# Patient Record
Sex: Male | Born: 1955 | Race: White | Hispanic: No | Marital: Single | Smoking: Current every day smoker
Health system: Southern US, Community
[De-identification: ages and names within clinical notes are randomized; demographics above are authoritative.]

## PROBLEM LIST (undated history)

## (undated) DIAGNOSIS — I714 Abdominal aortic aneurysm, without rupture, unspecified: Secondary | ICD-10-CM

## (undated) DIAGNOSIS — C649 Malignant neoplasm of unspecified kidney, except renal pelvis: Secondary | ICD-10-CM

## (undated) DIAGNOSIS — Z951 Presence of aortocoronary bypass graft: Secondary | ICD-10-CM

## (undated) DIAGNOSIS — F329 Major depressive disorder, single episode, unspecified: Secondary | ICD-10-CM

## (undated) DIAGNOSIS — I10 Essential (primary) hypertension: Secondary | ICD-10-CM

## (undated) DIAGNOSIS — Z95810 Presence of automatic (implantable) cardiac defibrillator: Secondary | ICD-10-CM

## (undated) DIAGNOSIS — N529 Male erectile dysfunction, unspecified: Secondary | ICD-10-CM

## (undated) DIAGNOSIS — J189 Pneumonia, unspecified organism: Secondary | ICD-10-CM

## (undated) DIAGNOSIS — F32A Depression, unspecified: Secondary | ICD-10-CM

## (undated) DIAGNOSIS — K219 Gastro-esophageal reflux disease without esophagitis: Secondary | ICD-10-CM

## (undated) DIAGNOSIS — I77810 Thoracic aortic ectasia: Secondary | ICD-10-CM

## (undated) DIAGNOSIS — I951 Orthostatic hypotension: Secondary | ICD-10-CM

## (undated) DIAGNOSIS — R0600 Dyspnea, unspecified: Secondary | ICD-10-CM

## (undated) DIAGNOSIS — I351 Nonrheumatic aortic (valve) insufficiency: Secondary | ICD-10-CM

## (undated) DIAGNOSIS — I255 Ischemic cardiomyopathy: Secondary | ICD-10-CM

## (undated) DIAGNOSIS — I472 Ventricular tachycardia, unspecified: Secondary | ICD-10-CM

## (undated) DIAGNOSIS — E785 Hyperlipidemia, unspecified: Secondary | ICD-10-CM

## (undated) DIAGNOSIS — M1990 Unspecified osteoarthritis, unspecified site: Secondary | ICD-10-CM

## (undated) DIAGNOSIS — M75100 Unspecified rotator cuff tear or rupture of unspecified shoulder, not specified as traumatic: Secondary | ICD-10-CM

## (undated) DIAGNOSIS — F419 Anxiety disorder, unspecified: Secondary | ICD-10-CM

## (undated) DIAGNOSIS — I5042 Chronic combined systolic (congestive) and diastolic (congestive) heart failure: Secondary | ICD-10-CM

## (undated) DIAGNOSIS — I2510 Atherosclerotic heart disease of native coronary artery without angina pectoris: Secondary | ICD-10-CM

## (undated) DIAGNOSIS — R519 Headache, unspecified: Secondary | ICD-10-CM

## (undated) DIAGNOSIS — R51 Headache: Secondary | ICD-10-CM

## (undated) DIAGNOSIS — G2581 Restless legs syndrome: Secondary | ICD-10-CM

## (undated) HISTORY — PX: COLONOSCOPY: SHX174

## (undated) HISTORY — DX: Thoracic aortic ectasia: I77.810

## (undated) HISTORY — DX: Unspecified rotator cuff tear or rupture of unspecified shoulder, not specified as traumatic: M75.100

## (undated) HISTORY — DX: Major depressive disorder, single episode, unspecified: F32.9

## (undated) HISTORY — DX: Malignant neoplasm of unspecified kidney, except renal pelvis: C64.9

## (undated) HISTORY — DX: Ischemic cardiomyopathy: I25.5

## (undated) HISTORY — PX: CARDIAC CATHETERIZATION: SHX172

## (undated) HISTORY — DX: Nonrheumatic aortic (valve) insufficiency: I35.1

## (undated) HISTORY — DX: Atherosclerotic heart disease of native coronary artery without angina pectoris: I25.10

## (undated) HISTORY — DX: Abdominal aortic aneurysm, without rupture, unspecified: I71.40

## (undated) HISTORY — PX: OTHER SURGICAL HISTORY: SHX169

## (undated) HISTORY — DX: Presence of aortocoronary bypass graft: Z95.1

## (undated) HISTORY — DX: Male erectile dysfunction, unspecified: N52.9

## (undated) HISTORY — DX: Ventricular tachycardia: I47.2

## (undated) HISTORY — DX: Depression, unspecified: F32.A

## (undated) HISTORY — PX: ROTATOR CUFF REPAIR: SHX139

## (undated) HISTORY — DX: Chronic combined systolic (congestive) and diastolic (congestive) heart failure: I50.42

## (undated) HISTORY — DX: Abdominal aortic aneurysm, without rupture: I71.4

## (undated) HISTORY — DX: Orthostatic hypotension: I95.1

## (undated) HISTORY — PX: CORONARY STENT PLACEMENT: SHX1402

## (undated) HISTORY — DX: Ventricular tachycardia, unspecified: I47.20

## (undated) HISTORY — DX: Hyperlipidemia, unspecified: E78.5

---

## 2003-01-15 HISTORY — PX: CORONARY ARTERY BYPASS GRAFT: SHX141

## 2010-10-19 ENCOUNTER — Emergency Department (HOSPITAL_COMMUNITY)
Admission: EM | Admit: 2010-10-19 | Discharge: 2010-10-20 | Disposition: A | Discharge status: Home / Self Care | Payer: Federal, State, Local not specified - PPO | Attending: Emergency Medicine | Admitting: Emergency Medicine

## 2010-10-19 DIAGNOSIS — Z951 Presence of aortocoronary bypass graft: Secondary | ICD-10-CM | POA: Insufficient documentation

## 2010-10-19 DIAGNOSIS — Z79899 Other long term (current) drug therapy: Secondary | ICD-10-CM | POA: Insufficient documentation

## 2010-10-19 DIAGNOSIS — Z760 Encounter for issue of repeat prescription: Secondary | ICD-10-CM | POA: Insufficient documentation

## 2010-10-19 DIAGNOSIS — F3289 Other specified depressive episodes: Secondary | ICD-10-CM | POA: Insufficient documentation

## 2010-10-19 DIAGNOSIS — F329 Major depressive disorder, single episode, unspecified: Secondary | ICD-10-CM | POA: Insufficient documentation

## 2010-10-20 LAB — POCT I-STAT, CHEM 8
BUN: 14 mg/dL (ref 6–23)
Chloride: 104 mEq/L (ref 96–112)
Creatinine, Ser: 1 mg/dL (ref 0.50–1.35)
Potassium: 4.1 mEq/L (ref 3.5–5.1)
Sodium: 140 mEq/L (ref 135–145)

## 2010-10-20 LAB — DIFFERENTIAL
Lymphocytes Relative: 22 % (ref 12–46)
Monocytes Absolute: 0.8 10*3/uL (ref 0.1–1.0)
Monocytes Relative: 8 % (ref 3–12)
Neutro Abs: 7.2 10*3/uL (ref 1.7–7.7)
Neutrophils Relative %: 68 % (ref 43–77)

## 2010-10-20 LAB — CBC
HCT: 49.9 % (ref 39.0–52.0)
Hemoglobin: 17.4 g/dL — ABNORMAL HIGH (ref 13.0–17.0)
MCH: 30.8 pg (ref 26.0–34.0)
MCHC: 34.9 g/dL (ref 30.0–36.0)
RBC: 5.65 MIL/uL (ref 4.22–5.81)

## 2012-09-22 ENCOUNTER — Telehealth: Payer: Self-pay | Admitting: Oncology

## 2012-09-22 NOTE — Telephone Encounter (Signed)
LVOM FOR PT TO RETURN CALL IN RE TO REFERRAL.  °

## 2012-10-05 ENCOUNTER — Encounter: Payer: Self-pay | Admitting: *Deleted

## 2012-10-05 ENCOUNTER — Encounter: Payer: Self-pay | Admitting: Cardiology

## 2012-10-05 ENCOUNTER — Ambulatory Visit: Payer: Federal, State, Local not specified - PPO | Admitting: Internal Medicine

## 2012-10-05 DIAGNOSIS — F329 Major depressive disorder, single episode, unspecified: Secondary | ICD-10-CM | POA: Insufficient documentation

## 2012-10-05 DIAGNOSIS — F32A Depression, unspecified: Secondary | ICD-10-CM | POA: Insufficient documentation

## 2012-10-05 DIAGNOSIS — N529 Male erectile dysfunction, unspecified: Secondary | ICD-10-CM | POA: Insufficient documentation

## 2012-10-05 DIAGNOSIS — C649 Malignant neoplasm of unspecified kidney, except renal pelvis: Secondary | ICD-10-CM | POA: Insufficient documentation

## 2012-10-05 DIAGNOSIS — I2510 Atherosclerotic heart disease of native coronary artery without angina pectoris: Secondary | ICD-10-CM | POA: Insufficient documentation

## 2012-10-05 DIAGNOSIS — M75100 Unspecified rotator cuff tear or rupture of unspecified shoulder, not specified as traumatic: Secondary | ICD-10-CM | POA: Insufficient documentation

## 2012-10-05 DIAGNOSIS — E785 Hyperlipidemia, unspecified: Secondary | ICD-10-CM | POA: Insufficient documentation

## 2012-10-12 ENCOUNTER — Other Ambulatory Visit (HOSPITAL_COMMUNITY): Payer: Self-pay | Admitting: Cardiology

## 2012-10-12 DIAGNOSIS — I259 Chronic ischemic heart disease, unspecified: Secondary | ICD-10-CM

## 2012-10-15 ENCOUNTER — Encounter: Payer: Self-pay | Admitting: Cardiology

## 2012-10-15 ENCOUNTER — Ambulatory Visit (INDEPENDENT_AMBULATORY_CARE_PROVIDER_SITE_OTHER): Payer: Federal, State, Local not specified - PPO | Admitting: Cardiology

## 2012-10-15 ENCOUNTER — Other Ambulatory Visit (HOSPITAL_COMMUNITY): Payer: Self-pay | Admitting: Cardiology

## 2012-10-15 ENCOUNTER — Ambulatory Visit (HOSPITAL_COMMUNITY): Payer: Federal, State, Local not specified - PPO | Attending: Cardiology | Admitting: Radiology

## 2012-10-15 VITALS — BP 140/90 | HR 72 | Ht 68.0 in | Wt 188.0 lb

## 2012-10-15 DIAGNOSIS — I259 Chronic ischemic heart disease, unspecified: Secondary | ICD-10-CM

## 2012-10-15 DIAGNOSIS — I2589 Other forms of chronic ischemic heart disease: Secondary | ICD-10-CM

## 2012-10-15 DIAGNOSIS — I25810 Atherosclerosis of coronary artery bypass graft(s) without angina pectoris: Secondary | ICD-10-CM

## 2012-10-15 DIAGNOSIS — I079 Rheumatic tricuspid valve disease, unspecified: Secondary | ICD-10-CM | POA: Insufficient documentation

## 2012-10-15 DIAGNOSIS — I080 Rheumatic disorders of both mitral and aortic valves: Secondary | ICD-10-CM | POA: Insufficient documentation

## 2012-10-15 DIAGNOSIS — I2510 Atherosclerotic heart disease of native coronary artery without angina pectoris: Secondary | ICD-10-CM

## 2012-10-15 DIAGNOSIS — E785 Hyperlipidemia, unspecified: Secondary | ICD-10-CM | POA: Insufficient documentation

## 2012-10-15 DIAGNOSIS — I255 Ischemic cardiomyopathy: Secondary | ICD-10-CM

## 2012-10-15 DIAGNOSIS — R000 Tachycardia, unspecified: Secondary | ICD-10-CM | POA: Insufficient documentation

## 2012-10-15 MED ORDER — LISINOPRIL 10 MG PO TABS: 10.0000 mg | ORAL_TABLET | Freq: Every day | ORAL | Status: DC

## 2012-10-15 NOTE — Progress Notes (Signed)
Echocardiogram performed.  

## 2012-10-15 NOTE — Patient Instructions (Signed)
Your physician recommends that you return for lab work in: one week for a basic metabolic on 10/22/12.  Your physician recommends that you schedule a follow-up appointment in: 3 weeks with Dr. Mayford Knife

## 2012-10-15 NOTE — Progress Notes (Signed)
[address] [place], [place]  [address] Phone: [phone] Fax:  [phone]  Date:  10/15/2012   ID:  Hector Byrd, DOB 04-28-1955, MRN [id number]  PCP:  Boneta Lucks, NP  Cardiologist:  Armanda Magic, MD   History of Present Illness: Hector Byrd is a 57 y.o. male  who  presents today for followup of arm tingling with exertion. 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 He had been having chest pain and left arm tingling about 1-2 times weekly but that has resolved since going on the Amlodipine . He SOB has also significantly improved as well.   He  underwent nuclear stress test which showed a moderate perfusion defect in the inferior, lateral and apical walls with no ischemia.  EF was 24%.  He does not recall whether his EF was down in the past and we do not have any records from his Cardiologist.    Wt Readings from Last 3 Encounters:  No data found for Wt     Past Medical History  Diagnosis Date  . Erectile dysfunction   . CAD (coronary artery disease)     s/p stent x2 and then CABG 1996  . Rotator cuff tear   . Depression   . Dyslipidemia   . Cancer of kidney     Current Outpatient Prescriptions  Medication Sig Dispense Refill  . aspirin 81 MG tablet Take 81 mg by mouth daily.      Marland Kitchen atorvastatin (LIPITOR) 80 MG tablet Take 1 tablet by mouth daily.      . clonazePAM (KLONOPIN) 1 MG tablet Take 1 tablet by mouth daily.      Marland Kitchen lamoTRIgine (LAMICTAL) 100 MG tablet Take 1 tablet by mouth 2 (two) times daily.       Marland Kitchen venlafaxine XR (EFFEXOR-XR) 150 MG 24 hr capsule Take 1 capsule by mouth daily.       No current facility-administered medications for this visit.    Allergies:   Allergies not on file  Social History:  The patient  reports that he has been smoking.  He does not have any smokeless tobacco history on file.   Family History:  The patient's family history includes Cancer in an other family  member; Diabetes in an other family member.   ROS:  Please see the history of present illness.      All other systems reviewed and negative.   PHYSICAL EXAM: VS:  There were no vitals taken for this visit. Well nourished, well developed, in no acute distress HEENT: normal Neck: no JVD Cardiac:  normal S1, S2; RRR; no murmur Lungs:  clear to auscultation bilaterally, no wheezing, rhonchi or rales Abd: soft, nontender, no hepatomegaly Ext: no edema Skin: warm and dry Neuro:  CNs 2-12 intact, no focal abnormalities noted       ASSESSMENT AND PLAN:  1. ASCAD s/p PCI and then CABG in 1996   - continue ASA/amlodipine.  We are trying to avoid beta blockers due to erectile dysfunction. 2. Angina - resolved after starting Amlodipine 3. Dyslipidemia 4. 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  If his EF is  about the   same then I will try medical therapy for 3 months and reassess LVF.  If his EF has declined then I will   proceed with cardiac cath.  - check BMET in 1 week 5.  dyslipidemia  - continue Atorvasatin ( just started a few weeks ago)  - check a lipid panel with ALT in 6 weeks  Followup in 3 weeks  Signed, Armanda Magic, MD 10/15/2012 3:30 PM

## 2012-10-16 ENCOUNTER — Telehealth: Payer: Self-pay | Admitting: Cardiology

## 2012-10-16 NOTE — Telephone Encounter (Signed)
Received records on 10.3.14  from [facility] [place] [place] Cath from 8.13.04 9.26.05  Echo from 8.12.04 11.14.08 Nuc from 8.12.04 11.14.08  Placed records on Dr. Norris Cross desk on 10.3.14/dc

## 2012-10-19 ENCOUNTER — Telehealth: Payer: Self-pay | Admitting: General Surgery

## 2012-10-19 NOTE — Telephone Encounter (Signed)
LVM for pt to return call for ECHO result

## 2012-10-19 NOTE — Telephone Encounter (Signed)
Message copied by Nita Sells on Mon Oct 19, 2012  9:35 AM ------      Message from: Armanda Magic R      Created: Fri Oct 16, 2012  1:21 PM       Please let patient know that LVF is moderately reduced with  EF 30% and mildly leaky MV/AV.  Will await records from [facility] to assess what EF was before to determine further treatment ------

## 2012-10-22 ENCOUNTER — Ambulatory Visit: Payer: Federal, State, Local not specified - PPO | Admitting: Cardiology

## 2012-10-22 ENCOUNTER — Other Ambulatory Visit: Payer: Federal, State, Local not specified - PPO

## 2012-10-28 ENCOUNTER — Telehealth: Payer: Self-pay | Admitting: General Surgery

## 2012-10-28 ENCOUNTER — Telehealth: Payer: Self-pay | Admitting: Cardiology

## 2012-10-28 DIAGNOSIS — I2589 Other forms of chronic ischemic heart disease: Secondary | ICD-10-CM

## 2012-10-28 NOTE — Telephone Encounter (Signed)
Dr. Mayford Knife what DX code would you like me to use for Cardiac MRI

## 2012-10-28 NOTE — Telephone Encounter (Signed)
Message copied by Nita Sells on Wed Oct 28, 2012  3:26 PM ------      Message from: Quintella Reichert      Created: Mon Oct 19, 2012 10:11 AM       I have reviewed the medical records from [place].  He had an echo test in 2004 and 2005 showing an EF 35-40%, a nuclear stress test 2008 with EF 33%, nuclear stress test 2004 EF 29%, cath 2005 EF 40%.  His most recent echo showed an EF from 25-35% and nuclear stress test showed EF 29%.  Due to discrepancy in EF calculation please order a Cardiac MRI to determine an accurate EF to determined whether he needs a prophylactic AICD placed. ------

## 2012-10-28 NOTE — Telephone Encounter (Signed)
Message copied by Nita Sells on Wed Oct 28, 2012  3:06 PM ------      Message from: Quintella Reichert      Created: Mon Oct 19, 2012 10:11 AM       I have reviewed the medical records from [place].  He had an echo test in 2004 and 2005 showing an EF 35-40%, a nuclear stress test 2008 with EF 33%, nuclear stress test 2004 EF 29%, cath 2005 EF 40%.  His most recent echo showed an EF from 25-35% and nuclear stress test showed EF 29%.  Due to discrepancy in EF calculation please order a Cardiac MRI to determine an accurate EF to determined whether he needs a prophylactic AICD placed. ------

## 2012-10-28 NOTE — Telephone Encounter (Signed)
Follow Up  ° °Pt returned the call  °

## 2012-10-28 NOTE — Telephone Encounter (Signed)
Spoke with wife and she is aware

## 2012-10-28 NOTE — Progress Notes (Signed)
LVM for pt to return call

## 2012-10-28 NOTE — Telephone Encounter (Signed)
LVm for pt to call back

## 2012-10-28 NOTE — Telephone Encounter (Signed)
Ischemic dilated CM 414.8

## 2012-10-30 ENCOUNTER — Encounter: Payer: Self-pay | Admitting: Cardiology

## 2012-10-30 NOTE — Telephone Encounter (Signed)
Spoke to pt and explained the procedure and that it could be a possibility for pt to need a defibrillator.

## 2012-10-30 NOTE — Telephone Encounter (Signed)
Follow up    Pt needs a call back, he needs some more information about his test and device please.

## 2012-11-05 ENCOUNTER — Ambulatory Visit (INDEPENDENT_AMBULATORY_CARE_PROVIDER_SITE_OTHER): Payer: Federal, State, Local not specified - PPO | Admitting: Cardiology

## 2012-11-05 ENCOUNTER — Encounter: Payer: Self-pay | Admitting: Cardiology

## 2012-11-05 ENCOUNTER — Encounter: Payer: Self-pay | Admitting: General Surgery

## 2012-11-05 VITALS — BP 139/80 | HR 99 | Ht 68.0 in | Wt 193.0 lb

## 2012-11-05 DIAGNOSIS — E785 Hyperlipidemia, unspecified: Secondary | ICD-10-CM

## 2012-11-05 DIAGNOSIS — I2510 Atherosclerotic heart disease of native coronary artery without angina pectoris: Secondary | ICD-10-CM

## 2012-11-05 DIAGNOSIS — I2589 Other forms of chronic ischemic heart disease: Secondary | ICD-10-CM

## 2012-11-05 DIAGNOSIS — I255 Ischemic cardiomyopathy: Secondary | ICD-10-CM

## 2012-11-05 LAB — BASIC METABOLIC PANEL
CO2: 30 mEq/L (ref 19–32)
Calcium: 9.5 mg/dL (ref 8.4–10.5)
Chloride: 101 mEq/L (ref 96–112)
Creatinine, Ser: 1 mg/dL (ref 0.4–1.5)
Glucose, Bld: 89 mg/dL (ref 70–99)
Sodium: 139 mEq/L (ref 135–145)

## 2012-11-05 MED ORDER — CARVEDILOL 3.125 MG PO TABS: 3.1250 mg | ORAL_TABLET | Freq: Two times a day (BID) | ORAL | Status: DC

## 2012-11-05 NOTE — Patient Instructions (Addendum)
Start Coreg 3.125 BID We sent a rx in for you.  Please go to the lab today for your BMET panel  Your physician recommends that you schedule a follow-up appointment in: 3 months with Dr.Turner  We are going to Cancel your Cardiac MRI due to how long it would take to fit pt in.   Your physician has requested that you have a MUGA: A multigated acquisition (MUGA) scan is a test that looks at the chambers and blood vessels of the heart. Please see the CareNotes handout/brochure given to you today for further information. This will be done at [facility]. Please schedule today before you leave.

## 2012-11-05 NOTE — Progress Notes (Signed)
[address] [place], [place]  [address] Phone: [phone] Fax:  [phone]  Date:  11/05/2012   ID:  Hector Byrd, DOB [birth year], MRN [id number]  PCP:  Boneta Lucks, NP  Cardiologist:  Armanda Magic, MD     History of Present Illness: Hector Byrd is a 57 y.o. male who presents today for followup of arm tingling with exertion. 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 He had been having chest pain and left arm tingling about 1-2 times weekly but that had resolved since going on the Amlodipine . His SOB had also significantly improved as well. He underwent nuclear stress test which showed a moderate perfusion defect in the inferior, lateral and apical walls with no ischemia. EF was 24%. I obtained the old records from his prior Cardiologist He had an echo test in 2004 and 2005 showing an EF 35-40%, a nuclear stress test 2008 with EF 33%, nuclear stress test 2004 EF 29%, cath 2005 EF 40%. His most recent echo showed an EF from 25-35% and nuclear stress test showed EF 29%. Due to discrepancy in EF calculation a Cardiac MRI was ordered which he has not had done yet to assess an accurate EF to determine whether he needs a prophylactic AICD placed.   He is doing well.  He tolerates the lisinopril.  He denies any chest pain, SOB, DOE, LE edema, dizziness or syncope. ------        Wt Readings from Last 3 Encounters:  10/15/12 188 lb (85.276 kg)     Past Medical History  Diagnosis Date  . Erectile dysfunction   . CAD (coronary artery disease)     s/p stent x2 and then CABG 1996  . Rotator cuff tear   . Depression   . Dyslipidemia   . Cancer of kidney     Current Outpatient Prescriptions  Medication Sig Dispense Refill  . amLODipine (NORVASC) 2.5 MG tablet       . aspirin 81 MG tablet Take 81 mg by mouth daily.      Marland Kitchen atorvastatin (LIPITOR) 80 MG tablet Take 1 tablet by mouth daily.      . buprenorphine-naloxone  (SUBOXONE) 8-2 MG SUBL SL tablet Place under the tongue 3 (three) times daily.      . clonazePAM (KLONOPIN) 1 MG tablet Take 1 tablet by mouth daily.      Marland Kitchen lamoTRIgine (LAMICTAL) 100 MG tablet Take 1 tablet by mouth 2 (two) times daily.       Marland Kitchen lisinopril (PRINIVIL,ZESTRIL) 10 MG tablet Take 1 tablet (10 mg total) by mouth daily.  90 tablet  3  . NITROSTAT 0.4 MG SL tablet       . testosterone cypionate (DEPOTESTOTERONE CYPIONATE) 200 MG/ML injection Inject into the muscle every 14 (fourteen) days.      Marland Kitchen venlafaxine XR (EFFEXOR-XR) 150 MG 24 hr capsule Take 1 capsule by mouth 2 (two) times daily.        No current facility-administered medications for this visit.    Allergies:   Not on File  Social History:  The patient  reports that he has been smoking.  He does not have any smokeless tobacco history on file.   Family History:  The patient's family history includes Cancer in an other family member; Diabetes in an other family member.   ROS:  Please see the history of present illness.  All other systems reviewed and negative.   PHYSICAL EXAM: VS:  There were no vitals taken for this visit. Well nourished, well developed, in no acute distress HEENT: normal Neck: no JVD Cardiac:  normal S1, S2; RRR; no murmur Lungs:  clear to auscultation bilaterally, no wheezing, rhonchi or rales Abd: soft, nontender, no hepatomegaly Ext: no edema Skin: warm and dry Neuro:  CNs 2-12 intact, no focal abnormalities noted       ASSESSMENT AND PLAN:  1. Ischemic dilated CM - Euvolemic on exam  - continue ACE I  - He was going to have a cardiac MRI to assess EF but the system is down so I will get a MUGA  - start Coreg 3.125mg  BID 2.   CAD s/p stent x 2 then CABG - angina resolved after starting Amlodipine  - continue ASA/amlodipine 3.   Dyslipidemia  - continue Atorvastatin  Followup with me in 3 months  Signed, Armanda Magic, MD 11/05/2012 10:01 AM

## 2012-11-09 ENCOUNTER — Ambulatory Visit (HOSPITAL_COMMUNITY): Payer: Federal, State, Local not specified - PPO | Attending: Cardiovascular Disease | Admitting: Radiology

## 2012-11-09 DIAGNOSIS — I2510 Atherosclerotic heart disease of native coronary artery without angina pectoris: Secondary | ICD-10-CM

## 2012-11-09 DIAGNOSIS — I2589 Other forms of chronic ischemic heart disease: Secondary | ICD-10-CM

## 2012-11-09 DIAGNOSIS — I43 Cardiomyopathy in diseases classified elsewhere: Secondary | ICD-10-CM

## 2012-11-09 MED ORDER — TECHNETIUM TC 99M-LABELED RED BLOOD CELLS IV KIT
33.0000 | PACK | Freq: Once | INTRAVENOUS | Status: AC | PRN
  Administered 2012-11-09: 33 via INTRAVENOUS

## 2012-11-09 NOTE — Progress Notes (Signed)
Muga Study  Referring Provider:  Quintella Reichert, MD  Date of Procedure: 11/09/2012  Indication: Assess LVF for possible Defibrillator. IV started via Right Hand with # 22 g angio x 1 by C. Hasspacher,RN.Patient tolerated well. Muga Information:  The patient's red blood cells were labeled using the Ultra Tag method with 33.0 mci of Technetium 90m Pertechnetate.  The images were reconstructed in the Anterior, Lateral and Left Anterior oblique views.  Impression: Severe hypokinesis of the mid to distal inferior wall and apex.  Calculated Left ventricular ejection fraction is 30%.    Corky Crafts., MD, [facility]

## 2012-11-10 NOTE — Progress Notes (Signed)
Pt is aware of the results

## 2012-11-12 ENCOUNTER — Telehealth: Payer: Self-pay | Admitting: Cardiology

## 2012-11-12 NOTE — Telephone Encounter (Signed)
New Message  Patient would like results of test & blood work that was done.Please call patient

## 2012-11-12 NOTE — Telephone Encounter (Signed)
Called pt and made aware of lab work Hector Byrd is not been read yet.

## 2012-11-13 NOTE — Telephone Encounter (Signed)
Pt started have abdominal pain 2 days ago. He spoke to PCP Dr. Christell Constant. They stated it could be related but she wanted [place] to check in with [place]. He stopped taking all 4 of the new medications (amlodipine 2.5 MG, lisinopril 10 mg, carvedilol 3.125 BID, and( Atorvastatin 80 MG Dr. Manson Passey)) that Dr Mayford Knife put him on last night. It starts in his stomach area and radiates up into his chest. Pt states he has also been constipated badly on and off for the past 5-6 months. Pt did have a bowel yesterday but it did not give any relief. He feels the pain is a stabbing pain and it gets worse with deep breathes, sneezing, or coughing. Pain scale a 4-5. Pt wants to stay off those 4 for 24 hours to see if there is any relief. He verbalized understanding that he needs to go back on those pills once the 24 hours is up if he does not feel any relief of symptoms. If he feels relief he is going to stay off all 4 until he hears back from [place] on Monday. I advised pt that if the pain worsened to go to the ER. He agreed. To Dr. Mayford Knife to advise.

## 2012-11-13 NOTE — Telephone Encounter (Signed)
Unlikely to be due to the medications.  Please call first thing on Monday to find out how patient is doing and then we will decide on what meds to use

## 2012-11-13 NOTE — Telephone Encounter (Signed)
Follow Up:  Pt states he spoke w/ Danielle earlier.. Pt states he is trying to eliminate symptoms.. Pt states he is having abdominal pains and would like to know if it is relating to his heart. Please advise

## 2012-11-16 NOTE — Telephone Encounter (Signed)
LVM for pt to return call to let us know how he is feeling.

## 2012-11-16 NOTE — Telephone Encounter (Signed)
Pt stated he's feeling alittle better worse when eating and when he belches. Pt is still not on any of the 4 meds below.

## 2012-11-16 NOTE — Addendum Note (Signed)
Addended by: Domenic Polite on: 11/16/2012 09:42 AM   Modules accepted: Orders

## 2012-11-17 NOTE — Telephone Encounter (Signed)
Pt notified. Pt will try and get into to see his PCP asap.

## 2012-11-17 NOTE — Telephone Encounter (Signed)
Belching and stomach symptoms most likely not related to the cardiac meds and I would like him to go back on his heart meds.  Please find out if he still has a gallbladder

## 2012-11-17 NOTE — Telephone Encounter (Signed)
Pt will get back on all heart medications as well.

## 2012-11-17 NOTE — Telephone Encounter (Signed)
Hector Byrd, pt is requesting results of his MUGA. Dr. Eldridge Dace read study. Is the study available?

## 2012-11-17 NOTE — Telephone Encounter (Signed)
lmom with info and for pt to call back asap

## 2012-11-17 NOTE — Telephone Encounter (Signed)
Pt wants to know the results of his MUGA test he had.

## 2012-11-17 NOTE — Telephone Encounter (Signed)
Dr. Eldridge Dace says that he read it but report not in system.  Please find out from [place] where report is

## 2012-11-17 NOTE — Telephone Encounter (Signed)
Please have him set up an appt with his PCP today

## 2012-11-19 ENCOUNTER — Telehealth: Payer: Self-pay | Admitting: General Surgery

## 2012-11-19 ENCOUNTER — Other Ambulatory Visit: Payer: Self-pay

## 2012-11-19 NOTE — Progress Notes (Signed)
Scan completed

## 2012-11-19 NOTE — Telephone Encounter (Signed)
Message copied by Nita Sells on Thu Nov 19, 2012  5:27 PM ------      Message from: Armanda Magic R      Created: Thu Nov 19, 2012  4:44 PM       Please let patient know that patient's MUGA scan showed an EF of 30% - please refer to EP for evaluation for AICD for primary prevention of SCD ------

## 2012-11-19 NOTE — Addendum Note (Signed)
Addended by: Milinda Antis on: 11/19/2012 02:33 PM   Modules accepted: Orders

## 2012-11-22 ENCOUNTER — Encounter (HOSPITAL_COMMUNITY): Payer: Self-pay | Admitting: Emergency Medicine

## 2012-11-22 ENCOUNTER — Emergency Department (HOSPITAL_COMMUNITY): Payer: Federal, State, Local not specified - PPO

## 2012-11-22 ENCOUNTER — Emergency Department (HOSPITAL_COMMUNITY)
Admission: EM | Admit: 2012-11-22 | Discharge: 2012-11-22 | Disposition: A | Discharge status: Home / Self Care | Payer: Federal, State, Local not specified - PPO | Attending: Emergency Medicine | Admitting: Emergency Medicine

## 2012-11-22 DIAGNOSIS — F3289 Other specified depressive episodes: Secondary | ICD-10-CM | POA: Insufficient documentation

## 2012-11-22 DIAGNOSIS — R1013 Epigastric pain: Secondary | ICD-10-CM | POA: Insufficient documentation

## 2012-11-22 DIAGNOSIS — Z85528 Personal history of other malignant neoplasm of kidney: Secondary | ICD-10-CM | POA: Insufficient documentation

## 2012-11-22 DIAGNOSIS — E785 Hyperlipidemia, unspecified: Secondary | ICD-10-CM | POA: Insufficient documentation

## 2012-11-22 DIAGNOSIS — R109 Unspecified abdominal pain: Secondary | ICD-10-CM

## 2012-11-22 DIAGNOSIS — Z79899 Other long term (current) drug therapy: Secondary | ICD-10-CM | POA: Insufficient documentation

## 2012-11-22 DIAGNOSIS — Z87828 Personal history of other (healed) physical injury and trauma: Secondary | ICD-10-CM | POA: Insufficient documentation

## 2012-11-22 DIAGNOSIS — Z9861 Coronary angioplasty status: Secondary | ICD-10-CM | POA: Insufficient documentation

## 2012-11-22 DIAGNOSIS — Z7982 Long term (current) use of aspirin: Secondary | ICD-10-CM | POA: Insufficient documentation

## 2012-11-22 DIAGNOSIS — Z951 Presence of aortocoronary bypass graft: Secondary | ICD-10-CM | POA: Insufficient documentation

## 2012-11-22 DIAGNOSIS — F17200 Nicotine dependence, unspecified, uncomplicated: Secondary | ICD-10-CM | POA: Insufficient documentation

## 2012-11-22 DIAGNOSIS — I2510 Atherosclerotic heart disease of native coronary artery without angina pectoris: Secondary | ICD-10-CM | POA: Insufficient documentation

## 2012-11-22 DIAGNOSIS — Z9889 Other specified postprocedural states: Secondary | ICD-10-CM | POA: Insufficient documentation

## 2012-11-22 DIAGNOSIS — Z87448 Personal history of other diseases of urinary system: Secondary | ICD-10-CM | POA: Insufficient documentation

## 2012-11-22 DIAGNOSIS — F329 Major depressive disorder, single episode, unspecified: Secondary | ICD-10-CM | POA: Insufficient documentation

## 2012-11-22 LAB — COMPREHENSIVE METABOLIC PANEL
Albumin: 3.9 g/dL (ref 3.5–5.2)
Alkaline Phosphatase: 178 U/L — ABNORMAL HIGH (ref 39–117)
BUN: 9 mg/dL (ref 6–23)
Calcium: 9.5 mg/dL (ref 8.4–10.5)
Creatinine, Ser: 0.98 mg/dL (ref 0.50–1.35)
GFR calc Af Amer: >90 mL/min (ref >90)
Glucose, Bld: 87 mg/dL (ref 70–99)
Potassium: 3.7 mEq/L (ref 3.5–5.1)
Total Protein: 7.5 g/dL (ref 6.0–8.3)

## 2012-11-22 LAB — CBC WITH DIFFERENTIAL/PLATELET
Basophils Relative: 0 % (ref 0–1)
Eosinophils Absolute: 0.5 10*3/uL (ref 0.0–0.7)
Eosinophils Relative: 5 % (ref 0–5)
Hemoglobin: 17.3 g/dL — ABNORMAL HIGH (ref 13.0–17.0)
Lymphs Abs: 2.1 10*3/uL (ref 0.7–4.0)
MCH: 31.2 pg (ref 26.0–34.0)
MCHC: 35.5 g/dL (ref 30.0–36.0)
MCV: 87.9 fL (ref 78.0–100.0)
Monocytes Relative: 9 % (ref 3–12)
Neutrophils Relative %: 63 % (ref 43–77)
RBC: 5.55 MIL/uL (ref 4.22–5.81)

## 2012-11-22 LAB — URINALYSIS, ROUTINE W REFLEX MICROSCOPIC
Bilirubin Urine: NEGATIVE
Glucose, UA: NEGATIVE mg/dL
Hgb urine dipstick: NEGATIVE
Ketones, ur: NEGATIVE mg/dL
Protein, ur: NEGATIVE mg/dL
Urobilinogen, UA: 1 mg/dL (ref 0.0–1.0)

## 2012-11-22 LAB — POCT I-STAT TROPONIN I

## 2012-11-22 LAB — URINE MICROSCOPIC-ADD ON

## 2012-11-22 MED ORDER — IOHEXOL 300 MG/ML  SOLN
100.0000 mL | Freq: Once | INTRAMUSCULAR | Status: AC | PRN
  Administered 2012-11-22: 100 mL via INTRAVENOUS

## 2012-11-22 MED ORDER — ONDANSETRON HCL 4 MG PO TABS: 4.0000 mg | ORAL_TABLET | Freq: Four times a day (QID) | ORAL | Status: DC

## 2012-11-22 MED ORDER — IOHEXOL 300 MG/ML  SOLN
25.0000 mL | Freq: Once | INTRAMUSCULAR | Status: AC | PRN
  Administered 2012-11-22: 25 mL via ORAL

## 2012-11-22 MED ORDER — GI COCKTAIL ~~LOC~~: 30.0000 mL | Freq: Once | ORAL | Status: DC

## 2012-11-22 NOTE — ED Notes (Signed)
Transported to XR  

## 2012-11-22 NOTE — ED Notes (Signed)
Pt c/o upper abd pain onset 2 weeks ago. Reports the pain has been getting worse since onset and he has had some nausea. Pt reports last bowel movement was approx 1 week ago. States is currently being followed by cardiology for possible AICD placement and they instructed pt to come to ed for evaluation of the upper abd pain

## 2012-11-22 NOTE — ED Provider Notes (Signed)
CSN: [id number]     Arrival date & time 11/22/12  1746 History   First MD Initiated Contact with Patient 11/22/12 1825     Chief Complaint  Patient presents with  . Abdominal Pain   (Consider location/radiation/quality/duration/timing/severity/associated sxs/prior Treatment) HPI Onset was gradual about two weeks ago, constant.  The pain is mild, currently 2/10, located to epigastric area, described as tightness. Modifying factors: worse with movement.  Associated symptoms: no chest pain, no SOB, no emesis, pt with constipation.  Recent medical care: recently seen in cardiology office for possible ICD placement.   Past Medical History  Diagnosis Date  . Erectile dysfunction   . CAD (coronary artery disease)     s/p stent x2 and then CABG 1996  . Rotator cuff tear   . Depression   . Dyslipidemia   . Cancer of kidney   . History of open heart surgery    Past Surgical History  Procedure Laterality Date  . Rotator cuff repair    . Coronary stent placement     Family History  Problem Relation Age of Onset  . Diabetes    . Cancer     History  Substance Use Topics  . Smoking status: Current Every Day Smoker    Types: Cigarettes  . Smokeless tobacco: Not on file  . Alcohol Use: No    Review of Systems Constitutional: Negative for fever.  Eyes: Negative for vision loss.  ENT: Negative for difficulty swallowing.  Cardiovascular: Negative for chest pain. Respiratory: Negative for respiratory distress.  Gastrointestinal:  Negative for vomiting.  Genitourinary: Negative for inability to void.  Musculoskeletal: Negative for gait problem.  Integumentary: Negative for rash.  Neurological: Negative for new focal weakness.     Allergies  Review of patient's allergies indicates no known allergies.  Home Medications   Current Outpatient Rx  Name  Route  Sig  Dispense  Refill  . amLODipine (NORVASC) 2.5 MG tablet   Oral   Take 2.5 mg by mouth daily at 6 (six) AM. bedtime         . aspirin EC 81 MG tablet   Oral   Take 81 mg by mouth daily at 12 noon.         Marland Kitchen atorvastatin (LIPITOR) 80 MG tablet   Oral   Take 80 mg by mouth daily at 12 noon.          . buprenorphine-naloxone (SUBOXONE) 8-2 MG SUBL SL tablet   Sublingual   Place 0.5-1 tablets under the tongue 4 (four) times daily. 1 tablet at noon, 1/2 tablet at 3pm and 7pm, 1 tablet at 11pm         . carvedilol (COREG) 3.125 MG tablet   Oral   Take 1 tablet (3.125 mg total) by mouth 2 (two) times daily.   60 tablet   11   . clonazePAM (KLONOPIN) 1 MG tablet   Oral   Take 0.5-1 mg by mouth 2 (two) times daily. 1 tablet at 6am (bedtime) and 1/2 tablet at noon         . lamoTRIgine (LAMICTAL) 100 MG tablet   Oral   Take 100 mg by mouth 2 (two) times daily. 6am and noon         . lisinopril (PRINIVIL,ZESTRIL) 10 MG tablet   Oral   Take 10 mg by mouth daily at 12 noon.         . naphazoline-pheniramine (NAPHCON-A) 0.025-0.3 % ophthalmic solution   Both Eyes  Place 1 drop into both eyes 2 (two) times daily.         . nitroGLYCERIN (NITROSTAT) 0.4 MG SL tablet   Sublingual   Place 0.4 mg under the tongue every 5 (five) minutes as needed for chest pain.         Marland Kitchen omeprazole (PRILOSEC) 20 MG capsule   Oral   Take 20 mg by mouth daily at 12 noon.         Marland Kitchen testosterone cypionate (DEPOTESTOTERONE CYPIONATE) 200 MG/ML injection   Intramuscular   Inject 200 mg into the muscle every 14 (fourteen) days.          Marland Kitchen venlafaxine XR (EFFEXOR-XR) 150 MG 24 hr capsule   Oral   Take 150 mg by mouth 2 (two) times daily. 6am and noon         . ondansetron (ZOFRAN) 4 MG tablet   Oral   Take 1 tablet (4 mg total) by mouth every 6 (six) hours.   12 tablet   0    BP 121/85  Pulse 71  Temp(Src) 98.6 F (37 C) (Oral)  Resp 19  Ht 5\' 8"  (1.727 m)  Wt 185 lb (83.915 kg)  BMI 28.14 kg/m2  SpO2 91% Physical Exam Nursing note and vitals reviewed.  Constitutional: Pt is alert and  appears stated age. Eyes: No injection, no scleral icterus. HENT: Atraumatic, airway open without erythema or exudate.  Respiratory: No respiratory distress. Equal breathing bilaterally. Cardiovascular: Normal rate. Extremities warm and well perfused.  Abdomen: Soft, non-distended, mild upper abdominal pain, no rebound or guarding. MSK: Extremities are atraumatic without deformity. Skin: No rash, no wounds.   Neuro: No motor nor sensory deficit.     ED Course  Procedures (including critical care time) Labs Review Labs Reviewed  CBC WITH DIFFERENTIAL - Abnormal; Notable for the following:    Hemoglobin 17.3 (*)    All other components within normal limits  COMPREHENSIVE METABOLIC PANEL - Abnormal; Notable for the following:    Alkaline Phosphatase 178 (*)    GFR calc non Af Amer 88 (*)    All other components within normal limits  URINALYSIS, ROUTINE W REFLEX MICROSCOPIC - Abnormal; Notable for the following:    Leukocytes, UA SMALL (*)    All other components within normal limits  LIPASE, BLOOD  URINE MICROSCOPIC-ADD ON  POCT I-STAT TROPONIN I   Imaging Review Dg Chest 2 View  11/22/2012   CLINICAL DATA:  Epigastric pain.  EXAM: CHEST  2 VIEW  COMPARISON:  None.  FINDINGS: The cardiac silhouette, mediastinal and hilar contours are within normal limits. Stable surgical changes related to bypass surgery. No acute pulmonary findings. No pleural effusion. The bony thorax is intact.  IMPRESSION: No acute cardiopulmonary findings.   Electronically Signed   By: Loralie Champagne M.D.   On: 11/22/2012 19:34   Ct Abdomen Pelvis W Contrast  11/22/2012   CLINICAL DATA:  Upper abdominal pain and nausea.  EXAM: CT ABDOMEN AND PELVIS WITH CONTRAST  TECHNIQUE: Multidetector CT imaging of the abdomen and pelvis was performed using the standard protocol following bolus administration of intravenous contrast.  CONTRAST:  OMNIPAQUE IOHEXOL 300 MG/ML  SOLN  COMPARISON:  None.  FINDINGS: Minimal  left basilar atelectasis is noted. The patient is status post median sternotomy. Diffuse coronary calcifications are seen.  The liver is diffusely heterogeneous; this may reflect diffuse fatty infiltration, with peripheral sparing. A nonspecific 2.3 cm hypodensity within the spleen may reflect a  cyst but is not well characterized. The gallbladder is within normal limits. The pancreas and adrenal glands are unremarkable.  Scarring is noted at the interpole region of the right kidney. Nonspecific perinephric stranding is noted bilaterally. The kidneys are otherwise unremarkable in appearance. There is no evidence of hydronephrosis. No renal or ureteral stones are seen.  No free fluid is identified. The small bowel is unremarkable in appearance. The stomach is within normal limits. No acute vascular abnormalities are seen. There is minimal ectasia of the distal abdominal aorta, without evidence of aneurysmal dilatation. Mild scattered calcification is noted along the abdominal aorta and its branches. Incidental note is made of a circumaortic left renal vein.  The appendix is normal in caliber, without evidence for appendicitis. The colon is unremarkable in appearance.  The bladder is mildly distended; diffuse bladder wall thickening may reflect chronic inflammation and relative decompression. The prostate is mildly enlarged, measuring 5.3 cm in transverse dimension. No inguinal lymphadenopathy is seen.  No acute osseous abnormalities are identified. Mild degenerative change is noted along the lumbar spine.  IMPRESSION: 1. No acute abnormality seen to explain the patient's symptoms. 2. Nonspecific 2.3 cm hypodensity within the spleen may reflect a cyst but is not well characterized. Depending on the degree of clinical concern, further evaluation could be considered on an elective non-emergent basis, as deemed clinically appropriate. 3. Diffuse heterogeneity of the liver may reflect diffuse fatty infiltration, with  peripheral sparing. 4. Diffuse coronary calcifications seen. 5. Scarring at the interpole region of the right kidney. 6. Mild scattered calcification along the abdominal aorta and its branches. 7. Mildly enlarged prostate. 8. Diffuse bladder wall thickening may reflect chronic inflammation. 9. Incidental note made of a circumaortic left renal vein.   Electronically Signed   By: Roanna Raider M.D.   On: 11/22/2012 21:21    EKG Interpretation   None       MDM   1. Abdominal pain    57 y.o. male w/ PMHx of significant cardiac disease presents w/ upper abdominal pain for two weeks. Pt looks well. Normal vitals, soft abdominal exam. Not c/w cardiac disease. Unremarkable EKG, neg troponin. Lipase without pancreatitis. CMP without renal or liver failure. CBC without leukocytosis or anemia. UA without obvious signs of infection. Pt offered treatment for discomfort but declined. Given likely upcoming procedure CT abd/pelvis ordered and did not have acute findings. Several incidentals found. Pt informed of results, doing well on re-eval. Appropriate for d/c home, close f/u. Counseling provided regarding diagnosis, treatment plan, follow up recommendations, and return precautions. Questions answered.       I independently viewed, interpreted, and used in my medical decision making all ordered lab and imaging tests. Medical Decision Making discussed with ED attending Dr. Redgie Grayer.      Charm Barges, MD 11/23/12 [phone]

## 2012-11-22 NOTE — ED Notes (Signed)
Pt has returned from CT at this time

## 2012-11-22 NOTE — ED Notes (Signed)
Pt currently in X-ray at this time

## 2012-11-23 NOTE — ED Provider Notes (Signed)
I saw and evaluated the patient, reviewed the resident's note and I agree with the findings and plan.  Lengthy ER stay and thorough ER w/u.  VSS. Benign exam.  Negative evaluation.  F/u as outpt.  Doubt emergent etiology.  Pt agrees with plan.     Darlys Gales, MD 11/23/12 [phone]

## 2012-11-23 NOTE — Telephone Encounter (Signed)
Spoke with Pts significant other and explained Test results and they are aware.

## 2012-11-23 NOTE — Telephone Encounter (Signed)
Follow Up   Pt returning call for MUGA test. Would also like to discuss the out of the hospital visit from 11/10.// Please call to assist.

## 2012-11-24 ENCOUNTER — Telehealth: Payer: Self-pay | Admitting: *Deleted

## 2012-11-24 NOTE — Telephone Encounter (Signed)
Left message on voicemail for patient to confirm appt with Dr. Ladona Ridgel 11/26/12 @ 2:45.

## 2012-11-25 NOTE — Telephone Encounter (Signed)
Patient confirmed appointment.

## 2012-11-25 NOTE — Telephone Encounter (Signed)
LVM for pt to verify appt. Told him to call back if needed to r/s

## 2012-11-26 ENCOUNTER — Ambulatory Visit (INDEPENDENT_AMBULATORY_CARE_PROVIDER_SITE_OTHER): Payer: Federal, State, Local not specified - PPO | Admitting: Internal Medicine

## 2012-11-26 ENCOUNTER — Encounter: Payer: Self-pay | Admitting: Internal Medicine

## 2012-11-26 ENCOUNTER — Encounter: Payer: Self-pay | Admitting: *Deleted

## 2012-11-26 VITALS — BP 112/81 | HR 97 | Ht 68.0 in | Wt 189.0 lb

## 2012-11-26 DIAGNOSIS — I255 Ischemic cardiomyopathy: Secondary | ICD-10-CM

## 2012-11-26 DIAGNOSIS — I2589 Other forms of chronic ischemic heart disease: Secondary | ICD-10-CM

## 2012-11-26 NOTE — Assessment & Plan Note (Signed)
The patient has class II congestive heart failure symptoms and an ejection fraction of 30%. He is on maximal medical therapy. He has had long-standing severe left ventricular dysfunction. I discussed treatment options with the patient. The risk, goals, benefits, and expectations of ICD implantation have been discussed and he wishes to proceed. This will be scheduled next several weeks.

## 2012-11-26 NOTE — Progress Notes (Signed)
HPI Hector Byrd is referred for consideration of a prophylactic ICD. He is a very pleasant 57 year old man with a history of coronary artery disease status post myocardial infarction, status post bypass surgery in 1996. In the interim, he has had multiple percutaneous interventions. The patient has never had syncope. He is recently moved from [place] to [place] to be closer to family. He has class II heart failure symptoms. He is able walk at a modest most days but has trouble going up hills or inclines quickly. The patient has been on maximal medical therapy. He has had long-standing left ventricular dysfunction with an ejection fraction of 35% or less for the last 10 years. The patient had been on beta blockers in the past that developed erectile dysfunction and these were discontinued. He was recently started on low-dose carvedilol his primary cardiologist. He remains on lisinopril. He does have volume overload, and has not required daily Lasix. He denies frank syncope. He has had dizzy spells in the past which lasted seconds. No Known Allergies   Current Outpatient Prescriptions  Medication Sig Dispense Refill  . amLODipine (NORVASC) 2.5 MG tablet Take 2.5 mg by mouth daily at 6 (six) AM. bedtime      . aspirin EC 81 MG tablet Take 81 mg by mouth daily at 12 noon.      Marland Kitchen atorvastatin (LIPITOR) 80 MG tablet Take 80 mg by mouth daily at 12 noon.       . buprenorphine-naloxone (SUBOXONE) 8-2 MG SUBL SL tablet Place 0.5-1 tablets under the tongue 4 (four) times daily. 1 tablet at noon, 1/2 tablet at 3pm and 7pm, 1 tablet at 11pm      . carvedilol (COREG) 3.125 MG tablet Take 1 tablet (3.125 mg total) by mouth 2 (two) times daily.  60 tablet  11  . clonazePAM (KLONOPIN) 1 MG tablet Take 0.5-1 mg by mouth 2 (two) times daily. 1 tablet at 6am (bedtime) and 1/2 tablet at noon      . lamoTRIgine (LAMICTAL) 100 MG tablet Take 100 mg by mouth 2 (two) times daily. 6am and noon      . lisinopril  (PRINIVIL,ZESTRIL) 10 MG tablet Take 10 mg by mouth daily at 12 noon.      . naphazoline-pheniramine (NAPHCON-A) 0.025-0.3 % ophthalmic solution Place 1 drop into both eyes 2 (two) times daily.      . nitroGLYCERIN (NITROSTAT) 0.4 MG SL tablet Place 0.4 mg under the tongue every 5 (five) minutes as needed for chest pain.      Marland Kitchen omeprazole (PRILOSEC) 20 MG capsule Take 20 mg by mouth daily at 12 noon.      . ondansetron (ZOFRAN) 4 MG tablet Take 1 tablet (4 mg total) by mouth every 6 (six) hours.  12 tablet  0  . testosterone cypionate (DEPOTESTOTERONE CYPIONATE) 200 MG/ML injection Inject 200 mg into the muscle every 14 (fourteen) days.       Marland Kitchen venlafaxine XR (EFFEXOR-XR) 150 MG 24 hr capsule Take 150 mg by mouth 2 (two) times daily. 6am and noon       No current facility-administered medications for this visit.     Past Medical History  Diagnosis Date  . Erectile dysfunction   . CAD (coronary artery disease)     s/p stent x2 and then CABG 1996  . Rotator cuff tear   . Depression   . Dyslipidemia   . Cancer of kidney   . History of open heart  surgery     ROS:   All systems reviewed and negative except as noted in the HPI.   Past Surgical History  Procedure Laterality Date  . Rotator cuff repair    . Coronary stent placement       Family History  Problem Relation Age of Onset  . Diabetes    . Cancer       History   Social History  . Marital Status: Single    Spouse Name: N/A    Number of Children: N/A  . Years of Education: N/A   Occupational History  . Not on file.   Social History Main Topics  . Smoking status: Current Every Day Smoker    Types: Cigarettes  . Smokeless tobacco: Not on file  . Alcohol Use: No  . Drug Use: No  . Sexual Activity: Not on file   Other Topics Concern  . Not on file   Social History Narrative  . No narrative on file     BP 112/81  Pulse 97  Ht 5\' 8"  (1.727 m)  Wt 189 lb (85.73 kg)  BMI 28.74 kg/m2  Physical  Exam:  Well appearing 57 year old man, NAD HEENT: Unremarkable Neck:  No JVD, no thyromegally Back:  No CVA tenderness Lungs:  Clear with no wheezes, rales, or rhonchi. HEART:  Regular rate rhythm, no murmurs, no rubs, no clicks Abd:  soft, positive bowel sounds, no organomegally, no rebound, no guarding Ext:  2 plus pulses, no edema, no cyanosis, no clubbing Skin:  No rashes no nodules Neuro:  CN II through XII intact, motor grossly intact  EKG - normal sinus rhythm with a narrow QRS.  DEVICE  Normal device function.  See PaceArt for details.   Assess/Plan:

## 2012-11-26 NOTE — Patient Instructions (Signed)
Your physician has recommended that you have a defibrillator inserted. An implantable cardioverter defibrillator (ICD) is a small device that is placed in your chest or, in rare cases, your abdomen. This device uses electrical pulses or shocks to help control life-threatening, irregular heartbeats that could lead the heart to suddenly stop beating (sudden cardiac arrest). Leads are attached to the ICD that goes into your heart. This is done in the hospital and usually requires an overnight stay. Please see the instruction sheet given to you today for more information.  See instruction sheet 

## 2012-11-27 ENCOUNTER — Encounter (HOSPITAL_COMMUNITY): Payer: Self-pay | Admitting: Pharmacy Technician

## 2012-11-27 LAB — BASIC METABOLIC PANEL
CO2: 26 mEq/L (ref 19–32)
Chloride: 105 mEq/L (ref 96–112)
GFR: 79.23 mL/min (ref >60.00)
Potassium: 4.6 mEq/L (ref 3.5–5.1)
Sodium: 136 mEq/L (ref 135–145)

## 2012-11-27 LAB — CBC WITH DIFFERENTIAL/PLATELET
Basophils Relative: 0.4 % (ref 0.0–3.0)
Eosinophils Absolute: 0.5 10*3/uL (ref 0.0–0.7)
Eosinophils Relative: 4.9 % (ref 0.0–5.0)
HCT: 48.4 % (ref 39.0–52.0)
Hemoglobin: 16.6 g/dL (ref 13.0–17.0)
MCHC: 34.3 g/dL (ref 30.0–36.0)
MCV: 89 fl (ref 78.0–100.0)
Monocytes Absolute: 0.6 10*3/uL (ref 0.1–1.0)
Neutro Abs: 6.7 10*3/uL (ref 1.4–7.7)
RBC: 5.44 Mil/uL (ref 4.22–5.81)
WBC: 10.3 10*3/uL (ref 4.5–10.5)

## 2012-12-06 MED ORDER — CEFAZOLIN SODIUM-DEXTROSE 2-3 GM-% IV SOLR: 2.0000 g | INTRAVENOUS | Status: DC

## 2012-12-06 MED ORDER — GENTAMICIN SULFATE 40 MG/ML IJ SOLN
80.0000 mg | INTRAMUSCULAR | Status: DC
  Filled 2012-12-06: qty 2

## 2012-12-07 ENCOUNTER — Ambulatory Visit (HOSPITAL_COMMUNITY)
Admission: RE | Admit: 2012-12-07 | Discharge: 2012-12-08 | Disposition: A | Discharge status: Home / Self Care | Payer: Federal, State, Local not specified - PPO | Source: Ambulatory Visit | Attending: Internal Medicine | Admitting: Internal Medicine

## 2012-12-07 ENCOUNTER — Encounter (HOSPITAL_COMMUNITY)
Admission: RE | Disposition: A | Discharge status: Home / Self Care | Payer: Self-pay | Source: Ambulatory Visit | Attending: Internal Medicine

## 2012-12-07 ENCOUNTER — Encounter (HOSPITAL_COMMUNITY): Payer: Self-pay | Admitting: General Practice

## 2012-12-07 DIAGNOSIS — I252 Old myocardial infarction: Secondary | ICD-10-CM | POA: Insufficient documentation

## 2012-12-07 DIAGNOSIS — Z85528 Personal history of other malignant neoplasm of kidney: Secondary | ICD-10-CM | POA: Insufficient documentation

## 2012-12-07 DIAGNOSIS — Z951 Presence of aortocoronary bypass graft: Secondary | ICD-10-CM | POA: Insufficient documentation

## 2012-12-07 DIAGNOSIS — Z006 Encounter for examination for normal comparison and control in clinical research program: Secondary | ICD-10-CM | POA: Insufficient documentation

## 2012-12-07 DIAGNOSIS — E785 Hyperlipidemia, unspecified: Secondary | ICD-10-CM | POA: Insufficient documentation

## 2012-12-07 DIAGNOSIS — I2510 Atherosclerotic heart disease of native coronary artery without angina pectoris: Secondary | ICD-10-CM | POA: Insufficient documentation

## 2012-12-07 DIAGNOSIS — I255 Ischemic cardiomyopathy: Secondary | ICD-10-CM

## 2012-12-07 DIAGNOSIS — E8779 Other fluid overload: Secondary | ICD-10-CM | POA: Insufficient documentation

## 2012-12-07 DIAGNOSIS — I2589 Other forms of chronic ischemic heart disease: Secondary | ICD-10-CM

## 2012-12-07 DIAGNOSIS — F3289 Other specified depressive episodes: Secondary | ICD-10-CM | POA: Insufficient documentation

## 2012-12-07 DIAGNOSIS — F17200 Nicotine dependence, unspecified, uncomplicated: Secondary | ICD-10-CM | POA: Insufficient documentation

## 2012-12-07 DIAGNOSIS — F329 Major depressive disorder, single episode, unspecified: Secondary | ICD-10-CM | POA: Insufficient documentation

## 2012-12-07 DIAGNOSIS — I509 Heart failure, unspecified: Secondary | ICD-10-CM | POA: Insufficient documentation

## 2012-12-07 HISTORY — PX: IMPLANTABLE CARDIOVERTER DEFIBRILLATOR IMPLANT: SHX5860

## 2012-12-07 HISTORY — PX: IMPLANTABLE CARDIOVERTER DEFIBRILLATOR IMPLANT: SHX5473

## 2012-12-07 HISTORY — DX: Essential (primary) hypertension: I10

## 2012-12-07 HISTORY — DX: Gastro-esophageal reflux disease without esophagitis: K21.9

## 2012-12-07 LAB — SURGICAL PCR SCREEN: MRSA, PCR: NEGATIVE

## 2012-12-07 SURGERY — IMPLANTABLE CARDIOVERTER DEFIBRILLATOR IMPLANT
Anesthesia: LOCAL

## 2012-12-07 MED ORDER — LIDOCAINE HCL (PF) 1 % IJ SOLN
INTRAMUSCULAR | Status: AC
  Filled 2012-12-07: qty 30

## 2012-12-07 MED ORDER — TESTOSTERONE CYPIONATE 200 MG/ML IM SOLN
200.0000 mg | INTRAMUSCULAR | Status: DC
  Administered 2012-12-07: 200 mg via INTRAMUSCULAR
  Filled 2012-12-07: qty 1

## 2012-12-07 MED ORDER — CHLORHEXIDINE GLUCONATE 4 % EX LIQD
60.0000 mL | Freq: Once | CUTANEOUS | Status: DC
  Filled 2012-12-07: qty 60

## 2012-12-07 MED ORDER — MIDAZOLAM HCL 5 MG/5ML IJ SOLN
INTRAMUSCULAR | Status: AC
  Filled 2012-12-07: qty 5

## 2012-12-07 MED ORDER — CLONAZEPAM 0.5 MG PO TABS
0.5000 mg | ORAL_TABLET | Freq: Two times a day (BID) | ORAL | Status: DC
  Administered 2012-12-07 (×2): 0.5 mg via ORAL
  Filled 2012-12-07 (×2): qty 1

## 2012-12-07 MED ORDER — ACETAMINOPHEN 325 MG PO TABS
325.0000 mg | ORAL_TABLET | ORAL | Status: DC | PRN
  Administered 2012-12-07: 650 mg via ORAL
  Filled 2012-12-07: qty 2

## 2012-12-07 MED ORDER — ONDANSETRON HCL 4 MG/2ML IJ SOLN: 4.0000 mg | Freq: Four times a day (QID) | INTRAMUSCULAR | Status: DC | PRN

## 2012-12-07 MED ORDER — CARVEDILOL 3.125 MG PO TABS
3.1250 mg | ORAL_TABLET | Freq: Two times a day (BID) | ORAL | Status: DC
  Administered 2012-12-07: 3.125 mg via ORAL
  Filled 2012-12-07 (×4): qty 1

## 2012-12-07 MED ORDER — FENTANYL CITRATE 0.05 MG/ML IJ SOLN
INTRAMUSCULAR | Status: AC
  Filled 2012-12-07: qty 2

## 2012-12-07 MED ORDER — LAMOTRIGINE 100 MG PO TABS
100.0000 mg | ORAL_TABLET | Freq: Two times a day (BID) | ORAL | Status: DC
  Administered 2012-12-07: 100 mg via ORAL
  Filled 2012-12-07 (×4): qty 1

## 2012-12-07 MED ORDER — BUPRENORPHINE HCL 2 MG SL SUBL: 8.0000 mg | SUBLINGUAL_TABLET | SUBLINGUAL | Status: DC

## 2012-12-07 MED ORDER — AMLODIPINE BESYLATE 2.5 MG PO TABS
2.5000 mg | ORAL_TABLET | Freq: Every day | ORAL | Status: DC
  Administered 2012-12-08: 2.5 mg via ORAL
  Filled 2012-12-07 (×3): qty 1

## 2012-12-07 MED ORDER — ASPIRIN EC 81 MG PO TBEC
81.0000 mg | DELAYED_RELEASE_TABLET | Freq: Every day | ORAL | Status: DC
  Filled 2012-12-07 (×2): qty 1

## 2012-12-07 MED ORDER — BUPRENORPHINE HCL-NALOXONE HCL 8-2 MG SL SUBL: 0.5000 | SUBLINGUAL_TABLET | Freq: Four times a day (QID) | SUBLINGUAL | Status: DC

## 2012-12-07 MED ORDER — BUPRENORPHINE HCL 2 MG SL SUBL
4.0000 mg | SUBLINGUAL_TABLET | SUBLINGUAL | Status: DC
  Administered 2012-12-07: 4 mg via SUBLINGUAL
  Filled 2012-12-07: qty 2

## 2012-12-07 MED ORDER — PNEUMOCOCCAL VAC POLYVALENT 25 MCG/0.5ML IJ INJ
0.5000 mL | INJECTION | INTRAMUSCULAR | Status: DC
  Filled 2012-12-07: qty 0.5

## 2012-12-07 MED ORDER — CEFAZOLIN SODIUM-DEXTROSE 2-3 GM-% IV SOLR
INTRAVENOUS | Status: AC
  Filled 2012-12-07: qty 50

## 2012-12-07 MED ORDER — VENLAFAXINE HCL ER 150 MG PO CP24
150.0000 mg | ORAL_CAPSULE | Freq: Two times a day (BID) | ORAL | Status: DC
  Administered 2012-12-07: 150 mg via ORAL
  Filled 2012-12-07 (×4): qty 1

## 2012-12-07 MED ORDER — HEPARIN (PORCINE) IN NACL 2-0.9 UNIT/ML-% IJ SOLN
INTRAMUSCULAR | Status: AC
  Filled 2012-12-07: qty 500

## 2012-12-07 MED ORDER — MUPIROCIN 2 % EX OINT
TOPICAL_OINTMENT | Freq: Two times a day (BID) | CUTANEOUS | Status: DC
  Administered 2012-12-07: 08:00:00 via NASAL
  Filled 2012-12-07: qty 22

## 2012-12-07 MED ORDER — LISINOPRIL 10 MG PO TABS
10.0000 mg | ORAL_TABLET | Freq: Every day | ORAL | Status: DC
  Filled 2012-12-07 (×2): qty 1

## 2012-12-07 MED ORDER — MUPIROCIN 2 % EX OINT
TOPICAL_OINTMENT | CUTANEOUS | Status: AC
  Filled 2012-12-07: qty 22

## 2012-12-07 MED ORDER — NITROGLYCERIN 0.4 MG SL SUBL: 0.4000 mg | SUBLINGUAL_TABLET | SUBLINGUAL | Status: DC | PRN

## 2012-12-07 MED ORDER — SODIUM CHLORIDE 0.9 % IV SOLN
INTRAVENOUS | Status: DC
  Administered 2012-12-07: 08:00:00 via INTRAVENOUS

## 2012-12-07 MED ORDER — ATORVASTATIN CALCIUM 80 MG PO TABS
80.0000 mg | ORAL_TABLET | Freq: Every day | ORAL | Status: DC
Start: 2012-12-07 — End: 2012-12-08
  Filled 2012-12-07 (×2): qty 1

## 2012-12-07 MED ORDER — ONDANSETRON HCL 4 MG PO TABS
4.0000 mg | ORAL_TABLET | Freq: Four times a day (QID) | ORAL | Status: DC
  Filled 2012-12-07 (×5): qty 1

## 2012-12-07 MED ORDER — NAPHAZOLINE-PHENIRAMINE 0.025-0.3 % OP SOLN
1.0000 [drp] | Freq: Two times a day (BID) | OPHTHALMIC | Status: DC
  Administered 2012-12-07: 1 [drp] via OPHTHALMIC
  Filled 2012-12-07: qty 15

## 2012-12-07 MED ORDER — CEFAZOLIN SODIUM 1-5 GM-% IV SOLN
1.0000 g | Freq: Four times a day (QID) | INTRAVENOUS | Status: AC
  Administered 2012-12-07 – 2012-12-08 (×3): 1 g via INTRAVENOUS
  Filled 2012-12-07 (×3): qty 50

## 2012-12-07 NOTE — Op Note (Signed)
NAMESYLVANUS, Hector Byrd                 ACCOUNT NO.:  [id number]  MEDICAL RECORD NO.:  [id number]  LOCATION:  [facility]                         FACILITY:  [facility]  PHYSICIAN:  Doylene Canning. Ladona Ridgel, MD    DATE OF BIRTH:  Nov 18, 1955  DATE OF PROCEDURE:  12/07/2012 DATE OF DISCHARGE:                              OPERATIVE REPORT   PROCEDURE PERFORMED:  Insertion of a single chamber dual sensing ICD.  INDICATION:  Ischemic cardiomyopathy with ejection fraction of 30%, status post myocardial infarction, status post remote bypass surgery despite maximal medical therapy with class 2 heart failure.  INTRODUCTION:  The patient is a very pleasant 57 year old male with a history of chronic systolic heart failure, status post myocardial infarction.  He has an ischemic cardiomyopathy, with ejection fraction of 30%, and his heart failure symptoms are class 2.  The patient is now referred for ICD implantation for primary prevention of malignant ventricular arrhythmias.  DESCRIPTION OF PROCEDURE:  After informed consent was obtained, the patient was taken to the diagnostic EP lab in a fasting state.  After usual preparation and draping, intravenous fentanyl and midazolam was given for sedation.  A 30 mL of lidocaine was infiltrated into the left infraclavicular region and a 7-cm incision was carried out over this region and electrocautery was utilized to dissect down to the fascial plane.  The left subclavian vein was then punctured and the Biotronik Linox smart S DX 65 cm single coil active fixation dual sensing defibrillation lead was advanced into the right ventricle.  The serial number was [id number].  With the lead in satisfactory position, the mapping was then carried out in the final site on the RV apical septum, the R-waves measured 15 mV and the pacing impedance was 800 ohms with the lead actively fixed.  The threshold was initially 1.3 V at 0.4 milliseconds and the P-waves to the sensing portion of the  atrial lead of the atrial sensor rather were 3-4 mV.  The lead was actively fixed and the pacing and sensing parameters remained stable.  At this point, the lead was secured to the subpectoral fascia with a figure-of-eight silk suture and the sewing sleeve was secured with silk suture. Electrocautery was then utilized to make a subcutaneous pocket. Antibiotic irrigation was utilized to irrigate the pocket and electrocautery was utilized to assure hemostasis.  Biotronik single coil single chamber dual sensing ICD serial [id number] was connected to the defibrillation lead and placed back in the subcutaneous pocket.  Pacing and sensing through the device were satisfactory.  With all the above, the pocket was irrigated with antibiotic irrigation.  The incision was closed with 2-0 and 3-0 Vicryl.  At this point, I scrubbed out of the case to supervise defibrillation threshold testing.  After the patient more deeply sedated under my direct supervision with additional intravenous fentanyl and Versed, ventricular fibrillation was induced with a T-wave shock.  A 20-joule shock was then delivered through the device, restoring sinus rhythm.  No additional defibrillation threshold testing was carried out, and benzoin and Steri- Strips were painted on the skin.  A pressure dressing was applied and the patient was returned to his room in satisfactory condition.  COMPLICATIONS:  There were no immediate procedure complications.  RESULTS:  Demonstrate successful insertion of a Biotronik single-chamber defibrillator in a patient with an ischemic cardiomyopathy, chronic systolic heart failure, ejection fraction of 30%, status post remote myocardial infarction.     Doylene Canning. Ladona Ridgel, MD     GWT/MEDQ  D:  12/07/2012  T:  12/07/2012  Job:  [id number]

## 2012-12-07 NOTE — H&P (View-Only) (Signed)
    HPI Hector Byrd is referred for consideration of a prophylactic ICD. He is a very pleasant 57-year-old man with a history of coronary artery disease status post myocardial infarction, status post bypass surgery in 1996. In the interim, he has had multiple percutaneous interventions. The patient has never had syncope. He is recently moved from [place] to [place] to be closer to family. He has class II heart failure symptoms. He is able walk at a modest most days but has trouble going up hills or inclines quickly. The patient has been on maximal medical therapy. He has had long-standing left ventricular dysfunction with an ejection fraction of 35% or less for the last 10 years. The patient had been on beta blockers in the past that developed erectile dysfunction and these were discontinued. He was recently started on low-dose carvedilol his primary cardiologist. He remains on lisinopril. He does have volume overload, and has not required daily Lasix. He denies frank syncope. He has had dizzy spells in the past which lasted seconds. No Known Allergies   Current Outpatient Prescriptions  Medication Sig Dispense Refill  . amLODipine (NORVASC) 2.5 MG tablet Take 2.5 mg by mouth daily at 6 (six) AM. bedtime      . aspirin EC 81 MG tablet Take 81 mg by mouth daily at 12 noon.      . atorvastatin (LIPITOR) 80 MG tablet Take 80 mg by mouth daily at 12 noon.       . buprenorphine-naloxone (SUBOXONE) 8-2 MG SUBL SL tablet Place 0.5-1 tablets under the tongue 4 (four) times daily. 1 tablet at noon, 1/2 tablet at 3pm and 7pm, 1 tablet at 11pm      . carvedilol (COREG) 3.125 MG tablet Take 1 tablet (3.125 mg total) by mouth 2 (two) times daily.  60 tablet  11  . clonazePAM (KLONOPIN) 1 MG tablet Take 0.5-1 mg by mouth 2 (two) times daily. 1 tablet at 6am (bedtime) and 1/2 tablet at noon      . lamoTRIgine (LAMICTAL) 100 MG tablet Take 100 mg by mouth 2 (two) times daily. 6am and noon      . lisinopril  (PRINIVIL,ZESTRIL) 10 MG tablet Take 10 mg by mouth daily at 12 noon.      . naphazoline-pheniramine (NAPHCON-A) 0.025-0.3 % ophthalmic solution Place 1 drop into both eyes 2 (two) times daily.      . nitroGLYCERIN (NITROSTAT) 0.4 MG SL tablet Place 0.4 mg under the tongue every 5 (five) minutes as needed for chest pain.      . omeprazole (PRILOSEC) 20 MG capsule Take 20 mg by mouth daily at 12 noon.      . ondansetron (ZOFRAN) 4 MG tablet Take 1 tablet (4 mg total) by mouth every 6 (six) hours.  12 tablet  0  . testosterone cypionate (DEPOTESTOTERONE CYPIONATE) 200 MG/ML injection Inject 200 mg into the muscle every 14 (fourteen) days.       . venlafaxine XR (EFFEXOR-XR) 150 MG 24 hr capsule Take 150 mg by mouth 2 (two) times daily. 6am and noon       No current facility-administered medications for this visit.     Past Medical History  Diagnosis Date  . Erectile dysfunction   . CAD (coronary artery disease)     s/p stent x2 and then CABG 1996  . Rotator cuff tear   . Depression   . Dyslipidemia   . Cancer of kidney   . History of open heart   surgery     ROS:   All systems reviewed and negative except as noted in the HPI.   Past Surgical History  Procedure Laterality Date  . Rotator cuff repair    . Coronary stent placement       Family History  Problem Relation Age of Onset  . Diabetes    . Cancer       History   Social History  . Marital Status: Single    Spouse Name: N/A    Number of Children: N/A  . Years of Education: N/A   Occupational History  . Not on file.   Social History Main Topics  . Smoking status: Current Every Day Smoker    Types: Cigarettes  . Smokeless tobacco: Not on file  . Alcohol Use: No  . Drug Use: No  . Sexual Activity: Not on file   Other Topics Concern  . Not on file   Social History Narrative  . No narrative on file     BP 112/81  Pulse 97  Ht 5' 8" (1.727 m)  Wt 189 lb (85.73 kg)  BMI 28.74 kg/m2  Physical  Exam:  Well appearing 57-year-old man, NAD HEENT: Unremarkable Neck:  No JVD, no thyromegally Back:  No CVA tenderness Lungs:  Clear with no wheezes, rales, or rhonchi. HEART:  Regular rate rhythm, no murmurs, no rubs, no clicks Abd:  soft, positive bowel sounds, no organomegally, no rebound, no guarding Ext:  2 plus pulses, no edema, no cyanosis, no clubbing Skin:  No rashes no nodules Neuro:  CN II through XII intact, motor grossly intact  EKG - normal sinus rhythm with a narrow QRS.  DEVICE  Normal device function.  See PaceArt for details.   Assess/Plan: 

## 2012-12-07 NOTE — CV Procedure (Signed)
ICD implant via the left subclavian vein without immediate complication. [id number].

## 2012-12-07 NOTE — Interval H&P Note (Signed)
History and Physical Interval Note: Since prior clinic visit, no change in the history, physical exam, assessment and plan. 12/07/2012 9:29 AM  Hector Byrd  has presented today for surgery, with the diagnosis of ischemic cardiomyopathy  The various methods of treatment have been discussed with the patient and family. After consideration of risks, benefits and other options for treatment, the patient has consented to  Procedure(s): IMPLANTABLE CARDIOVERTER DEFIBRILLATOR IMPLANT (N/A) as a surgical intervention .  The patient's history has been reviewed, patient examined, no change in status, stable for surgery.  I have reviewed the patient's chart and labs.  Questions were answered to the patient's satisfaction.     Leonia Reeves.D.

## 2012-12-07 NOTE — Interval H&P Note (Signed)
ICD Criteria  Current LVEF (within 6 months):30%  NYHA Functional Classification: Class II  Heart Failure History:  Yes, Duration of heart failure since onset is > 9 months  Non-Ischemic Dilated Cardiomyopathy History:  No.  Atrial Fibrillation/Atrial Flutter:  No.  Ventricular Tachycardia History:  No.  Cardiac Arrest History:  No  History of Syndromes with Risk of Sudden Death:  No.  Previous ICD:  No.  Electrophysiology Study: No.  Anticoagulation Therapy:  Patient is NOT on anticoagulation therapy.   Beta Blocker Therapy:  Yes.   Ace Inhibitor/ARB Therapy:  Yes. History and Physical Interval Note:  12/07/2012 9:31 AM  Hector Byrd  has presented today for surgery, with the diagnosis of ischemic cardiomyopathy  The various methods of treatment have been discussed with the patient and family. After consideration of risks, benefits and other options for treatment, the patient has consented to  Procedure(s): IMPLANTABLE CARDIOVERTER DEFIBRILLATOR IMPLANT (N/A) as a surgical intervention .  The patient's history has been reviewed, patient examined, no change in status, stable for surgery.  I have reviewed the patient's chart and labs.  Questions were answered to the patient's satisfaction.     Lewayne Bunting

## 2012-12-07 NOTE — Progress Notes (Signed)
Received from EP holding area. Patient drowsy but responds to voice and follows commands. Denies pain or shortness of breath. Left subclavian dressing is dry/intact with no bleeding or hematoma noted. Arm sling noted to left arm. Post activity and precautions explained to patient and family. VSS Call bell near. Will continue to monitor.Hector Byrd

## 2012-12-08 ENCOUNTER — Ambulatory Visit (HOSPITAL_COMMUNITY): Payer: Federal, State, Local not specified - PPO

## 2012-12-08 DIAGNOSIS — I2589 Other forms of chronic ischemic heart disease: Secondary | ICD-10-CM

## 2012-12-08 NOTE — Progress Notes (Signed)
DC tele and IV per orders; discharge instructions reviewed and signed by patient; no prescriptions given; no further questions at this time.  BARNETT, Geroge Baseman

## 2012-12-08 NOTE — Discharge Summary (Signed)
ELECTROPHYSIOLOGY PROCEDURE DISCHARGE SUMMARY    Patient ID: Hector Byrd,  MRN: [id number], DOB/AGE: 16-Oct-1955 57 y.o.  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  Coronary artery disease - s/p CABG 2.  Depression 3.  Dyslipidemia  No Known Allergies   Procedures This Admission:  1.  Implantation of a Biotronik dual chamber ICD on 12-07-2012 by Dr Ladona Ridgel.  The patient received a Biotronik  ICD with model number Linox S DX right ventricular lead.  DFT's were successful at 20 J.  There were no immediate post procedure complications. 2.  CXR on 12-08-2012 demonstrated no pneumothorax status post device implantation.   Brief HPI: Hector Byrd is a 57 y.o. male was referred to electrophysiology in the outpatient setting for consideration of ICD implantation.  Past medical history includes coronary disease and dilated cardiomyopathy.  The patient has persistent LV dysfunction despite guideline directed therapy.  Risks, benefits, and alternatives to ICD implantation were reviewed with the patient who wished to proceed.   Hospital Course:  The patient was admitted and underwent implantation of a BTK ICD with details as outlined above.   He was monitored on telemetry overnight which demonstrated sinus rhythm.  Left chest was without hematoma or ecchymosis.  The device was interrogated and found to be functioning normally.  CXR was obtained and demonstrated no pneumothorax status post device implantation.  Wound care, arm mobility, and restrictions were reviewed with the patient.  Dr Ladona Ridgel examined the patient and considered them stable for discharge to home.   The patient's discharge medications include an ACE-I  (Lisinopril) and beta blocker (Coreg).   Discharge Vitals: Blood pressure 112/67, pulse 67, temperature 97.7 F (36.5 C), temperature source Oral, resp. rate 18, height 5\' 8"  (1.727 m), weight 188 lb 15 oz (85.7 kg), SpO2 95.00%.    Labs:   Lab Results  Component Value Date   WBC 10.3 11/26/2012   HGB 16.6 11/26/2012   HCT 48.4 11/26/2012   MCV 89.0 11/26/2012   PLT 334.0 11/26/2012     Discharge Medications:    Medication List         amLODipine 2.5 MG tablet  Commonly known as:  NORVASC  Take 2.5 mg by mouth daily at 6 (six) AM. bedtime     aspirin EC 81 MG tablet  Take 81 mg by mouth daily at 12 noon.     atorvastatin 80 MG tablet  Commonly known as:  LIPITOR  Take 80 mg by mouth daily at 12 noon.     buprenorphine-naloxone 8-2 MG Subl SL tablet  Commonly known as:  SUBOXONE  Place 0.5-1 tablets under the tongue 4 (four) times daily. 1 tablet at noon, 1/2 tablet at 3pm and 7pm, 1 tablet at 11pm     carvedilol 3.125 MG tablet  Commonly known as:  COREG  Take 1 tablet (3.125 mg total) by mouth 2 (two) times daily.     clonazePAM 1 MG tablet  Commonly known as:  KLONOPIN  Take 0.5-1 mg by mouth 2 (two) times daily. 1 tablet at 6am (bedtime) and 1/2 tablet at noon     lamoTRIgine 100 MG tablet  Commonly known as:  LAMICTAL  Take 100 mg by mouth 2 (two) times daily. 6am and noon  lisinopril 10 MG tablet  Commonly known as:  PRINIVIL,ZESTRIL  Take 10 mg by mouth daily at 12 noon.     naphazoline-pheniramine 0.025-0.3 % ophthalmic solution  Commonly known as:  NAPHCON-A  Place 1 drop into both eyes 2 (two) times daily.     nitroGLYCERIN 0.4 MG SL tablet  Commonly known as:  NITROSTAT  Place 0.4 mg under the tongue every 5 (five) minutes as needed for chest pain.     omeprazole 20 MG capsule  Commonly known as:  PRILOSEC  Take 20 mg by mouth daily at 12 noon.     ondansetron 4 MG tablet  Commonly known as:  ZOFRAN  Take 1 tablet (4 mg total) by mouth  every 6 (six) hours.     testosterone cypionate 200 MG/ML injection  Commonly known as:  DEPOTESTOTERONE CYPIONATE  Inject 200 mg into the muscle every 14 (fourteen) days.     venlafaxine XR 150 MG 24 hr capsule  Commonly known as:  EFFEXOR-XR  Take 150 mg by mouth 2 (two) times daily. 6am and noon        Disposition:   Future Appointments Provider Department Dept Phone   12/21/2012 4:30 PM [facility] [place] Office [phone]   02/03/2013 4:15 PM Quintella Reichert, MD [facility] [phone]   03/09/2013 4:15 PM Marinus Maw, MD [facility] [place] Office [phone]     Follow-up Information   Follow up with Quintella Reichert, MD On 02/03/2013. (4:15 PM)    Specialty:  Cardiology   Contact information:   [address] [place] [address] [phone]       Follow up with [facility] On 12/21/2012. (4:30 PM)    Contact information:   [address] [place] [address] [phone]      Follow up with Lewayne Bunting, MD On 03/09/2013. (4:15 PM)    Specialty:  Cardiology   Contact information:   [address] [place] [address] [phone]       Duration of Discharge Encounter: Greater than 30 minutes including physician time.  Signed,   Leonia Reeves.D.

## 2012-12-09 ENCOUNTER — Encounter (HOSPITAL_COMMUNITY): Payer: Self-pay | Admitting: *Deleted

## 2012-12-21 ENCOUNTER — Ambulatory Visit: Payer: Federal, State, Local not specified - PPO

## 2012-12-22 ENCOUNTER — Encounter: Payer: Self-pay | Admitting: Internal Medicine

## 2012-12-28 ENCOUNTER — Ambulatory Visit: Payer: Federal, State, Local not specified - PPO

## 2012-12-30 ENCOUNTER — Ambulatory Visit (INDEPENDENT_AMBULATORY_CARE_PROVIDER_SITE_OTHER): Payer: Federal, State, Local not specified - PPO | Admitting: *Deleted

## 2012-12-30 DIAGNOSIS — I255 Ischemic cardiomyopathy: Secondary | ICD-10-CM

## 2012-12-30 DIAGNOSIS — I2589 Other forms of chronic ischemic heart disease: Secondary | ICD-10-CM

## 2012-12-30 LAB — MDC_IDC_ENUM_SESS_TYPE_INCLINIC
Battery Voltage: 3.12 V
Brady Statistic RV Percent Paced: 0 %
HighPow Impedance: 78 Ohm
Lead Channel Impedance Value: 666 Ohm
Lead Channel Pacing Threshold Amplitude: 0.5 V
Lead Channel Pacing Threshold Pulse Width: 0.4 ms
Lead Channel Sensing Intrinsic Amplitude: 12.6 mV
Lead Channel Setting Pacing Amplitude: 3 V
Lead Channel Setting Pacing Pulse Width: 0.4 ms
Lead Channel Setting Sensing Sensitivity: 0.8 mV
Zone Setting Detection Interval: 270.27 ms

## 2012-12-30 NOTE — Progress Notes (Signed)
Wound check appointment. Steri-strips removed. Wound without redness or edema. Incision edges approximated, wound well healed. Normal device function. Thresholds, sensing, and impedances consistent with implant measurements. Device programmed at 3.5V for extra safety margin until 3 month visit. Histogram distribution appropriate for patient and level of activity. No ventricular arrhythmias noted. Patient educated about wound care, arm mobility, lifting restrictions, shock plan. ROV in 3 months with implanting physician.  Checked by industry. 

## 2013-01-29 ENCOUNTER — Encounter: Payer: Self-pay | Admitting: Internal Medicine

## 2013-02-03 ENCOUNTER — Encounter: Payer: Self-pay | Admitting: Cardiology

## 2013-02-03 ENCOUNTER — Ambulatory Visit (INDEPENDENT_AMBULATORY_CARE_PROVIDER_SITE_OTHER): Payer: Federal, State, Local not specified - PPO | Admitting: Cardiology

## 2013-02-03 VITALS — BP 100/70 | HR 92 | Ht 68.0 in | Wt 180.8 lb

## 2013-02-03 DIAGNOSIS — I2510 Atherosclerotic heart disease of native coronary artery without angina pectoris: Secondary | ICD-10-CM

## 2013-02-03 DIAGNOSIS — I10 Essential (primary) hypertension: Secondary | ICD-10-CM | POA: Insufficient documentation

## 2013-02-03 DIAGNOSIS — E785 Hyperlipidemia, unspecified: Secondary | ICD-10-CM

## 2013-02-03 DIAGNOSIS — I2589 Other forms of chronic ischemic heart disease: Secondary | ICD-10-CM

## 2013-02-03 DIAGNOSIS — I255 Ischemic cardiomyopathy: Secondary | ICD-10-CM | POA: Insufficient documentation

## 2013-02-03 NOTE — Progress Notes (Signed)
[place], [address] [place], [place]  [address] Phone: [phone] Fax:  [phone]  Date:  02/03/2013   ID:  Hector Byrd, DOB 1955-07-31, MRN [id number]  PCP:  Eloise Levels, NP  Cardiologist:  Fransico Him, MD     History of Present Illness: Hector Byrd is a 58 y.o. male with a history of ASCAD, dyslipidemia and HTN who presents today for followup.  He denies any chest pain, SOB, DOE, LE edema, palpitations, dizziness or syncope.  He also has a history of ischemic DCM with EF 30% and underwent AICD implantation on 12/07/2012.   Wt Readings from Last 3 Encounters:  02/03/13 180 lb 12.8 oz (82.01 kg)  12/07/12 188 lb 15 oz (85.7 kg)  12/07/12 188 lb 15 oz (85.7 kg)     Past Medical History  Diagnosis Date  . Erectile dysfunction   . CAD (coronary artery disease)     s/p stent x2 and then CABG 1996  . Rotator cuff tear   . Depression   . Dyslipidemia   . Cancer of kidney   . History of open heart surgery   . Myocardial infarction   . Hypertension   . GERD (gastroesophageal reflux disease)   . Cardiomyopathy, ischemic     EF 30% by MUGA s/p AICD    Current Outpatient Prescriptions  Medication Sig Dispense Refill  . amLODipine (NORVASC) 2.5 MG tablet Take 2.5 mg by mouth daily at 6 (six) AM. bedtime      . aspirin EC 81 MG tablet Take 81 mg by mouth daily at 12 noon.      Marland Kitchen atorvastatin (LIPITOR) 80 MG tablet Take 80 mg by mouth daily at 12 noon.       . B-D INTEGRA SYRINGE 22G X 1-1/2" 3 ML MISC       . buprenorphine-naloxone (SUBOXONE) 8-2 MG SUBL SL tablet Place 0.5-1 tablets under the tongue 4 (four) times daily. 1 tablet at noon, 1/2 tablet at 3pm and 7pm, 1 tablet at 11pm      . carvedilol (COREG) 3.125 MG tablet Take 1 tablet (3.125 mg total) by mouth 2 (two) times daily.  60 tablet  11  . clonazePAM (KLONOPIN) 1 MG tablet Take 0.5-1 mg by mouth 2 (two) times daily. 1 tablet at 6am (bedtime) and 1/2 tablet at noon      . lamoTRIgine (LAMICTAL) 100 MG  tablet Take 100 mg by mouth 2 (two) times daily. 6am and noon      . lisinopril (PRINIVIL,ZESTRIL) 10 MG tablet Take 10 mg by mouth daily at 12 noon.      . naphazoline-pheniramine (NAPHCON-A) 0.025-0.3 % ophthalmic solution Place 1 drop into both eyes 2 (two) times daily.      . nitroGLYCERIN (NITROSTAT) 0.4 MG SL tablet Place 0.4 mg under the tongue every 5 (five) minutes as needed for chest pain.      Marland Kitchen omeprazole (PRILOSEC) 20 MG capsule Take 20 mg by mouth daily at 12 noon.      . ondansetron (ZOFRAN) 4 MG tablet Take 1 tablet (4 mg total) by mouth every 6 (six) hours.  12 tablet  0  . testosterone cypionate (DEPOTESTOTERONE CYPIONATE) 200 MG/ML injection Inject 200 mg into the muscle every 14 (fourteen) days.       Marland Kitchen venlafaxine XR (EFFEXOR-XR) 150 MG 24 hr capsule Take 150 mg by mouth 2 (two) times daily. 6am and noon       No current facility-administered medications for  this visit.    Allergies:   No Known Allergies  Social History:  The patient  reports that he has been smoking Cigarettes.  He has a 60 pack-year smoking history. He has never used smokeless tobacco. He reports that he does not drink alcohol or use illicit drugs.   Family History:  The patient's family history includes Cancer in his father; Diabetes in his mother.   ROS:  Please see the history of present illness.      All other systems reviewed and negative.   PHYSICAL EXAM: VS:  BP 100/70  Pulse 92  Ht 5\' 8"  (1.727 m)  Wt 180 lb 12.8 oz (82.01 kg)  BMI 27.50 kg/m2 Well nourished, well developed, in no acute distress HEENT: normal Neck: no JVD Cardiac:  normal S1, S2; RRR; no murmur Lungs:  clear to auscultation bilaterally, no wheezing, rhonchi or rales Abd: soft, nontender, no hepatomegaly Ext: no edema Skin: warm and dry Neuro:  CNs 2-12 intact, no focal abnormalities noted       ASSESSMENT AND PLAN:  1. ASCAD with no angina  - continue ASA 2. HTN well controlled  - continue  amlodipine/carvedilol/Lisinopril  - I have asked him to check his BP daily for a week and if still running low then will stop amlodipine 3. Dyslipidemia  - continue atorvastatin  - check fasting lipids and ALT 4.  Ischemic DCM with no CHF on exam  - continue Lisinopril/Carvedilol   Followup with me in 6 months  Signed, Fransico Him, MD 02/03/2013 5:03 PM

## 2013-02-03 NOTE — Patient Instructions (Addendum)
Your physician recommends that you continue on your current medications as directed. Please refer to the Current Medication list given to you today.  Your physician recommends that you return for lab work on Friday 02/05/13 for Fasting Lipid and ALT   Your physician has requested that you regularly monitor and record your blood pressure readings at home. Please use the same machine at the same time of day to check your readings and record them for a week. Please call us at the end of the week with the results.   Your physician wants you to follow-up in: 6 Months with Dr Mallie Snooks will receive a reminder letter in the mail two months in advance. If you don't receive a letter, please call our office to schedule the follow-up appointment.

## 2013-02-05 ENCOUNTER — Other Ambulatory Visit: Payer: Federal, State, Local not specified - PPO

## 2013-03-09 ENCOUNTER — Encounter: Payer: Federal, State, Local not specified - PPO | Admitting: Internal Medicine

## 2013-03-18 ENCOUNTER — Encounter: Payer: Federal, State, Local not specified - PPO | Admitting: Internal Medicine

## 2013-04-06 ENCOUNTER — Encounter: Payer: Federal, State, Local not specified - PPO | Admitting: Cardiology

## 2013-04-30 ENCOUNTER — Other Ambulatory Visit: Payer: Self-pay

## 2013-05-04 ENCOUNTER — Encounter: Payer: Federal, State, Local not specified - PPO | Admitting: Internal Medicine

## 2013-05-05 NOTE — Telephone Encounter (Signed)
Please close encounter, Thanks! SR °

## 2013-07-31 ENCOUNTER — Emergency Department (INDEPENDENT_AMBULATORY_CARE_PROVIDER_SITE_OTHER)
Admission: EM | Admit: 2013-07-31 | Discharge: 2013-07-31 | Disposition: A | Discharge status: Home / Self Care | Payer: Federal, State, Local not specified - PPO | Source: Home / Self Care | Attending: Emergency Medicine | Admitting: Emergency Medicine

## 2013-07-31 ENCOUNTER — Encounter (HOSPITAL_COMMUNITY): Payer: Self-pay | Admitting: Emergency Medicine

## 2013-07-31 DIAGNOSIS — K047 Periapical abscess without sinus: Secondary | ICD-10-CM

## 2013-07-31 MED ORDER — CLINDAMYCIN HCL 300 MG PO CAPS: 300.0000 mg | ORAL_CAPSULE | Freq: Four times a day (QID) | ORAL | Status: DC

## 2013-07-31 MED ORDER — OXYCODONE-ACETAMINOPHEN 5-325 MG PO TABS: ORAL_TABLET | ORAL | Status: DC

## 2013-07-31 NOTE — ED Provider Notes (Signed)
Chief Complaint   Chief Complaint  Patient presents with  . Dental Pain    History of Present Illness   Hector Byrd is a 58 year old male who has had a two-day history of a toothache in his left upper dentition. The tooth is broken off. He has terrible dentition and all his teeth are decayed broken. He's had swelling of his left cheek. He's had some headache but denies fever or chills. He has no trouble swallowing or breathing. It does hurt to chew on that side. He denies any swelling or pain in the neck. No chest pain or shortness of breath.  Review of Systems   Other than as noted above, the patient denies any of the following symptoms: Systemic:  No fever or chills. ENT:  No headache, ear ache, sore throat, nasal congestion, facial pain, or swelling. Lymphatic:  No adenopathy. Lungs:  No coughing or shortness of breath.  [place]   Past medical history, family history, social history, meds, and allergies were reviewed. No medication allergies. He is on multiple medications including Lipitor, Suboxone, amlodipine, aspirin, carvedilol, clonazepam, Lamictal, lisinopril, nitroglycerin, Prilosec, Effexor, and testosterone. Current medical problems include coronary artery disease with several stent placements than CABG, depression, hyperlipidemia, kidney cancer, and gastroesophageal reflux.  Physical Examination     Vital signs:  BP 114/80  Pulse 118  Temp(Src) 99 F (37.2 C) (Oral)  Resp 20  SpO2 96% General:  Alert, oriented, in no distress. ENT:  TMs and canals normal.  Nasal mucosa normal. Mouth exam:  Dentition is terrible. All his teeth are you decayed down to the gumline or broken off. There is some swelling of the upper left gingiva, although there is no visible collection of pus. No swelling of the floor the mouth. The pharynx is clear and airway is widely patent. He has swelling externally involving the cheek. This is tender to touch in this area. No swelling of the neck or  pain to palpation. Neck:  No swelling or adenopathy. Lungs:  Breath sounds clear and equal bilaterally.  No wheezes, rales or rhonchi. Heart:  Regular rhythm.  No gallops or murmers. Skin:  Clear, warm and dry.   Assessment   The encounter diagnosis was Dental abscess.  Plan   1.  Meds:  The following meds were prescribed:   Discharge Medication List as of 07/31/2013  3:28 PM    START taking these medications   Details  clindamycin (CLEOCIN) 300 MG capsule Take 1 capsule (300 mg total) by mouth 4 (four) times daily., Starting 07/31/2013, Until Discontinued, Normal    oxyCODONE-acetaminophen (PERCOCET) 5-325 MG per tablet Take 1 tablet every 6 hours as needed for pain, Print        2.  Patient Education/Counseling:  The patient was given appropriate handouts, self care instructions, and instructed in symptomatic relief. Suggested sleeping with head of bed elevated and hot salt water mouthwash. The patient is on Suboxone for alcohol and opioid abuse. He knows to have his daughter administer the pain medicine and to stop the Suboxone while he is on pain medicine. I told him I would prescribe a limited number of the Tylox, and no more after that.  3.  Follow up:  The patient was told to follow up if no better in 3 to 4 days, if becoming worse in any way, and given some red flag symptoms such as difficulty swallowing or breathing which would prompt immediate return.  Follow up with a dentist as soon as posssible.  Harden Mo, MD 07/31/13 [phone]

## 2013-07-31 NOTE — ED Notes (Signed)
C/o abscess of left upper tooth.  Pain and swelling.  On set yesterday.  Redness of left side of face.  Pt is taking tylenol for pain.

## 2013-07-31 NOTE — Discharge Instructions (Signed)
Look up the [place] [facility] for free dental clinics. undoomedical.com.asp  Get there early and be prepared to wait. [facility] and [employer] have Copywriter, advertising schools that provide low cost routine dental care.   Other resources: [facility] [place], [place] [phone]  Patients with Medicaid: [facility] [address] [facility] [employer] Phone:  [phone]                                                  Phone:  [phone]  Dr. Ardyth Harps [address]. [phone]  If unable to pay or uninsured, contact:  [place] or [facility]. to become qualified for the adult dental clinic.  No matter what dental problem you have, it will not get better unless you get good dental care.  If the tooth is not taken care of, your symptoms will come back in time and you will be visiting [place] again in the [place]gent [place] with a bad toothache.  So, see your dentist as soon as possible.  If you don't have a dentist, we can give you a list of dentists.  Sometimes the most cost effective treatment is removal of the tooth.  This can be done very inexpensively through one of the low cost Dance movement psychotherapist such as the facility on [facility] in [place] [phone]).  The downside to this is that you will have one less tooth and this can effect your ability to chew.  Some other things that can be done for a dental infection include the following:   Rinse your mouth out with hot salt water (1/2 tsp of table salt and a pinch of baking soda in 8 oz of hot water).  You can do this every 2 or 3 hours.  Avoid cold foods, beverages, and cold air.  This will make your symptoms worse.  Sleep with your head elevated.  Sleeping flat will cause your gums and oral tissues to swell and make them hurt  more.  You can sleep on several pillows.  Even better is to sleep in a recliner with your head higher than your heart.  For mild to moderate pain, you can take Tylenol, ibuprofen, or Aleve.  External application of heat by a heating pad, hot water bottle, or hot wet towel can help with pain and speed healing.  You can do this every 2 to 3 hours. Do not fall asleep on a heating pad since this can cause a burn.   Go to www.goodrx.com to look up your medications. This will give you a list of where you can find your prescriptions at the most affordable prices.   RESOURCE GUIDE  Dental Problems  Look up SuperiorMarketers.be.asp for a schedule of the [facility] [facility]'s free dental clinics called [place]. They have clinics all around [place]. Get there early and be prepared to wait.   [facility] [address]  [place], [place]  [address] °[phone] ° °[facility] °[address] °[place], [place] °[phone] ° °Patients with Medicaid: °[facility]                     [facility] °[address]                                [address] °Phone:  [phone]                                                  Phone:  [phone] ° °If unable to pay or uninsured, contact:  [facility] or [facility] to become qualified for the adult dental clinic. ° °

## 2013-08-20 ENCOUNTER — Other Ambulatory Visit: Payer: Self-pay

## 2013-08-20 MED ORDER — ATORVASTATIN CALCIUM 80 MG PO TABS: 80.0000 mg | ORAL_TABLET | Freq: Every day | ORAL | Status: DC

## 2013-10-14 ENCOUNTER — Other Ambulatory Visit: Payer: Self-pay | Admitting: Cardiology

## 2013-10-19 ENCOUNTER — Ambulatory Visit (INDEPENDENT_AMBULATORY_CARE_PROVIDER_SITE_OTHER): Payer: Federal, State, Local not specified - PPO | Admitting: Cardiology

## 2013-10-19 ENCOUNTER — Encounter: Payer: Self-pay | Admitting: Cardiology

## 2013-10-19 VITALS — BP 118/68 | HR 76 | Ht 68.0 in | Wt 186.0 lb

## 2013-10-19 DIAGNOSIS — R0602 Shortness of breath: Secondary | ICD-10-CM | POA: Insufficient documentation

## 2013-10-19 DIAGNOSIS — I2510 Atherosclerotic heart disease of native coronary artery without angina pectoris: Secondary | ICD-10-CM

## 2013-10-19 DIAGNOSIS — E785 Hyperlipidemia, unspecified: Secondary | ICD-10-CM

## 2013-10-19 DIAGNOSIS — I255 Ischemic cardiomyopathy: Secondary | ICD-10-CM

## 2013-10-19 DIAGNOSIS — I10 Essential (primary) hypertension: Secondary | ICD-10-CM

## 2013-10-19 NOTE — Progress Notes (Signed)
[place], [place], [place]  [address] Phone: [phone] Fax:  [phone]  Date:  10/19/2013   ID:  Hector Byrd, DOB 1955-05-18, MRN [id number]  PCP:  Eloise Levels, NP  Cardiologist:  Fransico Him, MD    History of Present Illness: Hector Byrd is a 58 y.o. male with a history of ASCAD, dyslipidemia and HTN who presents today for followup.He also has a history of ischemic DCM with EF 30% and underwent AICD implantation on 12/07/2012.   He denies any chest pain,  LE edema, palpitations or syncope.  He denies any PND or orthopnea. He has chronic DOE which is unchanged since I saw him last.  He occasionally has some dizziness that can come on at any time from sitting to standing or standing for too long at a time and he loses his balance.     Wt Readings from Last 3 Encounters:  10/19/13 186 lb (84.369 kg)  02/03/13 180 lb 12.8 oz (82.01 kg)  12/07/12 188 lb 15 oz (85.7 kg)     Past Medical History  Diagnosis Date  . Erectile dysfunction   . CAD (coronary artery disease)     s/p stent x2 and then CABG 1996  . Rotator cuff tear   . Depression   . Dyslipidemia   . Cancer of kidney   . History of open heart surgery   . Myocardial infarction   . Hypertension   . GERD (gastroesophageal reflux disease)   . Cardiomyopathy, ischemic     EF 30% by MUGA s/p AICD    Current Outpatient Prescriptions  Medication Sig Dispense Refill  . amLODipine (NORVASC) 2.5 MG tablet take 1 tablet by mouth once daily  10 tablet  0  . aspirin EC 81 MG tablet Take 81 mg by mouth daily at 12 noon.      Marland Kitchen atorvastatin (LIPITOR) 80 MG tablet Take 1 tablet (80 mg total) by mouth daily at 12 noon.  30 tablet  6  . B-D INTEGRA SYRINGE 22G X 1-1/2" 3 ML MISC       . buprenorphine-naloxone (SUBOXONE) 8-2 MG SUBL SL tablet Place 0.5-1 tablets under the tongue 4 (four) times daily. 1 tablet at noon, 1/2 tablet at 3pm and 7pm, 1 tablet at 11pm      . carvedilol (COREG) 3.125 MG tablet Take 1  tablet (3.125 mg total) by mouth 2 (two) times daily.  60 tablet  11  . clindamycin (CLEOCIN) 300 MG capsule Take 1 capsule (300 mg total) by mouth 4 (four) times daily.  40 capsule  0  . clonazePAM (KLONOPIN) 1 MG tablet Take 0.5-1 mg by mouth 2 (two) times daily. 1 tablet at 6am (bedtime) and 1/2 tablet at noon      . lamoTRIgine (LAMICTAL) 100 MG tablet Take 100 mg by mouth 2 (two) times daily. 6am and noon      . lisinopril (PRINIVIL,ZESTRIL) 10 MG tablet Take 10 mg by mouth daily at 12 noon.      . naphazoline-pheniramine (NAPHCON-A) 0.025-0.3 % ophthalmic solution Place 1 drop into both eyes 2 (two) times daily.      . nitroGLYCERIN (NITROSTAT) 0.4 MG SL tablet Place 0.4 mg under the tongue every 5 (five) minutes as needed for chest pain.      Marland Kitchen omeprazole (PRILOSEC) 20 MG capsule Take 20 mg by mouth daily at 12 noon.      . ondansetron (ZOFRAN) 4 MG tablet Take 1 tablet (4 mg  total) by mouth every 6 (six) hours.  12 tablet  0  . oxyCODONE-acetaminophen (PERCOCET) 5-325 MG per tablet Take 1 tablet every 6 hours as needed for pain  12 tablet  0  . testosterone cypionate (DEPOTESTOTERONE CYPIONATE) 200 MG/ML injection Inject 200 mg into the muscle every 14 (fourteen) days.       Marland Kitchen venlafaxine XR (EFFEXOR-XR) 150 MG 24 hr capsule Take 150 mg by mouth 2 (two) times daily. 6am and noon       No current facility-administered medications for this visit.    Allergies:   No Known Allergies  Social History:  The patient  reports that he has been smoking Cigarettes.  He has a 60 pack-year smoking history. He has never used smokeless tobacco. He reports that he does not drink alcohol or use illicit drugs.   Family History:  The patient's family history includes Cancer in his father; Diabetes in his mother.   ROS:  Please see the history of present illness.      All other systems reviewed and negative.   PHYSICAL EXAM: VS:  BP 118/68  Pulse 76  Ht 5\' 8"  (1.727 m)  Wt 186 lb (84.369 kg)  BMI  28.29 kg/m2 Well nourished, well developed, in no acute distress HEENT: normal Neck: no JVD Cardiac:  normal S1, S2; RRR; no murmur Lungs:  clear to auscultation bilaterally, no wheezing, rhonchi or rales Abd: soft, nontender, no hepatomegaly Ext: no edema Skin: warm and dry Neuro:  CNs 2-12 intact, no focal abnormalities noted  ASSESSMENT AND PLAN:  1. ASCAD with no angina - continue ASA  2. HTN well controlled - continue amlodipine/carvedilol/Lisinopril  3. Dyslipidemia - continue atorvastatin  - check fasting lipids and ALT       4. Ischemic DCM with no CHF on exam  - continue Lisinopril/Carvedilol  - check BMET      5.  Chronic SOB - check BNP  Followup with me in 6 months       Signed, Fransico Him, MD [facility] 10/19/2013 3:38 PM

## 2013-10-19 NOTE — Patient Instructions (Addendum)
Your physician recommends that you continue on your current medications as directed. Please refer to the Current Medication list given to you today.  Lab Today: [facility], [place]  Your physician recommends that you return for a FASTING lipid profile and alt on 10/21/13 between 7:30am-5:15pm  Your physician wants you to follow-up in: 6 months You will receive a reminder letter in the mail two months in advance. If you don't receive a letter, please call our office to schedule the follow-up appointment.

## 2013-10-20 LAB — BASIC METABOLIC PANEL
BUN: 11 mg/dL (ref 6–23)
CO2: 28 mEq/L (ref 19–32)
CREATININE: 1 mg/dL (ref 0.4–1.5)
Calcium: 9.1 mg/dL (ref 8.4–10.5)
Chloride: 103 mEq/L (ref 96–112)
GFR: 79.9 mL/min (ref >60.00)
Glucose, Bld: 101 mg/dL — ABNORMAL HIGH (ref 70–99)
Potassium: 4.5 mEq/L (ref 3.5–5.1)
Sodium: 136 mEq/L (ref 135–145)

## 2013-10-20 LAB — BRAIN NATRIURETIC PEPTIDE: Pro B Natriuretic peptide (BNP): 25 pg/mL (ref 0.0–100.0)

## 2013-10-21 ENCOUNTER — Other Ambulatory Visit (INDEPENDENT_AMBULATORY_CARE_PROVIDER_SITE_OTHER): Payer: Federal, State, Local not specified - PPO | Admitting: *Deleted

## 2013-10-21 DIAGNOSIS — E785 Hyperlipidemia, unspecified: Secondary | ICD-10-CM

## 2013-10-21 LAB — LIPID PANEL
CHOLESTEROL: 108 mg/dL (ref 0–200)
HDL: 20.1 mg/dL — ABNORMAL LOW (ref >39.00)
LDL CALC: 52 mg/dL (ref 0–99)
NonHDL: 87.9
TRIGLYCERIDES: 179 mg/dL — AB (ref 0.0–149.0)
Total CHOL/HDL Ratio: 5
VLDL: 35.8 mg/dL (ref 0.0–40.0)

## 2013-10-21 LAB — ALT: ALT: 27 U/L (ref 0–53)

## 2013-10-29 ENCOUNTER — Other Ambulatory Visit: Payer: Self-pay

## 2013-10-29 MED ORDER — LISINOPRIL 10 MG PO TABS: 10.0000 mg | ORAL_TABLET | Freq: Every day | ORAL | Status: DC

## 2013-11-05 ENCOUNTER — Other Ambulatory Visit: Payer: Self-pay | Admitting: *Deleted

## 2013-11-05 MED ORDER — AMLODIPINE BESYLATE 2.5 MG PO TABS: ORAL_TABLET | ORAL | Status: DC

## 2013-11-16 ENCOUNTER — Encounter: Payer: Self-pay | Admitting: *Deleted

## 2013-11-29 ENCOUNTER — Other Ambulatory Visit: Payer: Self-pay | Admitting: *Deleted

## 2013-11-29 MED ORDER — CARVEDILOL 3.125 MG PO TABS: 3.1250 mg | ORAL_TABLET | Freq: Two times a day (BID) | ORAL | Status: DC

## 2013-12-23 ENCOUNTER — Encounter (HOSPITAL_COMMUNITY): Payer: Self-pay | Admitting: Internal Medicine

## 2014-02-24 ENCOUNTER — Other Ambulatory Visit: Payer: Self-pay

## 2014-02-24 MED ORDER — LISINOPRIL 10 MG PO TABS: 10.0000 mg | ORAL_TABLET | Freq: Every day | ORAL | Status: DC

## 2014-03-12 ENCOUNTER — Other Ambulatory Visit: Payer: Self-pay | Admitting: Cardiology

## 2014-04-20 ENCOUNTER — Encounter: Payer: Self-pay | Admitting: Internal Medicine

## 2014-04-20 ENCOUNTER — Ambulatory Visit (INDEPENDENT_AMBULATORY_CARE_PROVIDER_SITE_OTHER): Payer: Federal, State, Local not specified - PPO | Admitting: Internal Medicine

## 2014-04-20 VITALS — BP 132/68 | HR 105 | Ht 68.0 in | Wt 189.0 lb

## 2014-04-20 DIAGNOSIS — I420 Dilated cardiomyopathy: Principal | ICD-10-CM

## 2014-04-20 DIAGNOSIS — Z4502 Encounter for adjustment and management of automatic implantable cardiac defibrillator: Secondary | ICD-10-CM

## 2014-04-20 DIAGNOSIS — I10 Essential (primary) hypertension: Secondary | ICD-10-CM | POA: Diagnosis not present

## 2014-04-20 DIAGNOSIS — Z95810 Presence of automatic (implantable) cardiac defibrillator: Secondary | ICD-10-CM

## 2014-04-20 DIAGNOSIS — I255 Ischemic cardiomyopathy: Secondary | ICD-10-CM

## 2014-04-20 LAB — MDC_IDC_ENUM_SESS_TYPE_INCLINIC
HighPow Impedance: 89 Ohm
Implantable Pulse Generator Serial Number: 60740583
Lead Channel Pacing Threshold Amplitude: 0.4 V
Lead Channel Pacing Threshold Pulse Width: 0.4 ms
Lead Channel Setting Pacing Amplitude: 3 V
Lead Channel Setting Pacing Pulse Width: 0.4 ms
Lead Channel Setting Sensing Sensitivity: 0.8 mV
MDC IDC MSMT LEADCHNL RA SENSING INTR AMPL: 8 mV
MDC IDC MSMT LEADCHNL RV IMPEDANCE VALUE: 738 Ohm
MDC IDC MSMT LEADCHNL RV SENSING INTR AMPL: 20 mV
MDC IDC SET ZONE DETECTION INTERVAL: 270.27 ms
MDC IDC SET ZONE DETECTION INTERVAL: 319.15 ms
MDC IDC STAT BRADY RA PERCENT PACED: 0 %
MDC IDC STAT BRADY RV PERCENT PACED: 0 %

## 2014-04-20 NOTE — Assessment & Plan Note (Signed)
His blood pressure is well controlled. He will continue his low salt diet and his current medical therapy.

## 2014-04-20 NOTE — Patient Instructions (Signed)
Your physician recommends that you continue on your current medications as directed. Please refer to the Current Medication list given to you today.  Remote monitoring is used to monitor your Pacemaker of ICD from home. This monitoring reduces the number of office visits required to check your device to one time per year. It allows us to keep an eye on the functioning of your device to ensure it is working properly. You are scheduled for a device check from home on 07/20/14. You may send your transmission at any time that day. If you have a wireless device, the transmission will be sent automatically. After your physician reviews your transmission, you will receive a postcard with your next transmission date.  Your physician wants you to follow-up in: 1 year with Dr. Taylor. You will receive a reminder letter in the mail two months in advance. If you don't receive a letter, please call our office to schedule the follow-up appointment.  

## 2014-04-20 NOTE — Progress Notes (Signed)
HPI [name] returns today for additional ICD follow-up. He is a very pleasant 59 man with a history of coronary artery disease status post myocardial infarction, status post bypass surgery in 1996. In the interim, he has had multiple percutaneous interventions. He has class II heart failure symptoms. He is able walk at a modest most days but has trouble going up hills or inclines quickly. The patient has been on maximal medical therapy. He has had long-standing left ventricular dysfunction with an ejection fraction of 35% or less for the last 10 years. He was recently started on low-dose carvedilol his primary cardiologist. He remains on lisinopril.  No Known Allergies   Current Outpatient Prescriptions  Medication Sig Dispense Refill  . amLODipine (NORVASC) 2.5 MG tablet take 1 tablet by mouth once daily 30 tablet 6  . aspirin EC 81 MG tablet Take 81 mg by mouth daily at 12 noon.    Marland Kitchen atorvastatin (LIPITOR) 80 MG tablet TAKE 1 TABLET BY MOUTH EVERY DAY AT NOON 30 tablet 6  . B-D INTEGRA SYRINGE 22G X 1-1/2" 3 ML MISC     . buprenorphine-naloxone (SUBOXONE) 8-2 MG SUBL SL tablet Place 0.5-1 tablets under the tongue 4 (four) times daily. 1 tablet at noon, 1/2 tablet at 3pm and 7pm, 1 tablet at 11pm    . carvedilol (COREG) 3.125 MG tablet Take 1 tablet (3.125 mg total) by mouth 2 (two) times daily. 60 tablet 5  . clindamycin (CLEOCIN) 300 MG capsule Take 1 capsule (300 mg total) by mouth 4 (four) times daily. 40 capsule 0  . clonazePAM (KLONOPIN) 1 MG tablet Take 0.5-1 mg by mouth 2 (two) times daily. 1 tablet at 6am (bedtime) and 1/2 tablet at noon    . lamoTRIgine (LAMICTAL) 100 MG tablet Take 100 mg by mouth 2 (two) times daily. 6am and noon    . lisinopril (PRINIVIL,ZESTRIL) 10 MG tablet Take 1 tablet (10 mg total) by mouth daily at 12 noon. 30 tablet 10  . naphazoline-pheniramine (NAPHCON-A) 0.025-0.3 % ophthalmic solution Place 1 drop into both eyes 2 (two) times daily.    .  nitroGLYCERIN (NITROSTAT) 0.4 MG SL tablet Place 0.4 mg under the tongue every 5 (five) minutes as needed for chest pain.    Marland Kitchen omeprazole (PRILOSEC) 20 MG capsule Take 20 mg by mouth daily at 12 noon.    . ondansetron (ZOFRAN) 4 MG tablet Take 1 tablet (4 mg total) by mouth every 6 (six) hours. 12 tablet 0  . oxyCODONE-acetaminophen (PERCOCET) 5-325 MG per tablet Take 1 tablet every 6 hours as needed for pain 12 tablet 0  . testosterone cypionate (DEPOTESTOTERONE CYPIONATE) 200 MG/ML injection Inject 200 mg into the muscle every 14 (fourteen) days.     Marland Kitchen venlafaxine XR (EFFEXOR-XR) 150 MG 24 hr capsule Take 150 mg by mouth 2 (two) times daily. 6am and noon     No current facility-administered medications for this visit.     Past Medical History  Diagnosis Date  . Erectile dysfunction   . CAD (coronary artery disease)     s/p stent x2 and then CABG 1996  . Rotator cuff tear   . Depression   . Dyslipidemia   . Cancer of kidney   . History of open heart surgery   . Myocardial infarction   . Hypertension   . GERD (gastroesophageal reflux disease)   . Cardiomyopathy, ischemic     EF 30% by MUGA s/p AICD  ROS:   All systems reviewed and negative except as noted in the HPI.   Past Surgical History  Procedure Laterality Date  . Rotator cuff repair    . Coronary stent placement    . Implantable cardioverter defibrillator implant  12-07-2012    BTK single chamber ICD implanted by Dr Lovena Le  . Implantable cardioverter defibrillator implant N/A 12/07/2012    Procedure: IMPLANTABLE CARDIOVERTER DEFIBRILLATOR IMPLANT;  Surgeon: Evans Lance, MD;  Location: [facility] CATH LAB;  Service: Cardiovascular;  Laterality: N/A;     Family History  Problem Relation Age of Onset  . Diabetes Mother   . Cancer Father      History   Social History  . Marital Status: Single    Spouse Name: N/A  . Number of Children: N/A  . Years of Education: N/A   Occupational History  . Not on file.    Social History Main Topics  . Smoking status: Current Every Day Smoker -- 1.50 packs/day for 40 years    Types: Cigarettes  . Smokeless tobacco: Never Used     Comment: TRYING TO QUIT   . Alcohol Use: No     Comment: HISTORY OF DRUG & ETOH   . Drug Use: No  . Sexual Activity: Not on file   Other Topics Concern  . Not on file   Social History Narrative     BP 132/68 mmHg  Pulse 105  Ht 5\' 8"  (1.727 m)  Wt 189 lb (85.73 kg)  BMI 28.74 kg/m2  Physical Exam:  Well appearing 59 year old man, NAD HEENT: Unremarkable Neck:  6 cm JVD, no thyromegally Back:  No CVA tenderness Lungs:  Clear with no wheezes, rales, or rhonchi. HEART:  Regular rate rhythm, no murmurs, no rubs, no clicks Abd:  soft, positive bowel sounds, no organomegally, no rebound, no guarding Ext:  2 plus pulses, no edema, no cyanosis, no clubbing Skin:  No rashes no nodules Neuro:  CN II through XII intact, motor grossly intact  EKG - normal sinus with prior inferior and anterior myocardial infarctions  DEVICE  Normal device function.  See PaceArt for details.   Assess/Plan:

## 2014-04-20 NOTE — Assessment & Plan Note (Signed)
He denies anginal symptoms. He will continue his current medical therapy. I've encouraged the patient to increase his physical activity.

## 2014-04-20 NOTE — Assessment & Plan Note (Signed)
His Biotronik VDD ICD is working normally. We'll plan to recheck in several months.

## 2014-04-26 ENCOUNTER — Encounter: Payer: Self-pay | Admitting: Internal Medicine

## 2014-06-17 ENCOUNTER — Other Ambulatory Visit: Payer: Self-pay | Admitting: *Deleted

## 2014-06-17 MED ORDER — AMLODIPINE BESYLATE 2.5 MG PO TABS: ORAL_TABLET | ORAL | Status: DC

## 2014-07-20 ENCOUNTER — Ambulatory Visit (INDEPENDENT_AMBULATORY_CARE_PROVIDER_SITE_OTHER): Payer: Federal, State, Local not specified - PPO | Admitting: *Deleted

## 2014-07-20 DIAGNOSIS — I255 Ischemic cardiomyopathy: Secondary | ICD-10-CM | POA: Diagnosis not present

## 2014-07-20 DIAGNOSIS — I420 Dilated cardiomyopathy: Principal | ICD-10-CM

## 2014-07-20 NOTE — Progress Notes (Signed)
Remote ICD transmission.   

## 2014-07-25 LAB — CUP PACEART REMOTE DEVICE CHECK
Brady Statistic RV Percent Paced: 0 %
Date Time Interrogation Session: 20160711092642
HighPow Impedance: 83 Ohm
Lead Channel Pacing Threshold Amplitude: 0.4 V
Lead Channel Sensing Intrinsic Amplitude: 20 mV
Lead Channel Sensing Intrinsic Amplitude: 8 mV
Lead Channel Setting Pacing Pulse Width: 0.4 ms
MDC IDC MSMT BATTERY VOLTAGE: 3.12 V
MDC IDC MSMT LEADCHNL RV IMPEDANCE VALUE: 680 Ohm
MDC IDC MSMT LEADCHNL RV PACING THRESHOLD PULSEWIDTH: 0.4 ms
MDC IDC SET LEADCHNL RV PACING AMPLITUDE: 1.4 V
MDC IDC SET LEADCHNL RV SENSING SENSITIVITY: 0.8 mV
MDC IDC SET ZONE DETECTION INTERVAL: 319.15 ms
Pulse Gen Serial Number: 60740583
Zone Setting Detection Interval: 270.27 ms

## 2014-08-10 ENCOUNTER — Encounter: Payer: Self-pay | Admitting: *Deleted

## 2014-08-12 ENCOUNTER — Encounter: Payer: Self-pay | Admitting: Internal Medicine

## 2014-10-24 ENCOUNTER — Ambulatory Visit (INDEPENDENT_AMBULATORY_CARE_PROVIDER_SITE_OTHER): Payer: Federal, State, Local not specified - PPO | Admitting: *Deleted

## 2014-10-24 DIAGNOSIS — I255 Ischemic cardiomyopathy: Secondary | ICD-10-CM

## 2014-10-24 DIAGNOSIS — I420 Dilated cardiomyopathy: Principal | ICD-10-CM

## 2014-10-24 NOTE — Progress Notes (Signed)
Remote ICD transmission.   

## 2014-10-25 LAB — CUP PACEART REMOTE DEVICE CHECK
HIGH POWER IMPEDANCE MEASURED VALUE: 87 Ohm
Lead Channel Impedance Value: 651 Ohm
Lead Channel Pacing Threshold Pulse Width: 0.4 ms
Lead Channel Sensing Intrinsic Amplitude: 8 mV
MDC IDC MSMT LEADCHNL RV PACING THRESHOLD AMPLITUDE: 0.5 V
MDC IDC MSMT LEADCHNL RV SENSING INTR AMPL: 19.9 mV
MDC IDC SESS DTM: 20161011102322
MDC IDC STAT BRADY RV PERCENT PACED: 0 %
Pulse Gen Model: 383594
Pulse Gen Serial Number: 60740583

## 2014-11-01 ENCOUNTER — Encounter: Payer: Self-pay | Admitting: Cardiology

## 2014-11-11 ENCOUNTER — Encounter: Payer: Self-pay | Admitting: Cardiology

## 2014-11-16 ENCOUNTER — Encounter: Payer: Self-pay | Admitting: Internal Medicine

## 2014-11-16 ENCOUNTER — Encounter: Payer: Self-pay | Admitting: Cardiology

## 2014-11-25 ENCOUNTER — Encounter: Payer: Self-pay | Admitting: Cardiology

## 2015-01-23 ENCOUNTER — Other Ambulatory Visit: Payer: Self-pay

## 2015-01-23 ENCOUNTER — Ambulatory Visit (INDEPENDENT_AMBULATORY_CARE_PROVIDER_SITE_OTHER): Payer: Federal, State, Local not specified - PPO | Admitting: *Deleted

## 2015-01-23 DIAGNOSIS — I255 Ischemic cardiomyopathy: Secondary | ICD-10-CM

## 2015-01-23 DIAGNOSIS — I420 Dilated cardiomyopathy: Principal | ICD-10-CM

## 2015-01-23 MED ORDER — AMLODIPINE BESYLATE 2.5 MG PO TABS: ORAL_TABLET | ORAL | Status: DC

## 2015-01-23 NOTE — Progress Notes (Signed)
Remote ICD transmission.   

## 2015-02-14 LAB — CUP PACEART REMOTE DEVICE CHECK
HIGH POWER IMPEDANCE MEASURED VALUE: 84 Ohm
Lead Channel Impedance Value: 637 Ohm
Lead Channel Pacing Threshold Amplitude: 0.5 V
Lead Channel Pacing Threshold Pulse Width: 0.4 ms
Lead Channel Sensing Intrinsic Amplitude: 20 mV
Lead Channel Sensing Intrinsic Amplitude: 8 mV
MDC IDC LEAD IMPLANT DT: 20141124
MDC IDC LEAD LOCATION: 753860
MDC IDC LEAD MODEL: 365501
MDC IDC LEAD SERIAL: 10552343
MDC IDC MSMT BATTERY VOLTAGE: 3.12 V
MDC IDC SESS DTM: 20170131104844
MDC IDC SET LEADCHNL RV PACING AMPLITUDE: 1.5 V
MDC IDC SET LEADCHNL RV PACING PULSEWIDTH: 0.4 ms
MDC IDC SET LEADCHNL RV SENSING SENSITIVITY: 0.8 mV
MDC IDC STAT BRADY RV PERCENT PACED: 0 %
Pulse Gen Model: 383594
Pulse Gen Serial Number: 60740583

## 2015-02-15 ENCOUNTER — Encounter: Payer: Self-pay | Admitting: Cardiology

## 2015-03-01 ENCOUNTER — Encounter: Payer: Self-pay | Admitting: Cardiology

## 2015-03-18 ENCOUNTER — Other Ambulatory Visit: Payer: Self-pay | Admitting: Cardiology

## 2015-12-15 ENCOUNTER — Other Ambulatory Visit: Payer: Self-pay | Admitting: Cardiology

## 2016-02-09 DIAGNOSIS — F410 Panic disorder [episodic paroxysmal anxiety] without agoraphobia: Secondary | ICD-10-CM | POA: Diagnosis not present

## 2016-02-09 DIAGNOSIS — F3342 Major depressive disorder, recurrent, in full remission: Secondary | ICD-10-CM | POA: Diagnosis not present

## 2016-04-05 ENCOUNTER — Ambulatory Visit (INDEPENDENT_AMBULATORY_CARE_PROVIDER_SITE_OTHER): Payer: Federal, State, Local not specified - PPO | Admitting: *Deleted

## 2016-04-05 DIAGNOSIS — I255 Ischemic cardiomyopathy: Secondary | ICD-10-CM

## 2016-04-05 DIAGNOSIS — I420 Dilated cardiomyopathy: Secondary | ICD-10-CM | POA: Diagnosis not present

## 2016-04-05 LAB — CUP PACEART REMOTE DEVICE CHECK
Battery Remaining Percentage: 82 %
Brady Statistic AS VS Percent: 100 %
Brady Statistic RV Percent Paced: 0 %
HighPow Impedance: 78 Ohm
Implantable Lead Implant Date: 20141124
Implantable Lead Location: 753860
Implantable Lead Serial Number: 10552343
Implantable Pulse Generator Implant Date: 20141124
Lead Channel Impedance Value: 622 Ohm
Lead Channel Pacing Threshold Amplitude: 0.5 V
Lead Channel Setting Sensing Sensitivity: 0.8 mV
MDC IDC LEAD MODEL: 365501
MDC IDC MSMT BATTERY VOLTAGE: 3.12 V
MDC IDC MSMT LEADCHNL RV PACING THRESHOLD PULSEWIDTH: 0.4 ms
MDC IDC SESS DTM: 20180402094950
MDC IDC SET LEADCHNL RV PACING AMPLITUDE: 1.5 V
MDC IDC SET LEADCHNL RV PACING PULSEWIDTH: 0.4 ms
MDC IDC STAT BRADY AS VP PERCENT: 0 %
Pulse Gen Serial Number: 60740583

## 2016-04-09 NOTE — Progress Notes (Signed)
Remote ICD transmission.   

## 2016-04-11 ENCOUNTER — Encounter: Payer: Self-pay | Admitting: Cardiology

## 2016-04-23 ENCOUNTER — Emergency Department (HOSPITAL_COMMUNITY): Payer: Federal, State, Local not specified - PPO

## 2016-04-23 ENCOUNTER — Encounter (HOSPITAL_COMMUNITY): Payer: Self-pay

## 2016-04-23 ENCOUNTER — Emergency Department (HOSPITAL_COMMUNITY)
Admission: EM | Admit: 2016-04-23 | Discharge: 2016-04-24 | Disposition: A | Discharge status: Home / Self Care | Payer: Federal, State, Local not specified - PPO | Attending: Emergency Medicine | Admitting: Emergency Medicine

## 2016-04-23 DIAGNOSIS — I10 Essential (primary) hypertension: Secondary | ICD-10-CM | POA: Diagnosis not present

## 2016-04-23 DIAGNOSIS — W1830XA Fall on same level, unspecified, initial encounter: Secondary | ICD-10-CM | POA: Insufficient documentation

## 2016-04-23 DIAGNOSIS — I2510 Atherosclerotic heart disease of native coronary artery without angina pectoris: Secondary | ICD-10-CM | POA: Insufficient documentation

## 2016-04-23 DIAGNOSIS — S4992XA Unspecified injury of left shoulder and upper arm, initial encounter: Secondary | ICD-10-CM | POA: Diagnosis present

## 2016-04-23 DIAGNOSIS — Y939 Activity, unspecified: Secondary | ICD-10-CM | POA: Insufficient documentation

## 2016-04-23 DIAGNOSIS — E860 Dehydration: Secondary | ICD-10-CM | POA: Insufficient documentation

## 2016-04-23 DIAGNOSIS — Z85528 Personal history of other malignant neoplasm of kidney: Secondary | ICD-10-CM | POA: Insufficient documentation

## 2016-04-23 DIAGNOSIS — Z79899 Other long term (current) drug therapy: Secondary | ICD-10-CM | POA: Diagnosis not present

## 2016-04-23 DIAGNOSIS — R55 Syncope and collapse: Secondary | ICD-10-CM | POA: Diagnosis not present

## 2016-04-23 DIAGNOSIS — S42292A Other displaced fracture of upper end of left humerus, initial encounter for closed fracture: Secondary | ICD-10-CM | POA: Insufficient documentation

## 2016-04-23 DIAGNOSIS — Z7982 Long term (current) use of aspirin: Secondary | ICD-10-CM | POA: Insufficient documentation

## 2016-04-23 DIAGNOSIS — S42242A 4-part fracture of surgical neck of left humerus, initial encounter for closed fracture: Secondary | ICD-10-CM | POA: Diagnosis not present

## 2016-04-23 DIAGNOSIS — M25522 Pain in left elbow: Secondary | ICD-10-CM | POA: Diagnosis not present

## 2016-04-23 DIAGNOSIS — F17210 Nicotine dependence, cigarettes, uncomplicated: Secondary | ICD-10-CM | POA: Diagnosis not present

## 2016-04-23 DIAGNOSIS — Y929 Unspecified place or not applicable: Secondary | ICD-10-CM | POA: Diagnosis not present

## 2016-04-23 DIAGNOSIS — I252 Old myocardial infarction: Secondary | ICD-10-CM | POA: Insufficient documentation

## 2016-04-23 DIAGNOSIS — R296 Repeated falls: Secondary | ICD-10-CM | POA: Diagnosis not present

## 2016-04-23 DIAGNOSIS — W19XXXA Unspecified fall, initial encounter: Secondary | ICD-10-CM

## 2016-04-23 DIAGNOSIS — S59902A Unspecified injury of left elbow, initial encounter: Secondary | ICD-10-CM | POA: Diagnosis not present

## 2016-04-23 DIAGNOSIS — Y999 Unspecified external cause status: Secondary | ICD-10-CM | POA: Insufficient documentation

## 2016-04-23 DIAGNOSIS — R0602 Shortness of breath: Secondary | ICD-10-CM | POA: Diagnosis not present

## 2016-04-23 DIAGNOSIS — S42212A Unspecified displaced fracture of surgical neck of left humerus, initial encounter for closed fracture: Secondary | ICD-10-CM | POA: Diagnosis not present

## 2016-04-23 LAB — CBC
HCT: 44.2 % (ref 39.0–52.0)
Hemoglobin: 15.3 g/dL (ref 13.0–17.0)
MCH: 31.9 pg (ref 26.0–34.0)
MCHC: 34.6 g/dL (ref 30.0–36.0)
MCV: 92.3 fL (ref 78.0–100.0)
Platelets: 269 10*3/uL (ref 150–400)
RBC: 4.79 MIL/uL (ref 4.22–5.81)
RDW: 14.2 % (ref 11.5–15.5)
WBC: 16.2 10*3/uL — ABNORMAL HIGH (ref 4.0–10.5)

## 2016-04-23 LAB — URINALYSIS, ROUTINE W REFLEX MICROSCOPIC
BILIRUBIN URINE: NEGATIVE
Bacteria, UA: NONE SEEN
GLUCOSE, UA: NEGATIVE mg/dL
HGB URINE DIPSTICK: NEGATIVE
KETONES UR: NEGATIVE mg/dL
Nitrite: NEGATIVE
Protein, ur: NEGATIVE mg/dL
Specific Gravity, Urine: 1.027 (ref 1.005–1.030)
Squamous Epithelial / LPF: NONE SEEN
pH: 6 (ref 5.0–8.0)

## 2016-04-23 LAB — BASIC METABOLIC PANEL
ANION GAP: 11 (ref 5–15)
BUN: 13 mg/dL (ref 6–20)
CALCIUM: 9.2 mg/dL (ref 8.9–10.3)
CO2: 26 mmol/L (ref 22–32)
CREATININE: 0.69 mg/dL (ref 0.61–1.24)
Chloride: 103 mmol/L (ref 101–111)
GFR calc Af Amer: >60 mL/min (ref >60)
GLUCOSE: 106 mg/dL — AB (ref 65–99)
Potassium: 4.4 mmol/L (ref 3.5–5.1)
Sodium: 140 mmol/L (ref 135–145)

## 2016-04-23 MED ORDER — OXYCODONE-ACETAMINOPHEN 5-325 MG PO TABS
2.0000 | ORAL_TABLET | Freq: Once | ORAL | Status: AC
  Administered 2016-04-23: 2 via ORAL
  Filled 2016-04-23: qty 2

## 2016-04-23 MED ORDER — SODIUM CHLORIDE 0.9 % IV BOLUS (SEPSIS)
2000.0000 mL | Freq: Once | INTRAVENOUS | Status: AC
  Administered 2016-04-23: 2000 mL via INTRAVENOUS

## 2016-04-23 MED ORDER — OXYCODONE-ACETAMINOPHEN 5-325 MG PO TABS: ORAL_TABLET | ORAL | 0 refills | Status: DC

## 2016-04-23 NOTE — ED Notes (Signed)
PT requesting information on when he will be going home. This RN notified him he would be moved to his room very shortly as soon as the room was ready and a dr would be able to see him. Pt stated he doesn't care if the room is dirty he would like to be taken back now.

## 2016-04-23 NOTE — ED Notes (Signed)
ED Provider at bedside. 

## 2016-04-23 NOTE — ED Triage Notes (Addendum)
Per Pt, Pt is coming from home with complaints of intermittent dizziness, weakness, and falls x 6 months. Pt reports two falls in the last two days. Today, pt fell on the left side and hit his right elbow and shoulder. Denies LOC, but reports hitting head.

## 2016-04-23 NOTE — ED Notes (Signed)
Patient transported to CT 

## 2016-04-23 NOTE — ED Notes (Signed)
Patient transported to MRI 

## 2016-04-24 NOTE — ED Provider Notes (Signed)
[name] Arrival date & time: 04/23/16  1745     History   Chief Complaint Chief Complaint  Patient presents with  . Weakness  . Near Syncope    HPI Hector Byrd is a 61 y.o. male.   Weakness   Shoulder Pain   This is a new problem. The current episode started 1 to 2 hours ago. The problem occurs constantly. The problem has not changed since onset.The pain is present in the left shoulder. The quality of the pain is described as aching and sharp. The pain is moderate. Pertinent negatives include no numbness and full range of motion. He has tried nothing for the symptoms. The treatment provided no relief. There has been a history of trauma (fall at home).    Past Medical History:  Diagnosis Date  . CAD (coronary artery disease)    s/p stent x2 and then CABG 1996  . Cancer of kidney ([place])   . Cardiomyopathy, ischemic    EF 30% by MUGA s/p AICD  . Depression   . Dyslipidemia   . Erectile dysfunction   . GERD (gastroesophageal reflux disease)   . History of open heart surgery   . Hypertension   . Myocardial infarction   . Rotator cuff tear     Patient Active Problem List   Diagnosis Date Noted  . ICD (implantable cardioverter-defibrillator) in place 04/20/2014  . SOB (shortness of breath) 10/19/2013  . HTN (hypertension) 02/03/2013  . Cardiomyopathy, ischemic   . Erectile dysfunction   . CAD (coronary artery disease)   . Rotator cuff tear   . Depression   . Dyslipidemia   . Cancer of kidney [facility])     Past Surgical History:  Procedure Laterality Date  . CORONARY STENT PLACEMENT    . IMPLANTABLE CARDIOVERTER DEFIBRILLATOR IMPLANT  12-07-2012   BTK single chamber ICD implanted by Dr Lovena Le  . IMPLANTABLE CARDIOVERTER DEFIBRILLATOR IMPLANT N/A 12/07/2012   Procedure: IMPLANTABLE CARDIOVERTER DEFIBRILLATOR IMPLANT;  Surgeon: Evans Lance, MD;  Location: [facility] CATH LAB;  Service: Cardiovascular;  Laterality: N/A;  . ROTATOR  CUFF REPAIR         Home Medications    Prior to Admission medications   Medication Sig Start Date End Date Taking? Authorizing Provider  aspirin EC 81 MG tablet Take 81 mg by mouth daily at 12 noon.   Yes Historical Provider, MD  buprenorphine-naloxone (SUBOXONE) 8-2 MG SUBL SL tablet Place 0.5-1 tablets under the tongue See admin instructions. 1 tablet at noon, 1/2 tablet at 3pm and 7pm, 1 tablet at 11pm   Yes Historical Provider, MD  clonazePAM (KLONOPIN) 1 MG tablet Take 0.5 mg by mouth 2 (two) times daily.  09/23/12  Yes Historical Provider, MD  lamoTRIgine (LAMICTAL) 100 MG tablet Take 100 mg by mouth 2 (two) times daily. 6am and noon 09/14/12  Yes Historical Provider, MD  lisinopril (PRINIVIL,ZESTRIL) 10 MG tablet TAKE 1 TABLET BY MOUTH DAILY AT 12 NOON 03/20/15  Yes Sueanne Margarita, MD  naphazoline-pheniramine (NAPHCON-A) 0.025-0.3 % ophthalmic solution Place 1 drop into both eyes 2 (two) times daily.   Yes Historical Provider, MD  nitroGLYCERIN (NITROSTAT) 0.4 MG SL tablet Place 0.4 mg under the tongue every 5 (five) minutes as needed for chest pain.   Yes Historical Provider, MD  venlafaxine XR (EFFEXOR-XR) 150 MG 24 hr capsule Take 150 mg by mouth 2 (two) times daily. 6am and noon 09/26/12  Yes Historical Provider, MD  B-D INTEGRA SYRINGE 22G X 1-1/2" 3 ML MISC  12/25/12   Historical Provider, MD  oxyCODONE-acetaminophen (PERCOCET) 5-325 MG tablet Take 1 tablet every 6 hours as needed for pain 04/23/16   Merrily Pew, MD    Family History Family History  Problem Relation Age of Onset  . Diabetes Mother   . Cancer Father     Social History Social History  Substance Use Topics  . Smoking status: Current Every Day Smoker    Packs/day: 1.50    Years: 40.00    Types: Cigarettes  . Smokeless tobacco: Never Used     Comment: TRYING TO QUIT   . Alcohol use No     Comment: HISTORY OF DRUG & ETOH      Allergies   Patient has no known allergies.   Review of Systems Review of  Systems  Constitutional: Positive for unexpected weight change.  Neurological: Positive for weakness. Negative for numbness.  Psychiatric/Behavioral: Positive for dysphoric mood and sleep disturbance.  All other systems reviewed and are negative.    Physical Exam Updated Vital Signs BP 133/84   Pulse 86   Temp 98.2 F (36.8 C) (Oral)   Resp 19   Ht 5\' 8"  (1.727 m)   Wt 170 lb (77.1 kg)   SpO2 96%   BMI 25.85 kg/m   Physical Exam  Constitutional: He appears well-developed and well-nourished.  HENT:  Head: Normocephalic and atraumatic.  Eyes: Conjunctivae and EOM are normal.  Neck: Normal range of motion.  Cardiovascular: Normal rate.   Pulmonary/Chest: Effort normal. No respiratory distress.  Abdominal: Soft. He exhibits no distension.  Musculoskeletal: Normal range of motion. He exhibits tenderness (left proximal humerus) and deformity.  Neurological: He is alert. No cranial nerve deficit.  Skin: Skin is warm and dry.  Nursing note and vitals reviewed.    ED Treatments / Results  Labs (all labs ordered are listed, but only abnormal results are displayed) Labs Reviewed  BASIC METABOLIC PANEL - Abnormal; Notable for the following:       Result Value   Glucose, Bld 106 (*)    All other components within normal limits  CBC - Abnormal; Notable for the following:    WBC 16.2 (*)    All other components within normal limits  URINALYSIS, ROUTINE W REFLEX MICROSCOPIC - Abnormal; Notable for the following:    Color, Urine AMBER (*)    APPearance HAZY (*)    Leukocytes, UA TRACE (*)    All other components within normal limits  CBG MONITORING, ED    EKG  EKG Interpretation  Date/Time:  Tuesday April 23 2016 18:00:21 EDT Ventricular Rate:  111 PR Interval:  146 QRS Duration: 102 QT Interval:  336 QTC Calculation: 456 R Axis:   4 Text Interpretation:  Sinus tachycardia Possible Left atrial enlargement Inferior infarct , age undetermined Anterior infarct , age  undetermined Abnormal ECG Confirmed by [facility] MD, Corene Cornea [phone]) on 04/23/2016 9:37:08 PM Also confirmed by [facility] MD, Corene Cornea [phone]), editor [place] CT, Leda Gauze [phone])  on 04/24/2016 7:51:57 AM       Radiology Dg Chest 2 View  Result Date: 04/23/2016 CLINICAL DATA:  Acute onset of shortness of breath. Initial encounter. EXAM: CHEST  2 VIEW COMPARISON:  Chest radiograph performed 12/08/2012 FINDINGS: The lungs are well-aerated and clear. There is no evidence of focal opacification, pleural effusion or pneumothorax. The heart is borderline normal in size; the patient is status post median sternotomy. An AICD is noted at the  left chest wall, with a single lead ending at the right ventricle. No acute osseous abnormalities are seen. Chronic degenerative change is noted at both glenohumeral joints, with mild bony remodeling at the humeral heads. IMPRESSION: No acute cardiopulmonary process seen. Electronically Signed   By: Garald Balding M.D.   On: 04/23/2016 23:35   Dg Elbow 2 Views Left  Result Date: 04/23/2016 CLINICAL DATA:  [place] today with pain EXAM: LEFT ELBOW - 2 VIEW COMPARISON:  None. FINDINGS: There is no evidence of fracture, dislocation, or joint effusion. There is no evidence of arthropathy or other focal bone abnormality. Soft tissues are unremarkable. Vascular clips in the forearm. IMPRESSION: Negative. Electronically Signed   By: Nelson Chimes M.D.   On: 04/23/2016 19:12   Ct Head Wo Contrast  Result Date: 04/23/2016 CLINICAL DATA:  Multiple falls EXAM: CT HEAD WITHOUT CONTRAST TECHNIQUE: Contiguous axial images were obtained from the base of the skull through the vertex without intravenous contrast. COMPARISON:  None. FINDINGS: Brain: No mass lesion, intraparenchymal hemorrhage or extra-axial collection. No evidence of acute cortical infarct. There is an old lacunar infarct of the right caudate head. Brain parenchyma is otherwise normal for age. No age advanced or lobar predominant atrophy.  Vascular: No hyperdense vessel or unexpected calcification. Skull: Normal visualized skull base, calvarium and extracranial soft tissues. Sinuses/Orbits: No sinus fluid levels or advanced mucosal thickening. No mastoid effusion. Normal orbits. IMPRESSION: 1. No acute intracranial abnormality. 2. Old right caudate head lacunar infarct. Electronically Signed   By: Ulyses Jarred M.D.   On: 04/23/2016 23:23   Dg Shoulder Left  Result Date: 04/23/2016 CLINICAL DATA:  [place] today.  Pain. EXAM: LEFT SHOULDER - 2+ VIEW COMPARISON:  Chest radiography 12/08/2012 FINDINGS: Background pattern of advanced chronic arthropathy of the glenohumeral joint. Acute fracture of the surgical neck, mildly displaced and angulated based on the transscapular view. No other regional fracture. IMPRESSION: Acute surgical neck fracture, mildly displaced and angulated. Advanced pre-existing osteoarthritis. Electronically Signed   By: Nelson Chimes M.D.   On: 04/23/2016 19:14    Procedures Procedures (including critical care time)  Medications Ordered in ED Medications  sodium chloride 0.9 % bolus 2,000 mL (0 mLs Intravenous Stopped 04/24/16 0100)  oxyCODONE-acetaminophen (PERCOCET/ROXICET) 5-325 MG per tablet 2 tablet (2 tablets Oral Given 04/23/16 2253)     Initial Impression / Assessment and Plan / ED Course  I have reviewed the triage vital signs and the nursing notes.  Pertinent labs & imaging results that were available during my care of the patient were reviewed by me and considered in my medical decision making (see chart for details).     Dehydrated as likely cause for his weakness because of decreased intake because of depression (No SI/HI), also with left proximal humerus fracture, NVI. Neuro intact. Stable for dc. Will fu w/ ortho for fracture.  PCP for general health.    Final Clinical Impressions(s) / ED Diagnoses   Final diagnoses:  Dehydration  Other closed displaced fracture of proximal end of left  humerus, initial encounter    New Prescriptions Discharge Medication List as of 04/23/2016 11:58 PM       Merrily Pew, MD 04/24/16 2027

## 2016-04-25 ENCOUNTER — Encounter: Payer: Self-pay | Admitting: Cardiology

## 2016-06-05 DIAGNOSIS — F1121 Opioid dependence, in remission: Secondary | ICD-10-CM | POA: Diagnosis not present

## 2016-06-19 DIAGNOSIS — M25512 Pain in left shoulder: Secondary | ICD-10-CM | POA: Diagnosis not present

## 2016-06-27 ENCOUNTER — Other Ambulatory Visit: Payer: Self-pay | Admitting: Orthopedic Surgery

## 2016-07-01 ENCOUNTER — Telehealth: Payer: Self-pay

## 2016-07-01 NOTE — Telephone Encounter (Signed)
Received clearance request from [place] for shoulder procedure. Scheduled patient tomorrow, 6/18 for clearance OV as he has not been in the office for over 2 years. Patient agrees with treatment plan.  Clearance placed in [place] [facility]'s box.

## 2016-07-02 ENCOUNTER — Encounter: Payer: Self-pay | Admitting: Physician Assistant

## 2016-07-02 ENCOUNTER — Ambulatory Visit (INDEPENDENT_AMBULATORY_CARE_PROVIDER_SITE_OTHER): Payer: Federal, State, Local not specified - PPO | Admitting: Physician Assistant

## 2016-07-02 ENCOUNTER — Ambulatory Visit (INDEPENDENT_AMBULATORY_CARE_PROVIDER_SITE_OTHER): Payer: Federal, State, Local not specified - PPO | Admitting: *Deleted

## 2016-07-02 VITALS — BP 126/70 | HR 110 | Ht 68.0 in | Wt 153.0 lb

## 2016-07-02 DIAGNOSIS — Z95810 Presence of automatic (implantable) cardiac defibrillator: Secondary | ICD-10-CM

## 2016-07-02 DIAGNOSIS — I255 Ischemic cardiomyopathy: Secondary | ICD-10-CM

## 2016-07-02 DIAGNOSIS — I2510 Atherosclerotic heart disease of native coronary artery without angina pectoris: Secondary | ICD-10-CM

## 2016-07-02 DIAGNOSIS — I420 Dilated cardiomyopathy: Secondary | ICD-10-CM | POA: Diagnosis not present

## 2016-07-02 DIAGNOSIS — I5022 Chronic systolic (congestive) heart failure: Secondary | ICD-10-CM

## 2016-07-02 DIAGNOSIS — I10 Essential (primary) hypertension: Secondary | ICD-10-CM | POA: Diagnosis not present

## 2016-07-02 DIAGNOSIS — Z01810 Encounter for preprocedural cardiovascular examination: Secondary | ICD-10-CM

## 2016-07-02 DIAGNOSIS — R55 Syncope and collapse: Secondary | ICD-10-CM

## 2016-07-02 LAB — CUP PACEART INCLINIC DEVICE CHECK
Brady Statistic RV Percent Paced: 0 %
HighPow Impedance: 85 Ohm
Implantable Lead Implant Date: 20141124
Implantable Lead Serial Number: 10552343
Implantable Pulse Generator Implant Date: 20141124
Lead Channel Pacing Threshold Pulse Width: 0.4 ms
Lead Channel Sensing Intrinsic Amplitude: 24.2 mV
Lead Channel Setting Sensing Sensitivity: 0.8 mV
MDC IDC LEAD LOCATION: 753860
MDC IDC LEAD MODEL: 365501
MDC IDC MSMT BATTERY VOLTAGE: 3.12 V
MDC IDC MSMT LEADCHNL RA SENSING INTR AMPL: 27.5 mV
MDC IDC MSMT LEADCHNL RV IMPEDANCE VALUE: 680 Ohm
MDC IDC MSMT LEADCHNL RV PACING THRESHOLD AMPLITUDE: 0.5 V
MDC IDC SESS DTM: 20180619170812
MDC IDC SET LEADCHNL RV PACING PULSEWIDTH: 0.4 ms
Pulse Gen Serial Number: 60740583

## 2016-07-02 MED ORDER — METOPROLOL SUCCINATE ER 25 MG PO TB24: 12.5000 mg | ORAL_TABLET | Freq: Every day | ORAL | 1 refills | Status: DC

## 2016-07-02 MED ORDER — LISINOPRIL 10 MG PO TABS: ORAL_TABLET | ORAL | 3 refills | Status: DC

## 2016-07-02 MED ORDER — LISINOPRIL 5 MG PO TABS: 5.0000 mg | ORAL_TABLET | Freq: Every day | ORAL | 3 refills | Status: DC

## 2016-07-02 NOTE — Progress Notes (Signed)
Cardiology Office Note    Date:  07/02/2016  ID:  Hector Byrd, DOB 01-01-1956, MRN [id number] PCP:  Hector Levels, NP (Inactive)  Cardiologist:  Dr. Radford Byrd EP: Dr. Lovena Byrd   Chief Complaint: pre-op evaluation, dizziness, passing out  History of Present Illness:  Hector Byrd is a 61 y.o. male with history of CAD s/p prior stenting then CABG 1996, HTN, ICM/chronic systolic CHF s/p Biotronik ICD 2014, erectile dysfunction, dyslipidemia, kidney cancer, chronic DOE who presents for pre-op evaluation. He has not been evaluated by Dr. Radford Byrd since 2015 or Dr. Lovena Byrd since 2016. Last echo 10/2012 showed mild focal basal hypretorphy of septum, LVEF 30-35%, akinesis of inferoseptal myocardium, grade 1 DD, mild AI/mild MR, mod LAE. Last device check 03/2016 was stable. Last nuc in 09/2012 showed prior infarct/scar but no ischemia, EF 24%. Last cath 2005 showed patent RIMA-PDA, occluded radial graft to the left system. Last available labs 04/2016 showed Hgb 15.3, K 4.4, glucose 106, Cr 0.69. Last OV 10/2013 with Dr. Radford Byrd for SOB -> BNP wnl. Prior notes from Dr. Radford Byrd indicate low BP spells, and last HR at time of Dr. Lovena Byrd eval was 105.  He presents back to clinic today for pre-operative clearance for shoulder surgery. In the last 6 months, he has had 2 episodes of syncope and several episodes of dizziness. He can't quite recall the details of the first syncope, but the second episode happened when he was "hustling" while cleaning house. He passed out without warning and fell against the wall, injuring his shoulder. This happened in April. He was found to have a fracture of his left surgical neck, mildly displaced and angulated. He has been in a sling since that time. Conservative management was undertaken but ultimately he was felt to benefit from reverse total shoulder replacement. He continues to note h/o orthostatic dizziness. No recent CP or SOB. He continues to smoke. He has lost approximately 40lb since  the last time seen in this office - states he is not eating well since being separated from girlfriend/fiancee of 9 years several months ago. Remote device check 04/05/16 showed normal function without acute finding. Repeat interrogation last night reviewed by device clinic reveals normal device function and no interim sustained ventricular arrhythmias or shocks, only occasional PVCs recently, normal battery life.  Orthostatics as follows:  Lying HR 89, BP 130/79  Sitting HR 99 BP 123/80 Standing 36m HR 100, P 113/74 Standing 46m HR 101, BP 135/89   Past Medical History:  Diagnosis Date  . CAD (coronary artery disease)    a. s/p stent x2 and then CABG 1996.  . Cancer of kidney ([place])   . Cardiomyopathy, ischemic    EF 30% by MUGA s/p AICD  . Chronic systolic CHF (congestive heart failure) ([place])   . Depression   . Dyslipidemia   . Erectile dysfunction   . GERD (gastroesophageal reflux disease)   . Hypertension   . Myocardial infarction ([place])   . Rotator cuff tear   . S/P CABG (coronary artery bypass graft)     Past Surgical History:  Procedure Laterality Date  . CORONARY STENT PLACEMENT    . IMPLANTABLE CARDIOVERTER DEFIBRILLATOR IMPLANT  12-07-2012   BTK single chamber ICD implanted by Dr Hector Byrd  . IMPLANTABLE CARDIOVERTER DEFIBRILLATOR IMPLANT N/A 12/07/2012   Procedure: IMPLANTABLE CARDIOVERTER DEFIBRILLATOR IMPLANT;  Surgeon: Evans Lance, MD;  Location[facility] C[facility] LAB;  Service: Cardiovascular;  Laterality: N/A;  . ROTATOR CUFF REPAIR      Current  Medications: Current Outpatient Prescriptions  Medication Sig Dispense Refill  . aspirin EC 81 MG tablet Take 81 mg by mouth daily at 12 noon.    . B-D INTEGRA SYRINGE 22G X 1-1/2" 3 ML MISC     . buprenorphine-naloxone (SUBOXONE) 8-2 MG SUBL SL tablet Place 0.5-1 tablets under the tongue See admin instructions. 1 tablet at noon, 1/2 tablet at 3pm and 7pm, 1 tablet at 11pm    . clonazePAM (KLONOPIN) 1 MG tablet Take 0.5 mg by mouth  2 (two) times daily.     Marland Kitchen lamoTRIgine (LAMICTAL) 100 MG tablet Take 100 mg by mouth 2 (two) times daily. 6am and noon    . lisinopril (PRINIVIL,ZESTRIL) 10 MG tablet TAKE 1 TABLET BY MOUTH DAILY AT 12 NOON 90 tablet 3  . naphazoline-pheniramine (NAPHCON-A) 0.025-0.3 % ophthalmic solution Place 1 drop into both eyes 2 (two) times daily.    . nitroGLYCERIN (NITROSTAT) 0.4 MG SL tablet Place 0.4 mg under the tongue every 5 (five) minutes as needed for chest pain (MAX 3 TABLETS).     Marland Kitchen omeprazole (PRILOSEC) 20 MG capsule Take 20 mg by mouth daily as needed (heartburn).    . venlafaxine XR (EFFEXOR-XR) 150 MG 24 hr capsule Take 150 mg by mouth 2 (two) times daily. 6am and noon     No current facility-administered medications for this visit.      Allergies:   Patient has no known allergies.   Social History   Social History  . Marital status: Single    Spouse name: N/A  . Number of children: N/A  . Years of education: N/A   Social History Main Topics  . Smoking status: Current Every Day Smoker    Packs/day: 1.50    Years: 40.00    Types: Cigarettes  . Smokeless tobacco: Never Used     Comment: TRYING TO QUIT   . Alcohol use No     Comment: HISTORY OF DRUG & ETOH   . Drug use: No  . Sexual activity: Not Asked   Other Topics Concern  . None   Social History Narrative  . None     Family History:  Family History  Problem Relation Age of Onset  . Diabetes Mother   . Cancer Father     ROS:   Please see the history of present illness.  All other systems are reviewed and otherwise negative.    PHYSICAL EXAM:   VS:  BP 126/70   Pulse (!) 110   Ht 5\' 8"  (1.727 m)   Wt 153 lb (69.4 kg)   SpO2 95%   BMI 23.26 kg/m   BMI: Body mass index is 23.26 kg/m. GEN: Well nourished, well developed thin WM, in no acute distress  HEENT: normocephalic, atraumatic Neck: no JVD, carotid bruits, or masses Cardiac: RRR; no murmurs, rubs, or gallops, no edema  Respiratory: coarse BS  bilaterally with rare wheeze, normal work of breathing GI: soft, nontender, nondistended, + BS MS: no deformity or atrophy  Skin: warm and dry, no rash Neuro:  Alert and Oriented x 3, Strength and sensation are intact, follows commands Psych: euthymic mood, full affect  Wt Readings from Last 3 Encounters:  07/02/16 153 lb (69.4 kg)  04/23/16 170 lb (77.1 kg)  04/20/14 189 lb (85.7 kg)      Studies/Labs Reviewed:   EKG:  EKG was ordered today and personally reviewed by me and demonstrates sinus tach 110bpm, possible LAE, TWI inferiorly with prior inferior infarct,  TW flattening V6. Appears similar to prior.  Recent Labs: 04/23/2016: BUN 13; Creatinine, Ser 0.69; Hemoglobin 15.3; Platelets 269; Potassium 4.4; Sodium 140   Lipid Panel    Component Value Date/Time   CHOL 108 10/21/2013 1307   TRIG 179.0 (H) 10/21/2013 1307   HDL 20.10 (L) 10/21/2013 1307   CHOLHDL 5 10/21/2013 1307   VLDL 35.8 10/21/2013 1307   LDLCALC 52 10/21/2013 1307    Additional studies/ records that were reviewed today include: Summarized above.    ASSESSMENT & PLAN:   1. Pre-operative evaluation - see below. Unable to clear until further workup obtained. 2. Syncope/dizziness - reviewed with Dr. Curt Bears. His device interrogation did not show any suspicious arrhythmias. While it is possible that he could have had an arrhythmia below the detection zone, he also has history of low blood pressure in the past and was noted to have a drop in BP upon standing today. His dizziness is primarily orthostatic in nature. He did not become hypotensive today. He was tachycardic upon arrival to the clinic and becomes tachycardic upon standing. Per d/w Dr. Curt Bears, recommend to re-initiate to re initiate beta blocker - given suspected COPD in setting of longstanding tobacco abuse will resume Toprol 12.5mg  daily. To accomodate this addition, will decrease lisinopril to half tablet daily. Do not suspect he would be a candidate  for Entresto or Spironolactone at this time due to softer BP. Dr. Curt Bears does not know that we would get the information needed from a nuclear stress test given prior CABG and possibility of this revealing scar, thus have recommended a 2D echocardiogram for risk stratification. If LVEF is stable and no further syncopal events, would be cleared to proceed at moderate-high risk. If LVEF is down, will need further ischemic evaluation. Given baseline resting HR in the 90-100 range (although similar to last OV with Dr. Lovena Byrd several years ago), would recheck Hgb, TSH today. He also had increased PVCs over the last few days but is asymptomatic with this. Will check lytes. Also discussed recommendation from [facility] of no driving x 6 months following last episode of unexplained syncope. He verbalized understanding. 3. CAD s/p remote CABG - see above. Continue aspirin. Check LFTs. Plan resume statin if stable. Re-initiate low dose BB as above. Tobacco cessation and good nutrition strongly encouraged. 4. HTN - follow with med adjustment. 5. Chronic systolic CHF - appears euvolemic. Plan for 2D echocardiogram.  Disposition: F/u with Dr. Radford Byrd or care team APP after echo for further review.   Medication Adjustments/Labs and Tests Ordered: Current medicines are reviewed at length with the patient today.  Concerns regarding medicines are outlined above. Medication changes, Labs and Tests ordered today are summarized above and listed in the Patient Instructions accessible in Encounters.   Signed, Charlie Pitter, PA-C  07/02/2016 2:47 PM    [address], [place], [place]  [address] Phone: [phone]; Fax: [phone]

## 2016-07-02 NOTE — Progress Notes (Signed)
ICD check in clinic, added-on per Melina Copa, PA. Normal device function. Thresholds and sensing consistent with previous device measurements. Impedance trends stable over time. No evidence of any ventricular arrhythmias. No mode switches. PVC burden increased x2 days, patient asymptomatic. Histogram distribution appropriate for patient and level of activity. No changes made this session. Device programmed at appropriate safety margins. Device programmed to optimize intrinsic conduction. Remaining battery capacity 80%. Pt enrolled in remote follow-up. Patient education completed including shock plan. Home Monitoring on 07/09/16 as scheduled, overdue to see GT as of 04/2015 (msg sent to scheduler).

## 2016-07-02 NOTE — Patient Instructions (Signed)
Medication Instructions:  Your physician has recommended you make the following change in your medication:  1.  DECREASE the Lisinopril to 5 mg daily (1/2 of the 10 mg) 2.  START the Toprol XL 25 mg taking 1/2 tablet daily   Labwork: TODAY:  CMET, TSH, MAG, & CBC  Testing/Procedures: Your physician has requested that you have an echocardiogram ASAP. Echocardiography is a painless test that uses sound waves to create images of your heart. It provides your doctor with information about the size and shape of your heart and how well your heart's chambers and valves are working. This procedure takes approximately one hour. There are no restrictions for this procedure.    Follow-Up: Your physician recommends that you schedule a follow-up appointment in: AFTER THE ECHOCARDIOGRAM  Echocardiogram An echocardiogram, or echocardiography, uses sound waves (ultrasound) to produce an image of your heart. The echocardiogram is simple, painless, obtained within a short period of time, and offers valuable information to your health care provider. The images from an echocardiogram can provide information such as:  Evidence of coronary artery disease (CAD).  Heart size.  Heart muscle function.  Heart valve function.  Aneurysm detection.  Evidence of a past heart attack.  Fluid buildup around the heart.  Heart muscle thickening.  Assess heart valve function.  Tell a health care provider about:  Any allergies you have.  All medicines you are taking, including vitamins, herbs, eye drops, creams, and over-the-counter medicines.  Any problems you or family members have had with anesthetic medicines.  Any blood disorders you have.  Any surgeries you have had.  Any medical conditions you have.  Whether you are pregnant or may be pregnant. What happens before the procedure? No special preparation is needed. Eat and drink normally. What happens during the procedure?  In order to produce  an image of your heart, gel will be applied to your chest and a wand-like tool (transducer) will be moved over your chest. The gel will help transmit the sound waves from the transducer. The sound waves will harmlessly bounce off your heart to allow the heart images to be captured in real-time motion. These images will then be recorded.  You may need an IV to receive a medicine that improves the quality of the pictures. What happens after the procedure? You may return to your normal schedule including diet, activities, and medicines, unless your health care provider tells you otherwise. This information is not intended to replace advice given to you by your health care provider. Make sure you discuss any questions you have with your health care provider. Document Released: 12/29/1999 Document Revised: 08/19/2015 Document Reviewed: 09/07/2012 Elsevier Interactive Patient Education  2017 [employer].    Any Other Special Instructions Will Be Listed Below (If Applicable).     If you need a refill on your cardiac medications before your next appointment, please call your pharmacy.

## 2016-07-03 LAB — COMPREHENSIVE METABOLIC PANEL
ALK PHOS: 189 IU/L — AB (ref 39–117)
ALT: 19 IU/L (ref 0–44)
AST: 22 IU/L (ref 0–40)
Albumin/Globulin Ratio: 1.6 (ref 1.2–2.2)
Albumin: 4.7 g/dL (ref 3.6–4.8)
BILIRUBIN TOTAL: 0.4 mg/dL (ref 0.0–1.2)
BUN/Creatinine Ratio: 15 (ref 10–24)
BUN: 13 mg/dL (ref 8–27)
CHLORIDE: 104 mmol/L (ref 96–106)
CO2: 22 mmol/L (ref 20–29)
CREATININE: 0.84 mg/dL (ref 0.76–1.27)
Calcium: 10 mg/dL (ref 8.6–10.2)
GFR calc Af Amer: 108 mL/min/{1.73_m2} (ref >59)
GFR calc non Af Amer: 93 mL/min/{1.73_m2} (ref >59)
GLUCOSE: 75 mg/dL (ref 65–99)
Globulin, Total: 2.9 g/dL (ref 1.5–4.5)
Potassium: 4.7 mmol/L (ref 3.5–5.2)
Sodium: 144 mmol/L (ref 134–144)
Total Protein: 7.6 g/dL (ref 6.0–8.5)

## 2016-07-03 LAB — CBC
HEMATOCRIT: 50.9 % (ref 37.5–51.0)
HEMOGLOBIN: 18.1 g/dL — AB (ref 13.0–17.7)
MCH: 31.9 pg (ref 26.6–33.0)
MCHC: 35.6 g/dL (ref 31.5–35.7)
MCV: 90 fL (ref 79–97)
Platelets: 320 10*3/uL (ref 150–379)
RBC: 5.67 x10E6/uL (ref 4.14–5.80)
RDW: 13.8 % (ref 12.3–15.4)
WBC: 13.2 10*3/uL — ABNORMAL HIGH (ref 3.4–10.8)

## 2016-07-03 LAB — TSH: TSH: 1.93 u[IU]/mL (ref 0.450–4.500)

## 2016-07-03 LAB — MAGNESIUM: MAGNESIUM: 2 mg/dL (ref 1.6–2.3)

## 2016-07-04 ENCOUNTER — Telehealth: Payer: Self-pay | Admitting: Cardiology

## 2016-07-04 ENCOUNTER — Ambulatory Visit (HOSPITAL_COMMUNITY): Payer: Federal, State, Local not specified - PPO | Attending: Cardiology

## 2016-07-04 ENCOUNTER — Other Ambulatory Visit: Payer: Self-pay

## 2016-07-04 ENCOUNTER — Ambulatory Visit: Payer: Federal, State, Local not specified - PPO | Admitting: Cardiology

## 2016-07-04 DIAGNOSIS — I5022 Chronic systolic (congestive) heart failure: Secondary | ICD-10-CM | POA: Diagnosis not present

## 2016-07-04 DIAGNOSIS — Z951 Presence of aortocoronary bypass graft: Secondary | ICD-10-CM | POA: Insufficient documentation

## 2016-07-04 DIAGNOSIS — I110 Hypertensive heart disease with heart failure: Secondary | ICD-10-CM | POA: Diagnosis not present

## 2016-07-04 DIAGNOSIS — R55 Syncope and collapse: Secondary | ICD-10-CM | POA: Insufficient documentation

## 2016-07-04 DIAGNOSIS — I77810 Thoracic aortic ectasia: Secondary | ICD-10-CM | POA: Diagnosis not present

## 2016-07-04 DIAGNOSIS — I10 Essential (primary) hypertension: Secondary | ICD-10-CM | POA: Diagnosis not present

## 2016-07-04 DIAGNOSIS — R29898 Other symptoms and signs involving the musculoskeletal system: Secondary | ICD-10-CM | POA: Diagnosis not present

## 2016-07-04 DIAGNOSIS — I348 Other nonrheumatic mitral valve disorders: Secondary | ICD-10-CM | POA: Insufficient documentation

## 2016-07-04 DIAGNOSIS — Z01818 Encounter for other preprocedural examination: Secondary | ICD-10-CM

## 2016-07-04 MED ORDER — PERFLUTREN LIPID MICROSPHERE
1.0000 mL | INTRAVENOUS | Status: AC | PRN
  Administered 2016-07-04: 2 mL via INTRAVENOUS

## 2016-07-04 NOTE — Telephone Encounter (Signed)
New message    They have questions on the surgical clearance that you sent them, does he need to follow up with after his echo today or will you let him know  If he is clear ?

## 2016-07-04 NOTE — Telephone Encounter (Signed)
Tried to call pt re: cardiac clearance for shoulder sx. Per Ellen Henri, PA-C, if pt hasn't had any further syncope episodes, can clear him with moderate-high risk for sx.  lmptcb jw 07/04/16

## 2016-07-05 ENCOUNTER — Encounter: Payer: Self-pay | Admitting: Internal Medicine

## 2016-07-05 ENCOUNTER — Encounter (HOSPITAL_COMMUNITY): Payer: Self-pay

## 2016-07-05 ENCOUNTER — Encounter (HOSPITAL_COMMUNITY)
Admission: RE | Admit: 2016-07-05 | Discharge: 2016-07-05 | Disposition: A | Discharge status: Home / Self Care | Payer: Federal, State, Local not specified - PPO | Source: Ambulatory Visit | Attending: Orthopedic Surgery | Admitting: Orthopedic Surgery

## 2016-07-05 DIAGNOSIS — S42202S Unspecified fracture of upper end of left humerus, sequela: Secondary | ICD-10-CM | POA: Insufficient documentation

## 2016-07-05 DIAGNOSIS — X58XXXS Exposure to other specified factors, sequela: Secondary | ICD-10-CM | POA: Diagnosis not present

## 2016-07-05 DIAGNOSIS — Z01818 Encounter for other preprocedural examination: Secondary | ICD-10-CM | POA: Diagnosis not present

## 2016-07-05 HISTORY — DX: Unspecified osteoarthritis, unspecified site: M19.90

## 2016-07-05 HISTORY — DX: Presence of automatic (implantable) cardiac defibrillator: Z95.810

## 2016-07-05 HISTORY — DX: Anxiety disorder, unspecified: F41.9

## 2016-07-05 HISTORY — DX: Dyspnea, unspecified: R06.00

## 2016-07-05 LAB — COMPREHENSIVE METABOLIC PANEL
ALK PHOS: 157 U/L — AB (ref 38–126)
ALT: 21 U/L (ref 17–63)
AST: 27 U/L (ref 15–41)
Albumin: 4.3 g/dL (ref 3.5–5.0)
Anion gap: 12 (ref 5–15)
BUN: 11 mg/dL (ref 6–20)
CALCIUM: 9.6 mg/dL (ref 8.9–10.3)
CHLORIDE: 106 mmol/L (ref 101–111)
CO2: 21 mmol/L — AB (ref 22–32)
CREATININE: 0.86 mg/dL (ref 0.61–1.24)
GFR calc non Af Amer: >60 mL/min (ref >60)
Glucose, Bld: 122 mg/dL — ABNORMAL HIGH (ref 65–99)
Potassium: 4 mmol/L (ref 3.5–5.1)
SODIUM: 139 mmol/L (ref 135–145)
Total Bilirubin: 0.7 mg/dL (ref 0.3–1.2)
Total Protein: 7.4 g/dL (ref 6.5–8.1)

## 2016-07-05 LAB — URINALYSIS, ROUTINE W REFLEX MICROSCOPIC
Glucose, UA: NEGATIVE mg/dL
HGB URINE DIPSTICK: NEGATIVE
KETONES UR: 5 mg/dL — AB
Nitrite: NEGATIVE
PROTEIN: 30 mg/dL — AB
SPECIFIC GRAVITY, URINE: 1.031 — AB (ref 1.005–1.030)
SQUAMOUS EPITHELIAL / LPF: NONE SEEN
pH: 5 (ref 5.0–8.0)

## 2016-07-05 LAB — CBC WITH DIFFERENTIAL/PLATELET
BASOS ABS: 0 10*3/uL (ref 0.0–0.1)
Basophils Relative: 0 %
EOS ABS: 0.4 10*3/uL (ref 0.0–0.7)
Eosinophils Relative: 2 %
HCT: 50.7 % (ref 39.0–52.0)
HEMOGLOBIN: 18 g/dL — AB (ref 13.0–17.0)
LYMPHS ABS: 2.8 10*3/uL (ref 0.7–4.0)
LYMPHS PCT: 17 %
MCH: 32.7 pg (ref 26.0–34.0)
MCHC: 35.5 g/dL (ref 30.0–36.0)
MCV: 92.2 fL (ref 78.0–100.0)
Monocytes Absolute: 1.2 10*3/uL — ABNORMAL HIGH (ref 0.1–1.0)
Monocytes Relative: 8 %
NEUTROS PCT: 73 %
Neutro Abs: 12.1 10*3/uL — ABNORMAL HIGH (ref 1.7–7.7)
Platelets: 304 10*3/uL (ref 150–400)
RBC: 5.5 MIL/uL (ref 4.22–5.81)
RDW: 13.2 % (ref 11.5–15.5)
WBC: 16.5 10*3/uL — AB (ref 4.0–10.5)

## 2016-07-05 LAB — APTT: APTT: 36 s (ref 24–36)

## 2016-07-05 LAB — SURGICAL PCR SCREEN
MRSA, PCR: NEGATIVE
STAPHYLOCOCCUS AUREUS: POSITIVE — AB

## 2016-07-05 LAB — PROTIME-INR
INR: 1.06
Prothrombin Time: 13.8 seconds (ref 11.4–15.2)

## 2016-07-05 NOTE — Progress Notes (Signed)
I called a prescription for Mupirocin ointment to [facility].

## 2016-07-05 NOTE — Progress Notes (Signed)
Hector Hector Byrd denies chest pain or shortness of breath.  I told patient that Dr Theodosia Blender office tried to reach him after Echo to see if he has any other symptoms, I gave patient for him the  number to call.  I instructed patient that anesthesiologist have asked that patient stop Suboxone 3 days prior to surgery, to assist with pain control after surgery. I instructed patient that he should speak to prescribing physician.  Hector Byrd respondes to this information, "I cant get that a group of doctors can sit around and make decisions without having any idea about it."  I explained to patient that they just ask to speck with his Dr, they are not cancelling surgery.  Patient said that he cant just call the MD, it is my psychiatrist.  Patient later said that he has spoken to his counselor about the surgery and how he plans to handle it, he said he will take Suboxone  Until the day of surgery.  I asked patinet if he spoke with Dr Hector Byrd about pain medication after surgery, he said that he (patient) told Dr Hector Byrd that he wants to be comfortable. Chart left to be reviewed by PA or NP for anesthesia.

## 2016-07-05 NOTE — Pre-Procedure Instructions (Addendum)
Hector Byrd  07/05/2016    Your procedure is scheduled on Thursday, June 28.  Report to [facility] Admitting at 5:30 AM                Your surgery or procedure is scheduled for 7:30 AM     Call this number if you have problems the morning of surgery: [phone]            For any other questions, please call [phone], Monday - Friday 8 AM - 4 PM.   Remember:  Do not eat food or drink liquids after midnight Wednesday, June 27.  Take these medicines the morning of surgery with A SIP OF WATER :  lamoTRIgine (LAMICTAL), metoprolol succinate (TOPROL XL), venlafaxine XR (EFFEXOR-XR) .               May take if needed: omeprazole (PRILOSEC),  nitroGLYCERIN (NITROSTAT)                 1 Week prior to surgery STOP taking Aspirin, Aspirin Products (Goody Powder, Excedrin Migraine), Ibuprofen (Advil), Naproxen (Aleve), Vitamis and Herbal Products (ie Fish Oil)                 3 Days prior to surgery stop taking buprenorphine-naloxone (SUBOXONE).  (this is done to help with pain control post operatively)Discuss this with your prescribing provider.  Special Instructions: [place]- Preparing For Surgery  Before surgery, you can play an important role. Because skin is not sterile, your skin needs to be as free of germs as possible. You can reduce the number of germs on your skin by washing with CHG (chlorahexidine gluconate) Soap before surgery.  CHG is an antiseptic cleaner which kills germs and bonds with the skin to continue killing germs even after washing.  Please do not use if you have an allergy to CHG or antibacterial soaps. If your skin becomes reddened/irritated stop using the CHG.  Do not shave (including legs and underarms) for at least 48 hours prior to first CHG shower. It is OK to shave your face.  Please follow these instructions carefully.   1. Shower the NIGHT BEFORE SURGERY and the MORNING OF SURGERY with CHG.   2. If you chose to wash your hair, wash your  hair first as usual with your normal shampoo.  3. After you shampoo, rinse your hair and body thoroughly to remove the shampoo. Wash your face and private area with your normal soap and rinse.  4. Use CHG as you would any other liquid soap. You can apply CHG directly to the skin and wash gently with a scrungie or a clean washcloth.   5. Apply the CHG Soap to your body ONLY FROM THE NECK DOWN.  Do not use on open wounds or open sores. Avoid contact with your eyes, ears, mouth and genitals (private parts). Wash genitals (private parts) with your normal soap.  6. Wash thoroughly, paying special attention to the area where your surgery will be performed.  7. Thoroughly rinse your body with warm water from the neck down.  8. DO NOT shower/wash with your normal soap after using and rinsing off the CHG Soap.  9. Pat yourself dry with a CLEAN TOWEL.   10. Wear CLEAN PAJAMAS   11. Place CLEAN SHEETS on your bed the night of your first shower and DO NOT SLEEP WITH PETS.  Day of Surgery: Shower as Above Do not apply any deodorants/lotion,powders, or  colognes. Please wear clean clothes to the hospital/surgery center.             Do not wear jewelry, make-up or nail polish.  Do not shave 48 hours prior to surgery.  Men may shave face and neck.  Do not bring valuables to the hospital.  [facility] is not responsible for any belongings or valuables.  Contacts, dentures or bridgework may not be worn into surgery.  Leave your suitcase in the car.  After surgery it may be brought to your room.  For patients admitted to the hospital, discharge time will be determined by your treatment team.  Patients discharged the day of surgery will not be allowed to drive home.   Please read over the following fact sheets that you were given: Pain Booklet, Patient Instructions for Mupirocin Application, Incentive Spirometry, Surgical Site Infections.

## 2016-07-08 ENCOUNTER — Encounter (HOSPITAL_COMMUNITY): Payer: Self-pay | Admitting: Vascular Surgery

## 2016-07-08 ENCOUNTER — Other Ambulatory Visit: Payer: Self-pay | Admitting: *Deleted

## 2016-07-08 NOTE — Progress Notes (Signed)
Anesthesia Chart Review: Patient is a 61 year old male scheduled for left reverse total shoulder replacement on 07/11/16 by Dr. Tamera Punt.  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   - PCP is listed as Eloise Levels, NP.   - Cardiologist is Dr. Fransico Him. Patient was last seen on 07/02/16 by Melina Copa, PA-C for pre-operative evaluation. He had reported several episodes of dizziness and 2 episodes of syncope over the previous 6 months. In light of his symptoms, she reviewed device interrogation with Dr. Curt Bears which did not show any suspicious arrhythmias. He had a degree of orthostatic hypotension, and he was slightly tachycardic. He was started on a low dose beta blocker (for tachycardia) and lisinopril dose was decreased (to off set effect on BP). TSH (normal 1.930) and HGB (18.1) checked. Echo was also ordered showing stable EF 30-35% with inferior wall motion abnormality (known), but also new anterolateral abnormality. From 07/04/16, there is a telephone encounter by J. Witty, RMA indicating that per Ellen Henri, PA-C, patient could be cleared with "moderate-high risk" if no further syncope episodes; however on 07/07/16 there is another notation by Melina Copa that per Dr. Radford Pax, patient will need a Lexiscan Myoview prior to giving cardiac clearance.  - EP is Dr. Cristopher Peru.  Meds include ASA 81 mg, Suboxone, clonazepam, Lamictal, lisinopril, Toprol XL, Naphcon-A ophthalmic, Nitro, Prilosec, Effexor-XR. (Per anesthesia guidelines, PAT RN discussed having patient hold Suboxone for 3 days prior to surgery [if  prescribing provider in agreement] to decreased post-operative narcotic needs. He apparently was every upset about this, but ultimately said that he discussed with his counselor and he will plan to continue Suboxone up until the morning of surgery.)  BP (!) 144/83   Pulse (!) 102   Temp 36.7 C   Resp 18   Ht 5\' 8"  (1.727 m)   Wt 150 lb 4.8 oz (68.2 kg)   SpO2 94%   BMI 22.85 kg/m   EKG 07/02/16: ST at 110 bpm, possible LAE, inferior infarct (age undetermined.)  ICD Device interrogation 07/02/16: Normal device function. Thresholds and sensing consistent with previous device measurements. Impedance trends stable over time. No evidence of any ventricular arrhythmias. No mode switches. PVC burden increased x2 days, patient asymptomatic. Histogram distribution appropriate for patient and level of activity. No changes made this session. Device programmed at appropriate safety margins. Device programmed to optimize intrinsic conduction. Remaining battery capacity 80%.   Echo 07/04/16: Study Conclusions - Left ventricle: The cavity size was normal. Wall thickness was   increased in a pattern of mild LVH. Inferolateral akinesis.   Anterolateral hypokinesis. Basal to mid inferoseptal akinesis.   Basal to mid inferior akinesis. Systolic function was moderately   to severely reduced. The estimated ejection fraction was in the   range of 30% to 35%. Doppler parameters are consistent with   abnormal left ventricular relaxation (grade 1 diastolic   dysfunction). - Aortic valve: There was no stenosis. There was trivial   regurgitation. - Aorta: Mildly dilated aortic root. Aortic root dimension: 37 mm   (ED). - Mitral valve: Mildly calcified annulus. There was no significant   regurgitation. - Right ventricle: The cavity size was normal.  Pacer wire or   catheter noted in right ventricle. Systolic function was mildly   reduced. - Pulmonary arteries: No complete TR doppler jet so unable to   estimate  PA systolic pressure. - Inferior vena cava: The vessel was normal in size. The   respirophasic diameter changes were in the normal range (>= 50%),   consistent with normal central venous pressure. Impressions: - Normal LV size with mild LV hypertrophy. Wall motion   abnormalities as noted above. EF 30-35%. Normal RV size with   mildly decreased systolic function. No significant valvular   abnormalities. (Comparison echo 10/15/12: EF 30-35%, akinesis of the entire inferoseptal myocardium.)  Nuclear stress test 10/09/12: Impression:  A moderate perfusion defect is seen in the basal inferior, mid inferior, apical inferior, basal inferolateral, mid inferolateral and apical lateral regions. This is consistent with an infarct/scar. There is no scintigraphic evidence of inducible myocardial ischemia. Global LV ventricular systolic function is severely reduced. The post-stress EF is 24%.   Cardiac cath 10/08/03 ([facility]; scanned in Results Review tab): FINDINGS:  1. RCA is a dominant vessel that is occluded after the take-off of the large marginal branch. 2. RIMA-PDA widely patent. 3. LM with mild irregularities. 4. LAD has mild to moderate irregularities but no severe stenosis or obstruction. 5. CX has mild irregularities but no severe stenosis or obstruction. 6. LV showed overall EF to be approximately 40% with severe inferior, inferobasal hypokinesis. 7. Radial graft artery that comes off the aorta is occluded. 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   CXR 04/23/16: IMPRESSION: No acute cardiopulmonary process seen.  Preoperative labs noted. WBC 16.5K. H/H 18.0/50.7. (Previously CBC showed WBC 16.2 and H/H 15.3/44.2 on 04/23/16, WBC 13.2 and H/H 18.1/50.9 on 07/02/16), PT/PTT WNL. Glucose 122.   I reviewed above cardiology input (need for stress test for clearance) with  Carla at Dr. Bettina Gavia office. Also discussed CBC results. Dr. Tamera Punt has already reviewed and is wanting patient to see a PCP for evaluation of leukocytosis prior to surgery. Proceeding as planned will depend on timing and results of stress test and PCP evaluation/recommendations.   George Hugh [facility] Short Stay Center/Anesthesiology Phone [phone] 07/08/2016 11:21 AM

## 2016-07-08 NOTE — Telephone Encounter (Addendum)
Order placed for lexiscan; see echo results for more detail/information.

## 2016-07-08 NOTE — Addendum Note (Signed)
Addended by: Rodman Key on: 07/08/2016 05:52 PM   Modules accepted: Orders

## 2016-07-08 NOTE — Telephone Encounter (Signed)
New message   Pt verbalized that he is upset because Dr.Turner said that he now needs a stress test  Right before his surgery

## 2016-07-08 NOTE — Telephone Encounter (Signed)
Returned call to patient.He stated he is scheduled to have shoulder replacement surgery this Thursday 07/11/16.Stated he is upset no one called him sooner to schedule stress test.Advised it is after 5 pm and our schedulers have left.Advised I will send a staff message to schedulers to schedule Lexiscan asap.Advised he may have to reschedule surgery.Stated he has made arrangements and needs to have stress test done. Dr.Chandler's office called left message Dr.Turner advised patient will need Lexiscan Myoview to clear him for surgery.

## 2016-07-09 ENCOUNTER — Telehealth (HOSPITAL_COMMUNITY): Payer: Self-pay | Admitting: Cardiology

## 2016-07-09 ENCOUNTER — Ambulatory Visit (INDEPENDENT_AMBULATORY_CARE_PROVIDER_SITE_OTHER): Payer: Federal, State, Local not specified - PPO | Admitting: *Deleted

## 2016-07-09 ENCOUNTER — Telehealth: Payer: Self-pay | Admitting: *Deleted

## 2016-07-09 DIAGNOSIS — I255 Ischemic cardiomyopathy: Secondary | ICD-10-CM | POA: Diagnosis not present

## 2016-07-09 DIAGNOSIS — I420 Dilated cardiomyopathy: Secondary | ICD-10-CM

## 2016-07-09 NOTE — Telephone Encounter (Signed)
addendum from previous phone note: (computer shut down in the middle of the note) Surgery is rescheduled for and we will communicate at that time whether pt has been cleared or not.  Hector Byrd was very appreciative of the call and follow-up.

## 2016-07-09 NOTE — Progress Notes (Signed)
Remote ICD transmission.   

## 2016-07-09 NOTE — Telephone Encounter (Signed)
Patient returned my call in attempt to set him for pre surgical myoview and he voiced that he would call back tomorrow because he was not sure what he was going to do.

## 2016-07-09 NOTE — Telephone Encounter (Signed)
Per Melina Copa, PA-C, I have called the surgeons office, [place], spoke with Florentina Jenny to make sure she has been aware that pt is not cleared from cardiac standpoint, that pt has to have a [place] informed me that the surgeon had her pull pts case already re: WBC and it being high.  Pt needs to be cleared from his PCP as well. Florentina Jenny was advised to let us know when he was cleared form PCP and when the actual date for the s

## 2016-07-10 ENCOUNTER — Encounter: Payer: Self-pay | Admitting: Cardiology

## 2016-07-11 ENCOUNTER — Encounter (HOSPITAL_COMMUNITY): Admission: RE | Payer: Self-pay | Source: Ambulatory Visit

## 2016-07-11 ENCOUNTER — Inpatient Hospital Stay (HOSPITAL_COMMUNITY)
Admission: RE | Admit: 2016-07-11 | Payer: Federal, State, Local not specified - PPO | Source: Ambulatory Visit | Admitting: Orthopedic Surgery

## 2016-07-11 SURGERY — ARTHROPLASTY, SHOULDER, TOTAL, REVERSE
Anesthesia: Choice | Laterality: Left

## 2016-07-12 ENCOUNTER — Telehealth (HOSPITAL_COMMUNITY): Payer: Self-pay | Admitting: Cardiology

## 2016-07-18 ENCOUNTER — Telehealth (HOSPITAL_COMMUNITY): Payer: Self-pay

## 2016-07-18 ENCOUNTER — Encounter (HOSPITAL_COMMUNITY): Payer: Federal, State, Local not specified - PPO

## 2016-07-18 NOTE — Telephone Encounter (Signed)
Patient left message at 5:08am this morning stating his car would not start and we would not make his appointment for Nuclear test today.  Patient stated he would call back to reschedule.    Janifer Adie, CNMT, RT-N

## 2016-07-18 NOTE — Telephone Encounter (Signed)
07/12/2016 02:20 PM Phone (Outgoing) [place], [place] (Self) [phone] (H)   Left Message - Returned patient's call from yesterday and lmsg asking him to CB to get scheduled for myoview.    By Cherie Dark A    07/15/2016 03:45 PM Phone ([place]) [place], [place] (Self) [phone] (H)   Left Message - Called pt and phone went straight to voicemail and lmsg asking that he call back to scheduled myoview.    By Cherie Dark A    07/16/2016 08:40 AM Phone ([place]) [place], T[place] (Self) [phone] (H)   Left Message - Called pt and lmsg for him to CB to get scheduled for myoview.     By Verdene Rio

## 2016-07-19 ENCOUNTER — Ambulatory Visit (INDEPENDENT_AMBULATORY_CARE_PROVIDER_SITE_OTHER): Payer: Federal, State, Local not specified - PPO

## 2016-07-19 ENCOUNTER — Ambulatory Visit (INDEPENDENT_AMBULATORY_CARE_PROVIDER_SITE_OTHER): Payer: Federal, State, Local not specified - PPO | Admitting: Family Medicine

## 2016-07-19 ENCOUNTER — Encounter: Payer: Self-pay | Admitting: Family Medicine

## 2016-07-19 VITALS — BP 118/74 | HR 94 | Temp 99.1°F | Resp 17 | Ht 65.75 in | Wt 149.2 lb

## 2016-07-19 DIAGNOSIS — R7309 Other abnormal glucose: Secondary | ICD-10-CM | POA: Diagnosis not present

## 2016-07-19 DIAGNOSIS — R350 Frequency of micturition: Secondary | ICD-10-CM | POA: Diagnosis not present

## 2016-07-19 DIAGNOSIS — D72829 Elevated white blood cell count, unspecified: Secondary | ICD-10-CM

## 2016-07-19 DIAGNOSIS — F17200 Nicotine dependence, unspecified, uncomplicated: Secondary | ICD-10-CM | POA: Diagnosis not present

## 2016-07-19 DIAGNOSIS — R05 Cough: Secondary | ICD-10-CM | POA: Diagnosis not present

## 2016-07-19 LAB — POCT URINALYSIS DIP (MANUAL ENTRY)
Blood, UA: NEGATIVE
Glucose, UA: NEGATIVE mg/dL
Leukocytes, UA: NEGATIVE
Nitrite, UA: NEGATIVE
PH UA: 5.5 (ref 5.0–8.0)
Urobilinogen, UA: 4 E.U./dL — AB

## 2016-07-19 LAB — POCT CBC
Granulocyte percent: 72.9 %G (ref 37–80)
HEMATOCRIT: 50 % (ref 43.5–53.7)
Hemoglobin: 17 g/dL (ref 14.1–18.1)
LYMPH, POC: 2.8 (ref 0.6–3.4)
MCH, POC: 31.5 pg — AB (ref 27–31.2)
MCHC: 34 g/dL (ref 31.8–35.4)
MCV: 92.7 fL (ref 80–97)
MID (CBC): 0.5 (ref 0–0.9)
MPV: 7.8 fL (ref 0–99.8)
POC GRANULOCYTE: 9 — AB (ref 2–6.9)
POC LYMPH %: 22.9 % (ref 10–50)
POC MID %: 4.2 % (ref 0–12)
Platelet Count, POC: 299 10*3/uL (ref 142–424)
RBC: 5.4 M/uL (ref 4.69–6.13)
RDW, POC: 13.7 %
WBC: 12.3 10*3/uL — AB (ref 4.6–10.2)

## 2016-07-19 LAB — POCT GLYCOSYLATED HEMOGLOBIN (HGB A1C): Hemoglobin A1C: 5.5

## 2016-07-19 LAB — POC MICROSCOPIC URINALYSIS (UMFC): Mucus: ABSENT

## 2016-07-19 LAB — POCT SEDIMENTATION RATE: POCT SED RATE: 9 mm/hr (ref 0–22)

## 2016-07-19 MED ORDER — AMOXICILLIN-POT CLAVULANATE 875-125 MG PO TABS: 1.0000 | ORAL_TABLET | Freq: Two times a day (BID) | ORAL | 0 refills | Status: DC

## 2016-07-19 NOTE — Patient Instructions (Addendum)
[facility] is now offering annual lung cancer screening by low-dose CT scan.  This is covered for qualifying patients and your insurance will be checked before the procedure.  Call the lung cancer screening nurse navigators at [phone] to learn more about this and get scheduled.     IF you received an x-ray today, you will receive an invoice from [facility]. Please contact [facility] at [phone] with questions or concerns regarding your invoice.   IF you received labwork today, you will receive an invoice from [facility]. Please contact [facility] at [phone] with questions or concerns regarding your invoice.   Our billing staff will not be able to assist you with questions regarding bills from these companies.  You will be contacted with the lab results as soon as they are available. The fastest way to get your results is to activate your My Chart account. Instructions are located on the last page of this paperwork. If you have not heard from [place] regarding the results in 2 weeks, please contact this office.     Leukocytosis Leukocytosis means that a person has more white blood cells than normal. White blood cells are made in the bone marrow. Bone marrow is the spongy tissue inside of bones. The main job of white blood cells is to fight infection. Having too many white blood cells is a common condition. It can develop as a result of many types of medical problems. What are the causes? This condition may be caused by various problems. In some cases, the bone marrow is normal but it is still making too many white blood cells. This could be the result of:  Infection.  Injury.  Physical stress.  Emotional stress.  Surgery.  Allergic reactions.  Tumors that do not start in the blood or bone marrow.  An inherited disease.  Certain medicines.  Pregnancy and labor.  In other cases, a person may have a bone marrow disorder that is causing the body to make  too many white blood cells. Bone marrow disorders include:  Leukemia. This is a type of blood cancer.  Myeloproliferative disorders. These disorders cause blood cells to grow abnormally.  What are the signs or symptoms? Often, this condition causes no symptoms. Some people may have symptoms due to the medical problem that is causing their leukocytosis. These symptoms may include:  Bleeding.  Bruising.  Fever.  Night sweats.  Repeated infections.  Weakness.  Weight loss.  How is this diagnosed? This condition is diagnosed with blood tests. It is often found when blood is tested as part of a routine physical exam. You may have other tests to help determine why you have too many white blood cells. These tests may include:  A complete blood count (CBC). This test measures all the types of blood cells in your body.  Chest X-rays, urine tests, or other tests to look for signs of infection.  Bone marrow aspiration. For this test, a needle is put into your bone. Cells from the bone marrow are removed through the needle, then they are examined under a microscope.  Other tests on the blood or bone marrow sample.  How is this treated? Usually, treatment is not needed for leukocytosis. However, if a disorder is causing your leukocytosis, it will need to be treated. Treatment may include:  Antibiotic medicine if you have a bacterial infection.  Bone marrow transplant. This treatment replaces your diseased bone marrow with healthy cells that will grow new bone marrow.  Chemotherapy or  biological therapies such as the use of antibodies. These treatments may be used to kill cancer cells or to decrease the number of white blood cells.  Follow these instructions at home: Medicines  Take over-the-counter and prescription medicines only as told by your health care provider.  If you were prescribed an antibiotic medicine, take it as told by your health care provider. Do not stop taking the  antibiotic even if you start to feel better. Eating and drinking  Eat foods that are low in saturated fats and high in fiber. Eat plenty of fruits and vegetables.  Drink enough fluid to keep your urine clear or pale yellow.  Limit your intake of caffeine and alcohol. General instructions  Maintain a healthy weight. Ask your health care provider what weight is best for you.  Do 30 minutes of exercise at least 5 times each week. Check with your health care provider before you start a new exercise routine.  Do not use tobacco products, including cigarettes, chewing tobacco, or e-cigarettes. If you need help quitting, ask your health care provider.  Keep all follow-up visits as told by your health care provider. This is important. Contact a health care provider if:  You feel weak or more tired than usual.  You develop chills, a cough, or nasal congestion.  You have a fever.  You lose weight without trying.  You have night sweats.  You bruise easily. Get help right away if:  You bleed more than normal.  You have chest pain.  You have trouble breathing.  You have uncontrolled nausea or vomiting.  You feel dizzy or light-headed. This information is not intended to replace advice given to you by your health care provider. Make sure you discuss any questions you have with your health care provider. Document Released: 12/20/2010 Document Revised: 06/08/2015 Document Reviewed: 07/04/2014 Elsevier Interactive Patient Education  [employer].

## 2016-07-19 NOTE — Progress Notes (Signed)
Subjective:    Patient ID: Hector Byrd, male    DOB: [birth year], 61 y.o.   MRN: [id number] Chief Complaint  Patient presents with  . New Patient (Initial Visit)    HPI  Was seeing Dr. Eloise Levels who was with [facility] and moved.   He needs left shoulder surgery. He fell and tore rotator cuff - he is arranged to have a total shoulder replacement. It was supposed to have happened 6/16 but he had leukocytosis which needs eval. He is now sched for 8/19  + tobacco use since 61 yo. Down to 3/4 ppd from 2 ppd  He is wondering if his right kidney Had 1.31mm renal carcinoma removed aroung 2004-5.  Urine has gotten bright and dark over the past few months. No odor. No dysuria. + urinary frequency, + nocturia of multiple times. + urinary hestiency and urgency, lots of dripping.  Bowels normal - steady. No f/c/nightsweats.  Insomnia -takes a long time to fall asleep.   No h/o protate problems or seeing urology.  No cough/congesiton, occ SHoB. No CP/pressure. No edema.   Teeth fell out and rotted since on Suboxone  Past Medical History:  Diagnosis Date  . AICD (automatic cardioverter/defibrillator) present   . Anxiety   . Arthritis   . CAD (coronary artery disease)    a. s/p stent x2 and then CABG 1996.  . Cancer of kidney ([employer])    Kidney   . Cardiomyopathy, ischemic    EF 30% by MUGA s/p AICD  . Chronic systolic CHF (congestive heart failure) ([place])   . Depression   . Dyslipidemia   . Dyspnea    with exertion  . Erectile dysfunction   . GERD (gastroesophageal reflux disease)   . Hypertension   . Myocardial infarction ([place])    [id number]  . Rotator cuff tear   . S/P CABG (coronary artery bypass graft)    Past Surgical History:  Procedure Laterality Date  . CARDIAC CATHETERIZATION    . Cardiac stents     prior to 2005- 3 times total  . COLONOSCOPY    . CORONARY ARTERY BYPASS GRAFT  2005  . CORONARY STENT PLACEMENT    . IMPLANTABLE CARDIOVERTER DEFIBRILLATOR IMPLANT   12-07-2012   BTK single chamber ICD implanted by Dr Lovena Le  . IMPLANTABLE CARDIOVERTER DEFIBRILLATOR IMPLANT N/A 12/07/2012   Procedure: IMPLANTABLE CARDIOVERTER DEFIBRILLATOR IMPLANT;  Surgeon: Evans Lance, MD;  Location[facility] LAB;  Service: Cardiovascular;  Laterality: N/A;  . Kidney resection     small amount of kidney  . ROTATOR CUFF REPAIR     Current Outpatient Prescriptions on File Prior to Visit  Medication Sig Dispense Refill  . aspirin EC 81 MG tablet Take 81 mg by mouth daily.     . buprenorphine-naloxone (SUBOXONE) 8-2 MG SUBL SL tablet Place 0.5-1 tablets under the tongue See admin instructions. Up to 4 times daily 1 tablet in the morning, 1/2 tablet at 10am and 5pm, 1 tablet at 8pm    . clonazePAM (KLONOPIN) 1 MG tablet Take 1-3 mg by mouth 2 (two) times daily. 1 mg by mouth in the morning & 3 mg at bedtime.    . lamoTRIgine (LAMICTAL) 100 MG tablet Take 100 mg by mouth 2 (two) times daily.     Marland Kitchen lisinopril (PRINIVIL,ZESTRIL) 5 MG tablet Take 1 tablet (5 mg total) by mouth daily. 90 tablet 3  . metoprolol succinate (TOPROL XL) 25 MG 24 hr tablet Take 0.5 tablets (12.5 mg  total) by mouth daily. 45 tablet 1  . naphazoline-pheniramine (NAPHCON-A) 0.025-0.3 % ophthalmic solution Place 1 drop into both eyes 2 (two) times daily.    . naproxen sodium (ANAPROX) 220 MG tablet Take 440 mg by mouth daily.    . nitroGLYCERIN (NITROSTAT) 0.4 MG SL tablet Place 0.4 mg under the tongue every 5 (five) minutes as needed for chest pain (MAX 3 TABLETS).     Marland Kitchen omeprazole (PRILOSEC) 20 MG capsule Take 20 mg by mouth daily as needed (heartburn).    . venlafaxine XR (EFFEXOR-XR) 150 MG 24 hr capsule Take 150 mg by mouth 2 (two) times daily.      No current facility-administered medications on file prior to visit.    No Known Allergies Family History  Problem Relation Age of Onset  . Diabetes Mother   . Cancer Father    Social History   Social History  . Marital status: Single    Spouse  name: N/A  . Number of children: N/A  . Years of education: N/A   Social History Main Topics  . Smoking status: Current Every Day Smoker    Packs/day: 0.50    Years: 40.00    Types: Cigarettes  . Smokeless tobacco: Never Used     Comment: uses cigarelle- 4 per day  . Alcohol use No     Comment: HISTORY OF DRUG & ETOH 09/19/2004  . Drug use: No     Comment: none since 09/19/2004  . Sexual activity: Not Asked   Other Topics Concern  . None   Social History Narrative  . None   Depression screen [facility] 2/9 07/19/2016  Decreased Interest 0  Down, Depressed, Hopeless 0  PHQ - 2 Score 0     Review of Systems     Objective:   Physical Exam   Left lung end exp rhonchi Teeth rotten    BP 118/74 (BP Location: Right Arm, Patient Position: Sitting, Cuff Size: Normal)   Pulse 94   Temp 99.1 F (37.3 C) (Oral)   Resp 17   Ht 5' 5.75" (1.67 m)   Wt 149 lb 3.2 oz (67.7 kg)   SpO2 94%   BMI 24.26 kg/m    Results for orders placed or performed in visit on 07/19/16  POCT CBC  Result Value Ref Range   WBC 12.3 (A) 4.6 - 10.2 K/uL   Lymph, poc 2.8 0.6 - 3.4   POC LYMPH PERCENT 22.9 10 - 50 %L   MID (cbc) 0.5 0 - 0.9   POC MID % 4.2 0 - 12 %M   POC Granulocyte 9.0 (A) 2 - 6.9   Granulocyte percent 72.9 37 - 80 %G   RBC 5.40 4.69 - 6.13 M/uL   Hemoglobin 17.0 14.1 - 18.1 g/dL   HCT, POC 50.0 43.5 - 53.7 %   MCV 92.7 80 - 97 fL   MCH, POC 31.5 (A) 27 - 31.2 pg   MCHC 34.0 31.8 - 35.4 g/dL   RDW, POC 13.7 %   Platelet Count, POC 299 142 - 424 K/uL   MPV 7.8 0 - 99.8 fL  POCT glycosylated hemoglobin (Hb A1C)  Result Value Ref Range   Hemoglobin A1C 5.5   POCT Microscopic Urinalysis (UMFC)  Result Value Ref Range   WBC,UR,HPF,POC Many (A) None WBC/hpf   RBC,UR,HPF,POC None None RBC/hpf   Bacteria None None, Too numerous to count   Mucus Absent Absent   Epithelial Cells, UR Per Microscopy Few (A)  None, Too numerous to count cells/hpf  POCT urinalysis dipstick  Result Value  Ref Range   Color, UA yellow yellow   Clarity, UA clear clear   Glucose, UA negative negative mg/dL   Bilirubin, UA moderate (A) negative   Ketones, POC UA small (15) (A) negative mg/dL   Spec Grav, UA >=1.030 (A) 1.010 - 1.025   Blood, UA negative negative   pH, UA 5.5 5.0 - 8.0   Protein Ur, POC =100 (A) negative mg/dL   Urobilinogen, UA 4.0 (A) 0.2 or 1.0 E.U./dL   Nitrite, UA Negative Negative   Leukocytes, UA Negative Negative   Dg Chest 2 View  Result Date: 07/19/2016 CLINICAL DATA:  Cough, leukocytosis, left long rhonchi EXAM: CHEST  2 VIEW COMPARISON:  None FINDINGS: Lungs are clear.  No pleural effusion or pneumothorax. The heart is normal in size. Postsurgical changes related to prior CABG. Left subclavian ICD. Median sternotomy. IMPRESSION: No evidence of acute cardiopulmonary disease. Electronically Signed   By: Julian Hy M.D.   On: 07/19/2016 12:55     Assessment & Plan:  Many white blood cells noted in the urine though no gross sites on the urine dip. White count appears proved from prior. Polycythemia resolved. 1. Leukocytosis, unspecified type   2. Urinary frequency   3. Tobacco use disorder   4. Elevated glucose     Orders Placed This Encounter  Procedures  . Urine Culture  . DG Chest 2 View    Standing Status:   Future    Number of Occurrences:   1    Standing Expiration Date:   07/19/2017    Order Specific Question:   Reason for Exam (SYMPTOM  OR DIAGNOSIS REQUIRED)    Answer:   cough, leukocytosis, left lung rhonchi, tobacco use    Order Specific Question:   Preferred imaging location?    Answer:   External  . C-reactive protein  . PSA  . POCT CBC  . POCT glycosylated hemoglobin (Hb A1C)  . POCT SEDIMENTATION RATE  . POCT Microscopic Urinalysis (UMFC)  . POCT urinalysis dipstick    Meds ordered this encounter  Medications  . amoxicillin-clavulanate (AUGMENTIN) 875-125 MG tablet    Sig: Take 1 tablet by mouth 2 (two) times daily.     Dispense:  28 tablet    Refill:  0    Delman Cheadle, M.D.  Primary Care at [facility] [address] [place],  [address] [phone] phone [phone] fax  07/22/16 8:05 AM

## 2016-07-20 LAB — PSA: PROSTATE SPECIFIC AG, SERUM: 1.1 ng/mL (ref 0.0–4.0)

## 2016-07-20 LAB — URINE CULTURE: ORGANISM ID, BACTERIA: NO GROWTH

## 2016-07-20 LAB — C-REACTIVE PROTEIN: CRP: 5.2 mg/L — AB (ref 0.0–4.9)

## 2016-07-22 ENCOUNTER — Telehealth: Payer: Self-pay | Admitting: Cardiology

## 2016-07-22 NOTE — Telephone Encounter (Signed)
LMTCB--looks like pt missed lexiscan myoview appt scheduled for 07/18/16.

## 2016-07-22 NOTE — Telephone Encounter (Signed)
Patient calling, states that at last his most recent echo 07-04-16, he was told that he needed to have another echocardiogram. Please verify if this is correct.

## 2016-07-23 ENCOUNTER — Other Ambulatory Visit: Payer: Self-pay | Admitting: Orthopedic Surgery

## 2016-07-23 NOTE — Telephone Encounter (Signed)
I returned pt's call from yesterday, see phone note. Pt was wanting to confirm if he needed another echocardiogram. Pt said he was told he needed another echo. I advised pt that he does not another echo and that the recommendation was per Dr. Radford Pax that pt will need to have a Lexiscan before he can be cleared for his shoulder surgery. Pt has been rescheduled for his Lexiscan 07/30/16 10 am. Pt thanked me for my follow up call today. I advised pt if he has any other questions to please call the office.

## 2016-07-25 ENCOUNTER — Telehealth (HOSPITAL_COMMUNITY): Payer: Self-pay | Admitting: *Deleted

## 2016-07-25 NOTE — Telephone Encounter (Signed)
Left message on voicemail per DPR in reference to upcoming appointment scheduled on 07/30/16 with detailed instructions given per Myocardial Perfusion Study Information Sheet for the test. LM to arrive 15 minutes early, and that it is imperative to arrive on time for appointment to keep from having the test rescheduled. If you need to cancel or reschedule your appointment, please call the office within 24 hours of your appointment. Failure to do so may result in a cancellation of your appointment, and a $50 no show fee. Phone number given for call back for any questions.  Kirstie Peri

## 2016-07-26 ENCOUNTER — Encounter: Payer: Self-pay | Admitting: Physician Assistant

## 2016-07-30 ENCOUNTER — Ambulatory Visit (HOSPITAL_COMMUNITY): Payer: Federal, State, Local not specified - PPO | Attending: Cardiology

## 2016-07-30 DIAGNOSIS — R9439 Abnormal result of other cardiovascular function study: Secondary | ICD-10-CM | POA: Diagnosis not present

## 2016-07-30 DIAGNOSIS — Z01818 Encounter for other preprocedural examination: Secondary | ICD-10-CM | POA: Diagnosis not present

## 2016-07-30 LAB — MYOCARDIAL PERFUSION IMAGING
CHL CUP NUCLEAR SSS: 20
CSEPPHR: 75 {beats}/min
LV sys vol: 131 mL
LVDIAVOL: 190 mL (ref 62–150)
RATE: 0.37
Rest HR: 77 {beats}/min
SDS: 0
SRS: 20
TID: 1.09

## 2016-07-30 MED ORDER — TECHNETIUM TC 99M TETROFOSMIN IV KIT
33.0000 | PACK | Freq: Once | INTRAVENOUS | Status: AC | PRN
  Administered 2016-07-30: 33 via INTRAVENOUS
  Filled 2016-07-30: qty 33

## 2016-07-30 MED ORDER — REGADENOSON 0.4 MG/5ML IV SOLN
0.4000 mg | Freq: Once | INTRAVENOUS | Status: AC
  Administered 2016-07-30: 0.4 mg via INTRAVENOUS

## 2016-07-30 MED ORDER — TECHNETIUM TC 99M TETROFOSMIN IV KIT
9.3000 | PACK | Freq: Once | INTRAVENOUS | Status: AC | PRN
  Administered 2016-07-30: 9.3 via INTRAVENOUS
  Filled 2016-07-30: qty 10

## 2016-08-05 ENCOUNTER — Encounter: Payer: Self-pay | Admitting: Family Medicine

## 2016-08-05 ENCOUNTER — Ambulatory Visit (INDEPENDENT_AMBULATORY_CARE_PROVIDER_SITE_OTHER): Payer: Federal, State, Local not specified - PPO | Admitting: Family Medicine

## 2016-08-05 VITALS — BP 106/69 | HR 102 | Temp 98.2°F | Resp 18 | Ht 65.75 in | Wt 147.4 lb

## 2016-08-05 DIAGNOSIS — R634 Abnormal weight loss: Secondary | ICD-10-CM

## 2016-08-05 DIAGNOSIS — Z720 Tobacco use: Secondary | ICD-10-CM

## 2016-08-05 DIAGNOSIS — I951 Orthostatic hypotension: Secondary | ICD-10-CM | POA: Diagnosis not present

## 2016-08-05 DIAGNOSIS — D72825 Bandemia: Secondary | ICD-10-CM | POA: Diagnosis not present

## 2016-08-05 DIAGNOSIS — D72829 Elevated white blood cell count, unspecified: Secondary | ICD-10-CM | POA: Diagnosis not present

## 2016-08-05 DIAGNOSIS — R000 Tachycardia, unspecified: Secondary | ICD-10-CM

## 2016-08-05 LAB — POCT CBC
Granulocyte percent: 79.9 %G (ref 37–80)
HCT, POC: 52.2 % (ref 43.5–53.7)
HEMOGLOBIN: 18 g/dL (ref 14.1–18.1)
LYMPH, POC: 2.6 (ref 0.6–3.4)
MCH, POC: 31 pg (ref 27–31.2)
MCHC: 34.4 g/dL (ref 31.8–35.4)
MCV: 90 fL (ref 80–97)
MID (cbc): 1.1 — AB (ref 0–0.9)
MPV: 8.2 fL (ref 0–99.8)
PLATELET COUNT, POC: 309 10*3/uL (ref 142–424)
POC GRANULOCYTE: 14.6 — AB (ref 2–6.9)
POC LYMPH PERCENT: 14 %L (ref 10–50)
POC MID %: 6.1 %M (ref 0–12)
RBC: 5.81 M/uL (ref 4.69–6.13)
RDW, POC: 13.9 %
WBC: 18.3 10*3/uL — AB (ref 4.6–10.2)

## 2016-08-05 NOTE — Patient Instructions (Signed)
     IF you received an x-ray today, you will receive an invoice from [facility]. Please contact [facility] at [phone] with questions or concerns regarding your invoice.   IF you received labwork today, you will receive an invoice from [facility]. Please contact [facility] at [phone] with questions or concerns regarding your invoice.   Our billing staff will not be able to assist you with questions regarding bills from these companies.  You will be contacted with the lab results as soon as they are available. The fastest way to get your results is to activate your My Chart account. Instructions are located on the last page of this paperwork. If you have not heard from us regarding the results in 2 weeks, please contact this office.     

## 2016-08-05 NOTE — Progress Notes (Signed)
Subjective:    Patient ID: Hector Byrd, male    DOB: 11-Jan-1956, 61 y.o.   MRN: [id number] Chief Complaint  Patient presents with  . Other    Leukocytosis   . Follow-up    HPI  Hector Byrd is a delightful 61 yo male here to follow-up on a Leukocytosis that was discovered on a workup prior to patient's shoulder surgery which is scheduled for 8/9 w/ Dr. Tamera Punt.    He had a nuclear Lexiscan done 1 week prior which did show a prior inferior infarct with no ischemia and EF 31% which was unchanged from the stress test in 2014. He was high risk for surgery but not prohibited.  Tobacco abuse:  History of unintentional weight loss which he attributed to the stress of going through a separation.  Had 2 syncopal episodes around April and May but none recently. Very fatigued. Sleeping 11-12 hours but still exhausted. a1c was 5.5 2 weeks ago.   Past Medical History:  Diagnosis Date  . AICD (automatic cardioverter/defibrillator) present   . Anxiety   . Arthritis   . CAD (coronary artery disease)    a. s/p stent x2 and then CABG 1996.  . Cancer of kidney ([place])    Kidney   . Cardiomyopathy, ischemic    EF 30% by MUGA s/p AICD  . Chronic systolic CHF (congestive heart failure) ([place])   . Depression   . Dyslipidemia   . Dyspnea    with exertion  . Erectile dysfunction   . GERD (gastroesophageal reflux disease)   . Hypertension   . Myocardial infarction ([place])    [id number]  . Rotator cuff tear   . S/P CABG (coronary artery bypass graft)    Past Surgical History:  Procedure Laterality Date  . CARDIAC CATHETERIZATION    . Cardiac stents     prior to 2005- 3 times total  . COLONOSCOPY    . CORONARY ARTERY BYPASS GRAFT  2005  . CORONARY STENT PLACEMENT    . IMPLANTABLE CARDIOVERTER DEFIBRILLATOR IMPLANT  12-07-2012   BTK single chamber ICD implanted by Dr Lovena Le  . IMPLANTABLE CARDIOVERTER DEFIBRILLATOR IMPLANT N/A 12/07/2012   Procedure: IMPLANTABLE CARDIOVERTER DEFIBRILLATOR IMPLANT;   Surgeon: Evans Lance, MD;  Location: [facility] CATH LAB;  Service: Cardiovascular;  Laterality: N/A;  . Kidney resection     small amount of kidney  . ROTATOR CUFF REPAIR     Current Outpatient Prescriptions on File Prior to Visit  Medication Sig Dispense Refill  . aspirin EC 81 MG tablet Take 81 mg by mouth daily.     . buprenorphine-naloxone (SUBOXONE) 8-2 MG SUBL SL tablet Place 0.5-1 tablets under the tongue See admin instructions. Up to 4 times daily 1 tablet in the morning, 1/2 tablet at 10am and 5pm, 1 tablet at 8pm    . clonazePAM (KLONOPIN) 1 MG tablet Take 1-3 mg by mouth 2 (two) times daily. 1 mg by mouth in the morning & 3 mg at bedtime.    . lamoTRIgine (LAMICTAL) 100 MG tablet Take 100 mg by mouth 2 (two) times daily.     Marland Kitchen lisinopril (PRINIVIL,ZESTRIL) 5 MG tablet Take 1 tablet (5 mg total) by mouth daily. 90 tablet 3  . metoprolol succinate (TOPROL XL) 25 MG 24 hr tablet Take 0.5 tablets (12.5 mg total) by mouth daily. 45 tablet 1  . naphazoline-pheniramine (NAPHCON-A) 0.025-0.3 % ophthalmic solution Place 1 drop into both eyes 2 (two) times daily.    . naproxen sodium (  ANAPROX) 220 MG tablet Take 440 mg by mouth daily.    . nitroGLYCERIN (NITROSTAT) 0.4 MG SL tablet Place 0.4 mg under the tongue every 5 (five) minutes as needed for chest pain (MAX 3 TABLETS).     Marland Kitchen omeprazole (PRILOSEC) 20 MG capsule Take 20 mg by mouth daily as needed (heartburn).    . venlafaxine XR (EFFEXOR-XR) 150 MG 24 hr capsule Take 150 mg by mouth 2 (two) times daily.     Marland Kitchen amoxicillin-clavulanate (AUGMENTIN) 875-125 MG tablet Take 1 tablet by mouth 2 (two) times daily. (Patient not taking: Reported on 08/05/2016) 28 tablet 0   No current facility-administered medications on file prior to visit.    No Known Allergies Family History  Problem Relation Age of Onset  . Diabetes Mother   . Cancer Father    Social History   Social History  . Marital status: Single    Spouse name: N/A  . Number of  children: N/A  . Years of education: N/A   Social History Main Topics  . Smoking status: Current Every Day Smoker    Packs/day: 0.50    Years: 40.00    Types: Cigarettes  . Smokeless tobacco: Never Used     Comment: uses cigarelle- 4 per day  . Alcohol use No     Comment: HISTORY OF DRUG & ETOH 09/19/2004  . Drug use: No     Comment: none since 09/19/2004  . Sexual activity: Not Asked   Other Topics Concern  . None   Social History Narrative  . None   Depression screen [facility] 2/9 08/05/2016 07/19/2016  Decreased Interest 0 0  Down, Depressed, Hopeless 0 0  PHQ - 2 Score 0 0    Review of Systems See hpi    Objective:   Physical Exam  Constitutional: He is oriented to person, place, and time. He appears well-developed and well-nourished. No distress.  HENT:  Head: Normocephalic and atraumatic.  Eyes: Pupils are equal, round, and reactive to light. Conjunctivae are normal. No scleral icterus.  Neck: Normal range of motion. Neck supple. No thyromegaly present.  Cardiovascular: Normal rate, regular rhythm, normal heart sounds and intact distal pulses.   Pulmonary/Chest: Effort normal and breath sounds normal. No respiratory distress.  Musculoskeletal: He exhibits no edema.  Lymphadenopathy:    He has no cervical adenopathy.  Neurological: He is alert and oriented to person, place, and time.  Skin: Skin is warm and dry. He is not diaphoretic.  Psychiatric: He has a normal mood and affect. His behavior is normal.      BP 106/69   Pulse (!) 102   Temp 98.2 F (36.8 C) (Oral)   Resp 18   Ht 5' 5.75" (1.67 m)   Wt 147 lb 6.4 oz (66.9 kg)   SpO2 95%   BMI 23.97 kg/m      Assessment & Plan:   1. Bandemia   2. Orthostatic hypotension   3. Tachycardia   4. Unintentional weight loss   5. Tobacco abuse     Orders Placed This Encounter  Procedures  . CT Chest Wo Contrast    Standing Status:   Future    Standing Expiration Date:   10/06/2017    Order Specific Question:    Reason for Exam (SYMPTOM  OR DIAGNOSIS REQUIRED)    Answer:   tobacco abuse, 40+ lb unintentional weight loss, persistent leukocytosis    Order Specific Question:   Preferred imaging location?    Answer:  [facility]    Order Specific Question:   Radiology Contrast Protocol - do NOT remove file path    Answer:   [email or web address]  . Pathologist smear review  . CBC with Differential/Platelet  . Comprehensive metabolic panel  . Pathologist smear review  . Ambulatory referral to Hematology    Referral Priority:   Routine    Referral Type:   Consultation    Referral Reason:   Specialty Services Required    Requested Specialty:   Oncology    Number of Visits Requested:   1  . Orthostatic vital signs  . POCT CBC     Delman Cheadle, M.D.  Primary Care at [facility] [address] [place], [place] [address] [phone] phone [phone] fax  08/07/16 11:00 PM

## 2016-08-06 LAB — COMPREHENSIVE METABOLIC PANEL
ALBUMIN: 4.2 g/dL (ref 3.6–4.8)
ALT: 16 IU/L (ref 0–44)
AST: 21 IU/L (ref 0–40)
Albumin/Globulin Ratio: 1.6 (ref 1.2–2.2)
Alkaline Phosphatase: 193 IU/L — ABNORMAL HIGH (ref 39–117)
BILIRUBIN TOTAL: 0.4 mg/dL (ref 0.0–1.2)
BUN / CREAT RATIO: 13 (ref 10–24)
BUN: 11 mg/dL (ref 8–27)
CHLORIDE: 104 mmol/L (ref 96–106)
CO2: 23 mmol/L (ref 20–29)
Calcium: 9.7 mg/dL (ref 8.6–10.2)
Creatinine, Ser: 0.85 mg/dL (ref 0.76–1.27)
GFR calc non Af Amer: 93 mL/min/{1.73_m2} (ref >59)
GFR, EST AFRICAN AMERICAN: 107 mL/min/{1.73_m2} (ref >59)
GLOBULIN, TOTAL: 2.7 g/dL (ref 1.5–4.5)
GLUCOSE: 96 mg/dL (ref 65–99)
Potassium: 4.9 mmol/L (ref 3.5–5.2)
SODIUM: 143 mmol/L (ref 134–144)
TOTAL PROTEIN: 6.9 g/dL (ref 6.0–8.5)

## 2016-08-06 LAB — CUP PACEART REMOTE DEVICE CHECK
Date Time Interrogation Session: 20180724080853
Implantable Lead Implant Date: 20141124
Implantable Lead Model: 365501
Implantable Lead Serial Number: 10552343
Lead Channel Setting Sensing Sensitivity: 0.8 mV
MDC IDC LEAD LOCATION: 753860
MDC IDC MSMT BATTERY REMAINING PERCENTAGE: 79 %
MDC IDC PG IMPLANT DT: 20141124
MDC IDC SET LEADCHNL RV PACING PULSEWIDTH: 0.4 ms
Pulse Gen Model: 383594
Pulse Gen Serial Number: 60740583

## 2016-08-08 LAB — PATHOLOGIST SMEAR REVIEW
BASOS: 0 %
Basophils Absolute: 0 10*3/uL (ref 0.0–0.2)
EOS (ABSOLUTE): 0.4 10*3/uL (ref 0.0–0.4)
EOS: 2 %
HEMOGLOBIN: 17.7 g/dL (ref 13.0–17.7)
Hematocrit: 50.8 % (ref 37.5–51.0)
Immature Grans (Abs): 0 10*3/uL (ref 0.0–0.1)
Immature Granulocytes: 0 %
LYMPHS ABS: 2.2 10*3/uL (ref 0.7–3.1)
Lymphs: 12 %
MCH: 32.1 pg (ref 26.6–33.0)
MCHC: 34.8 g/dL (ref 31.5–35.7)
MCV: 92 fL (ref 79–97)
Monocytes Absolute: 1.7 10*3/uL — ABNORMAL HIGH (ref 0.1–0.9)
Monocytes: 9 %
NEUTROS ABS: 13.7 10*3/uL — AB (ref 1.4–7.0)
Neutrophils: 77 %
PATH REV PLTS: NORMAL
PATH REV RBC: NORMAL
Platelets: 292 10*3/uL (ref 150–379)
RBC: 5.52 x10E6/uL (ref 4.14–5.80)
RDW: 13.9 % (ref 12.3–15.4)
WBC: 18 10*3/uL — ABNORMAL HIGH (ref 3.4–10.8)

## 2016-08-09 ENCOUNTER — Encounter: Payer: Self-pay | Admitting: Family Medicine

## 2016-08-13 ENCOUNTER — Encounter: Payer: Self-pay | Admitting: Family Medicine

## 2016-08-13 ENCOUNTER — Telehealth: Payer: Self-pay | Admitting: Family Medicine

## 2016-08-13 DIAGNOSIS — D72825 Bandemia: Secondary | ICD-10-CM

## 2016-08-13 DIAGNOSIS — Z720 Tobacco use: Secondary | ICD-10-CM

## 2016-08-13 DIAGNOSIS — R634 Abnormal weight loss: Secondary | ICD-10-CM

## 2016-08-13 NOTE — Telephone Encounter (Signed)
Wells Guiles from [facility] called wanting to know if we could update CT order from CT chest w/o contrast to CT chest w/ contrast. She said if we are looking for malignancy or infection a CT chest w/ contrast would be better but if not they could still do the CT Chest w/o. Please advise. Wells Guiles would like to be contacted once decision of order is made at [phone]. Thanks!

## 2016-08-13 NOTE — Telephone Encounter (Signed)
[facility] imaging advised. Thank you!

## 2016-08-13 NOTE — Telephone Encounter (Signed)
Thanks for the call. New order placed w/ contrast.

## 2016-08-15 ENCOUNTER — Encounter: Payer: Self-pay | Admitting: Family Medicine

## 2016-08-15 ENCOUNTER — Ambulatory Visit
Admission: RE | Admit: 2016-08-15 | Discharge: 2016-08-15 | Disposition: A | Discharge status: Home / Self Care | Payer: Federal, State, Local not specified - PPO | Source: Ambulatory Visit | Attending: Family Medicine | Admitting: Family Medicine

## 2016-08-15 DIAGNOSIS — D72825 Bandemia: Secondary | ICD-10-CM

## 2016-08-15 DIAGNOSIS — J439 Emphysema, unspecified: Secondary | ICD-10-CM | POA: Diagnosis not present

## 2016-08-15 DIAGNOSIS — R634 Abnormal weight loss: Secondary | ICD-10-CM

## 2016-08-15 DIAGNOSIS — Z720 Tobacco use: Secondary | ICD-10-CM

## 2016-08-15 MED ORDER — IOPAMIDOL (ISOVUE-300) INJECTION 61%
75.0000 mL | Freq: Once | INTRAVENOUS | Status: AC | PRN
  Administered 2016-08-15: 75 mL via INTRAVENOUS

## 2016-08-16 ENCOUNTER — Other Ambulatory Visit: Payer: Self-pay | Admitting: Family Medicine

## 2016-08-16 ENCOUNTER — Encounter: Payer: Self-pay | Admitting: Family Medicine

## 2016-08-16 DIAGNOSIS — I712 Thoracic aortic aneurysm, without rupture, unspecified: Secondary | ICD-10-CM | POA: Insufficient documentation

## 2016-08-16 DIAGNOSIS — J439 Emphysema, unspecified: Secondary | ICD-10-CM | POA: Insufficient documentation

## 2016-08-16 MED ORDER — LEVOFLOXACIN 750 MG PO TABS: 750.0000 mg | ORAL_TABLET | Freq: Every day | ORAL | 0 refills | Status: DC

## 2016-08-18 ENCOUNTER — Encounter: Payer: Self-pay | Admitting: Family Medicine

## 2016-08-18 DIAGNOSIS — K029 Dental caries, unspecified: Secondary | ICD-10-CM

## 2016-08-18 DIAGNOSIS — D72829 Elevated white blood cell count, unspecified: Secondary | ICD-10-CM

## 2016-08-18 DIAGNOSIS — Z5982 Transportation insecurity: Secondary | ICD-10-CM

## 2016-08-18 DIAGNOSIS — K047 Periapical abscess without sinus: Secondary | ICD-10-CM

## 2016-08-18 DIAGNOSIS — Z9189 Other specified personal risk factors, not elsewhere classified: Secondary | ICD-10-CM

## 2016-08-22 ENCOUNTER — Encounter (HOSPITAL_COMMUNITY): Admission: RE | Payer: Self-pay | Source: Ambulatory Visit

## 2016-08-22 ENCOUNTER — Inpatient Hospital Stay (HOSPITAL_COMMUNITY)
Admission: RE | Admit: 2016-08-22 | Payer: Federal, State, Local not specified - PPO | Source: Ambulatory Visit | Admitting: Orthopedic Surgery

## 2016-08-22 ENCOUNTER — Encounter: Payer: Self-pay | Admitting: Family Medicine

## 2016-08-22 SURGERY — ARTHROPLASTY, SHOULDER, TOTAL, REVERSE
Anesthesia: Choice | Laterality: Left

## 2016-08-26 ENCOUNTER — Encounter: Payer: Self-pay | Admitting: Family Medicine

## 2016-08-30 DIAGNOSIS — F1210 Cannabis abuse, uncomplicated: Secondary | ICD-10-CM | POA: Diagnosis not present

## 2016-08-30 DIAGNOSIS — F410 Panic disorder [episodic paroxysmal anxiety] without agoraphobia: Secondary | ICD-10-CM | POA: Diagnosis not present

## 2016-08-30 DIAGNOSIS — F3342 Major depressive disorder, recurrent, in full remission: Secondary | ICD-10-CM | POA: Diagnosis not present

## 2016-09-05 ENCOUNTER — Encounter: Payer: Self-pay | Admitting: Family Medicine

## 2016-09-08 ENCOUNTER — Encounter: Payer: Self-pay | Admitting: Family Medicine

## 2016-09-09 ENCOUNTER — Encounter: Payer: Self-pay | Admitting: Physician Assistant

## 2016-09-10 ENCOUNTER — Encounter: Payer: Self-pay | Admitting: Family Medicine

## 2016-09-13 ENCOUNTER — Encounter: Payer: Self-pay | Admitting: Physician Assistant

## 2016-09-18 ENCOUNTER — Encounter: Payer: Self-pay | Admitting: Family Medicine

## 2016-09-18 ENCOUNTER — Telehealth: Payer: Self-pay | Admitting: *Deleted

## 2016-09-18 ENCOUNTER — Encounter: Payer: Self-pay | Admitting: *Deleted

## 2016-09-18 NOTE — Telephone Encounter (Signed)
Home monitoring alert received today- shock impedance out of range. Needs in-office follow up. No answer, VM full.

## 2016-09-19 ENCOUNTER — Telehealth: Payer: Self-pay | Admitting: *Deleted

## 2016-09-19 ENCOUNTER — Telehealth: Payer: Self-pay | Admitting: Family Medicine

## 2016-09-19 NOTE — Telephone Encounter (Signed)
LMOM to return call to device clinic as soon as possible.  ICD alert- needs office visit.

## 2016-09-19 NOTE — Telephone Encounter (Signed)
Late entry.  Mr. Caudill returning call. I explained ICD alert and need for appointment to have ICD checked in office. He is concerned about transportation but agrees to a 10am appt with [facility] tomorrow 09/20/16. He will call if he has complications with getting here.  [employer] arranged to assist with interrogation.

## 2016-09-19 NOTE — Telephone Encounter (Signed)
PT VERY VERY UPSET WITH DR Brigitte Pulse HE HAS BEEN SENDING SEVERAL MESSAGES THROUGH [facility] [place] HE STATES THAT SHE PUT HIM ON TWO ANTIBIOTICS BECAUSE SHE THOUGHT THAT HE HAS CANCER AND SHE HASN'T RESPONDED TO ANY OF HIS MESSAGE HE STATES THAT HE HAS BEEN DEPRESSED SINCE SHE TOLD HIM THAT HE COULD HAVE CANCER PLEASE RESPOND TO PATIENT

## 2016-09-20 ENCOUNTER — Encounter: Payer: Self-pay | Admitting: Physician Assistant

## 2016-09-20 ENCOUNTER — Telehealth: Payer: Self-pay | Admitting: *Deleted

## 2016-09-20 NOTE — Telephone Encounter (Signed)
LMOVM advising that patient needs to reschedule his device clinic appointment that was scheduled for today at 2pm if he is unable to come today.  Shock impedance >150ohms x3 days per Home Monitoring alerts.

## 2016-09-23 ENCOUNTER — Telehealth: Payer: Self-pay | Admitting: Family Medicine

## 2016-09-23 ENCOUNTER — Encounter: Payer: Self-pay | Admitting: Physician Assistant

## 2016-09-23 ENCOUNTER — Telehealth: Payer: Self-pay

## 2016-09-23 ENCOUNTER — Encounter: Payer: Self-pay | Admitting: Internal Medicine

## 2016-09-23 NOTE — Telephone Encounter (Signed)
Pt says he has sent numerous mychart messages and left calls to hear back from Dr. Brigitte Pulse.  Can someone please help him out?  I don't know if he should be calling her or an oncologist. See previous messages, he is not very happy with [place]. [phone]

## 2016-09-23 NOTE — Telephone Encounter (Signed)
Addressed in previous phone message.

## 2016-09-23 NOTE — Telephone Encounter (Signed)
I put him on antibiotics to treat PNEUMONIA. Because I think he has PNEUMONIA - not cancer. As previously requested to pt, please follow-up in the office with a visit ASAP. This is really something that I need to address with him directly with dedicated time and needs a physical exam as well as repeat blood tests - not a quick questions that can be answered by a nurse within several minutes, which is what e-mail communication is best for.  We cannot do visits or address complex medical issues over email on a routine basis- sorry for the misunderstanding and if I mislead him by my prior occasional MyChart responses.

## 2016-09-23 NOTE — Telephone Encounter (Signed)
Please advise 

## 2016-09-23 NOTE — Telephone Encounter (Signed)
LMOM to return call to [facility] to arrange appt to check ICD.

## 2016-09-23 NOTE — Telephone Encounter (Signed)
Spoke with patient and advised him to schedule an appt per Dr. Brigitte Pulse. Pt stated that he did not have time this week and that he had issues with transportation. Pt then stated that he would schedule an appointment later to see her next week.

## 2016-09-24 ENCOUNTER — Telehealth: Payer: Self-pay | Admitting: *Deleted

## 2016-09-24 NOTE — Telephone Encounter (Signed)
LMOVM requesting call back to reschedule [facility] appointment as soon as possible.  Gave Device phone number.

## 2016-09-24 NOTE — Telephone Encounter (Signed)
Called pt re: several email messages thru [place] he has sent. Left him a message to call back.

## 2016-09-25 ENCOUNTER — Telehealth: Payer: Self-pay

## 2016-09-25 NOTE — Telephone Encounter (Signed)
LVM for pt to call device clinic back regarding alert received from home monitor.

## 2016-09-25 NOTE — Telephone Encounter (Signed)
Spoke with pt regarding alert from home monitor pt stated that he had transportation issues and it would be next week before he could get a ride. Pt stated that he has tried to take a bus before but couldn't figure out the bus route schedule and that he had already used up all his money for [employer] rides. Pt stated that he may be able to get someone to bring him to an appointment, informed pt that I would send this over to scheduling for someone to call and make him an appointment with Dr. Lovena Le. Informed pt that I would send him an application for [employer] for him to fill out in order to get help with transportation. Pt voiced understanding.

## 2016-09-26 ENCOUNTER — Telehealth: Payer: Self-pay

## 2016-09-26 NOTE — Telephone Encounter (Signed)
See phone note from Theodoro Doing, RN on 09/25/16.  Encounter closed.

## 2016-09-26 NOTE — Telephone Encounter (Signed)
Called Hector Byrd to discuss need for community resources. Hector Byrd voiced that transportation was his most urgent need. Patient agreed to let me work on his behalf to see if any community resources were available. Located several community resources that can assist with transportation as well as some discount codes for some immediate transportation through [place]. Will also assist with [place] and any bus transportation help he may have. Will mail dental resources and food resources, as patient expressed need for those as well.   Left message again today and will wait for Hector Byrd to call back to give him additional information about these resources. Will also try again tomorrow to contact him.    Josepha Pigg, B.A.  Primary Care at [place]

## 2016-09-30 ENCOUNTER — Emergency Department (HOSPITAL_COMMUNITY): Payer: Federal, State, Local not specified - PPO

## 2016-09-30 ENCOUNTER — Other Ambulatory Visit: Payer: Self-pay

## 2016-09-30 ENCOUNTER — Encounter (HOSPITAL_COMMUNITY): Payer: Self-pay | Admitting: Emergency Medicine

## 2016-09-30 ENCOUNTER — Observation Stay (HOSPITAL_COMMUNITY)
Admission: EM | Admit: 2016-09-30 | Discharge: 2016-10-01 | Disposition: A | Discharge status: Home / Self Care | Payer: Federal, State, Local not specified - PPO | Attending: Internal Medicine | Admitting: Internal Medicine

## 2016-09-30 DIAGNOSIS — T82198A Other mechanical complication of other cardiac electronic device, initial encounter: Secondary | ICD-10-CM | POA: Diagnosis not present

## 2016-09-30 DIAGNOSIS — I110 Hypertensive heart disease with heart failure: Secondary | ICD-10-CM | POA: Diagnosis not present

## 2016-09-30 DIAGNOSIS — I5022 Chronic systolic (congestive) heart failure: Secondary | ICD-10-CM | POA: Diagnosis not present

## 2016-09-30 DIAGNOSIS — T82599A Other mechanical complication of unspecified cardiac and vascular devices and implants, initial encounter: Secondary | ICD-10-CM | POA: Diagnosis not present

## 2016-09-30 DIAGNOSIS — I255 Ischemic cardiomyopathy: Secondary | ICD-10-CM | POA: Insufficient documentation

## 2016-09-30 DIAGNOSIS — F419 Anxiety disorder, unspecified: Secondary | ICD-10-CM | POA: Diagnosis not present

## 2016-09-30 DIAGNOSIS — N529 Male erectile dysfunction, unspecified: Secondary | ICD-10-CM | POA: Diagnosis not present

## 2016-09-30 DIAGNOSIS — F329 Major depressive disorder, single episode, unspecified: Secondary | ICD-10-CM | POA: Insufficient documentation

## 2016-09-30 DIAGNOSIS — I252 Old myocardial infarction: Secondary | ICD-10-CM | POA: Insufficient documentation

## 2016-09-30 DIAGNOSIS — Z7982 Long term (current) use of aspirin: Secondary | ICD-10-CM | POA: Insufficient documentation

## 2016-09-30 DIAGNOSIS — M1990 Unspecified osteoarthritis, unspecified site: Secondary | ICD-10-CM | POA: Insufficient documentation

## 2016-09-30 DIAGNOSIS — K219 Gastro-esophageal reflux disease without esophagitis: Secondary | ICD-10-CM | POA: Insufficient documentation

## 2016-09-30 DIAGNOSIS — E785 Hyperlipidemia, unspecified: Secondary | ICD-10-CM | POA: Diagnosis not present

## 2016-09-30 DIAGNOSIS — Z955 Presence of coronary angioplasty implant and graft: Secondary | ICD-10-CM | POA: Insufficient documentation

## 2016-09-30 DIAGNOSIS — F17210 Nicotine dependence, cigarettes, uncomplicated: Secondary | ICD-10-CM | POA: Diagnosis not present

## 2016-09-30 DIAGNOSIS — T82118A Breakdown (mechanical) of other cardiac electronic device, initial encounter: Secondary | ICD-10-CM | POA: Diagnosis present

## 2016-09-30 DIAGNOSIS — Z951 Presence of aortocoronary bypass graft: Secondary | ICD-10-CM | POA: Diagnosis not present

## 2016-09-30 DIAGNOSIS — I472 Ventricular tachycardia: Principal | ICD-10-CM | POA: Insufficient documentation

## 2016-09-30 DIAGNOSIS — R079 Chest pain, unspecified: Secondary | ICD-10-CM | POA: Diagnosis not present

## 2016-09-30 DIAGNOSIS — Z4502 Encounter for adjustment and management of automatic implantable cardiac defibrillator: Secondary | ICD-10-CM

## 2016-09-30 DIAGNOSIS — Z95810 Presence of automatic (implantable) cardiac defibrillator: Secondary | ICD-10-CM | POA: Insufficient documentation

## 2016-09-30 DIAGNOSIS — T849XXA Unspecified complication of internal orthopedic prosthetic device, implant and graft, initial encounter: Secondary | ICD-10-CM | POA: Diagnosis not present

## 2016-09-30 DIAGNOSIS — I2510 Atherosclerotic heart disease of native coronary artery without angina pectoris: Secondary | ICD-10-CM | POA: Diagnosis not present

## 2016-09-30 NOTE — ED Triage Notes (Addendum)
Per EMS: Pt has had his defibrillator for a few years with no trouble.  Pt was walking at a brisk pace back from the dollar store today and his defib went off.  Had no symptoms prior to it going off.  No complaints right now.  His defib was checked a few days ago and "something showed up" but they are thinking its more a lead that is off.

## 2016-09-30 NOTE — ED Provider Notes (Signed)
[facility] DEPT Provider Note   CSN: [id number] Arrival date & time: 09/30/16  2136     History   Chief Complaint Chief Complaint  Patient presents with  . Defib fired    HPI Hector Byrd is a 61 y.o. male with a h/o of an AICD who presents to the emergency department with a chief complaint of defibrillator firing that occurred tonight at approximately 8:30 PM. The patient reports that he was walking back from the [employer] store carrying a couple bags of dog food when he got approximately 30 feet from his house and felt his defibrillator fire. No h/o of similar. He denies chest pain, dyspnea, palpitations, diaphoresis, numbness, or weakness during the episode. He reports that normally he will stop and rest several times on the way home from the store, but was more concerned with getting homing home so he "pushed through".   He reports that he is followed by Dr. Lovena Le with cardiology and has received several calls from their office over the last week and a half to come into the clinic to further investigate alerts from his ICD home monitoring program. He states that he has had to reschedule his appointment several times due to not having transportation and has not yet followed up. He reports that he is trying to reschedule his appointment for tomorrow or in the coming week.  The history is provided by the patient. No language interpreter was used.    Past Medical History:  Diagnosis Date  . AICD (automatic cardioverter/defibrillator) present   . Anxiety   . Arthritis   . CAD (coronary artery disease)    a. s/p stent x2 and then CABG 1996.  . Cancer of kidney ([place])    Kidney   . Cardiomyopathy, ischemic    EF 30% by MUGA s/p AICD  . Chronic systolic CHF (congestive heart failure) ([place])   . Depression   . Dyslipidemia   . Dyspnea    with exertion  . Erectile dysfunction   . GERD (gastroesophageal reflux disease)   . Hypertension   . Myocardial infarction ([place])    [id number]  .  Rotator cuff tear   . S/P CABG (coronary artery bypass graft)     Patient Active Problem List   Diagnosis Date Noted  . Emphysema lung ([place]) 08/16/2016  . Thoracic aortic aneurysm ([place]) 08/16/2016  . ICD (implantable cardioverter-defibrillator) in place 04/20/2014  . SOB (shortness of breath) 10/19/2013  . HTN (hypertension) 02/03/2013  . Cardiomyopathy, ischemic   . Erectile dysfunction   . CAD (coronary artery disease)   . Rotator cuff tear   . Depression   . Dyslipidemia   . Cancer of kidney [facility])     Past Surgical History:  Procedure Laterality Date  . CARDIAC CATHETERIZATION    . Cardiac stents     prior to 2005- 3 times total  . COLONOSCOPY    . CORONARY ARTERY BYPASS GRAFT  2005  . CORONARY STENT PLACEMENT    . IMPLANTABLE CARDIOVERTER DEFIBRILLATOR IMPLANT  12-07-2012   BTK single chamber ICD implanted by Dr Lovena Le  . IMPLANTABLE CARDIOVERTER DEFIBRILLATOR IMPLANT N/A 12/07/2012   Procedure: IMPLANTABLE CARDIOVERTER DEFIBRILLATOR IMPLANT;  Surgeon: Evans Lance, MD;  Location: [facility] CATH LAB;  Service: Cardiovascular;  Laterality: N/A;  . Kidney resection     small amount of kidney  . ROTATOR CUFF REPAIR         Home Medications    Prior to Admission medications   Medication Sig  Start Date End Date Taking? Authorizing Provider  aspirin EC 81 MG tablet Take 81 mg by mouth daily.    Yes [provider]  buprenorphine-naloxone (SUBOXONE) 8-2 MG SUBL SL tablet Place 0.5-1 tablets under the tongue See admin instructions. Up to 4 times daily 1 tablet in the morning, 1/2 tablet at 10am and 5pm, 1 tablet at 8pm   Yes [provider]  clonazePAM (KLONOPIN) 1 MG tablet Take 1-3 mg by mouth 2 (two) times daily. 1 mg by mouth in the morning & 3 mg at bedtime. 09/23/12  Yes [provider]  lamoTRIgine (LAMICTAL) 100 MG tablet Take 100 mg by mouth 2 (two) times daily.  09/14/12  Yes [provider]  lisinopril (PRINIVIL,ZESTRIL) 5 MG tablet  Take 1 tablet (5 mg total) by mouth daily. 07/02/16 10/01/16 Yes Dunn, Dayna N, PA-C  metoprolol succinate (TOPROL XL) 25 MG 24 hr tablet Take 0.5 tablets (12.5 mg total) by mouth daily. 07/02/16  Yes Dunn, Dayna N, PA-C  naphazoline-pheniramine (NAPHCON-A) 0.025-0.3 % ophthalmic solution Place 1 drop into both eyes 2 (two) times daily.   Yes [provider]  naproxen sodium (ANAPROX) 220 MG tablet Take 220 mg by mouth 2 (two) times daily as needed (pain).    Yes [provider]  nitroGLYCERIN (NITROSTAT) 0.4 MG SL tablet Place 0.4 mg under the tongue every 5 (five) minutes as needed for chest pain (MAX 3 TABLETS).    Yes [provider]  omeprazole (PRILOSEC) 20 MG capsule Take 20 mg by mouth daily as needed (heartburn).   Yes [provider]  venlafaxine XR (EFFEXOR-XR) 150 MG 24 hr capsule Take 150 mg by mouth 2 (two) times daily.  09/26/12  Yes [provider]  levofloxacin (LEVAQUIN) 750 MG tablet Take 1 tablet (750 mg total) by mouth daily. Patient not taking: Reported on 10/01/2016 08/16/16   Shawnee Knapp, MD    Family History Family History  Problem Relation Age of Onset  . Diabetes Mother   . Cancer Father     Social History Social History  Substance Use Topics  . Smoking status: Current Every Day Smoker    Packs/day: 0.50    Years: 40.00    Types: Cigarettes  . Smokeless tobacco: Never Used     Comment: uses cigarelle- 4 per day  . Alcohol use No     Comment: HISTORY OF DRUG & ETOH 09/19/2004     Allergies   Patient has no known allergies.   Review of Systems Review of Systems  Constitutional: Negative for activity change.  Respiratory: Negative for shortness of breath.   Cardiovascular: Negative for chest pain and palpitations.       Defibrillator fired   Gastrointestinal: Negative for abdominal pain.  Musculoskeletal: Negative for back pain.  Skin: Negative for rash.  Neurological: Negative for weakness and numbness.      Physical Exam Updated Vital Signs BP 118/75 (BP Location: Left Arm)   Pulse 73   Temp 98.3 F (36.8 C) (Oral)   Resp 18   SpO2 100%   Physical Exam  Constitutional: He appears well-developed and well-nourished. No distress.  HENT:  Head: Normocephalic.  Eyes: Conjunctivae are normal.  Neck: Neck supple.  Cardiovascular: Normal rate, regular rhythm, normal heart sounds and intact distal pulses.  Exam reveals no gallop and no friction rub.   No murmur heard. Pulmonary/Chest: Effort normal and breath sounds normal. No respiratory distress. He has no wheezes. He has no rales.  Abdominal: Soft. Bowel sounds are normal. He exhibits no distension and no mass. There is no tenderness. There is no rebound and no guarding.  Musculoskeletal:  Sling in place over the left shoulder.  Neurological: He is alert.  Skin: Skin is warm and dry. He is not diaphoretic.  Psychiatric: His behavior is normal.  Nursing note and vitals reviewed.  ED Treatments / Results  Labs (all labs ordered are listed, but only abnormal results are displayed) Labs Reviewed  BASIC METABOLIC PANEL - Abnormal; Notable for the following:       Result Value   Potassium 5.4 (*)    Glucose, Bld 55 (*)    All other components within normal limits  MAGNESIUM  I-STAT CHEM 8, ED    EKG  EKG Interpretation  Date/Time:  Monday September 30 2016 21:49:28 EDT Ventricular Rate:  90 PR Interval:  158 QRS Duration: 112 QT Interval:  362 QTC Calculation: 442 R Axis:   5 Text Interpretation:  Normal sinus rhythm Possible Left atrial enlargement Inferior infarct , age undetermined Anterior infarct , age undetermined Abnormal ECG Confirmed by Ripley Fraise ([id number]) on 09/30/2016 11:22:19 PM       Radiology Dg Chest 2 View  Result Date: 09/30/2016 CLINICAL DATA:  Defibrillator fired tonight. Recent fracture of the left humerus. EXAM: CHEST  2 VIEW COMPARISON:  07/19/2016 FINDINGS: Postoperative changes in the  mediastinum. Cardiac pacemaker. Normal heart size and pulmonary vascularity. No focal airspace disease or consolidation in the lungs. No blunting of costophrenic angles. No pneumothorax. Mediastinal contours appear intact. 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 Degenerative changes in both shoulders. Thoracolumbar scoliosis with degenerative change. IMPRESSION: No active cardiopulmonary disease. Electronically Signed   By: Lucienne Capers M.D.   On: 09/30/2016 23:57    Procedures Procedures (including critical care time)  Medications Ordered in ED Medications - No data to display   Initial Impression / Assessment and Plan / ED Course  I have reviewed the triage vital signs and the nursing notes.  Pertinent labs & imaging results that were available during my care of the patient were reviewed by me and considered in my medical decision making (see chart for details).     61 year old male presenting to the emergency department with a chief complaint of defibrillator firing. The patient was seen and evaluated with Dr. Christy Gentles, attending physician. Chest x-ray unremarkable. BMP with potassium of 5.4 and glucose of 55, but the specimen hemolyzed. Will reorder a Chem 8, which is pending. Defibrillator investigation with a 10 second run of ventricular tachycardia around 8:45 PM. The technician also states that his defibrillator has been over sensing from 9/6-9/9, 9/13, 9/15, and 9/17. He reports the V-lead has been out of range and there is nonphysiologic noise on the RV channel. Cardiology consult pending at this time. Patient care transferred to Dr. Christy Gentles at the end of my shift. Patient presentation, ED course, and plan of care discussed with review of all pertinent labs and imaging. Please see his/her note for further details regarding further ED course and disposition.  Final Clinical Impressions(s) / ED Diagnoses   Final diagnoses:  None    New  Prescriptions New Prescriptions   No medications on file     Joanne Gavel, PA-C 10/01/16 0138    Ripley Fraise, MD 10/01/16 [phone]

## 2016-10-01 ENCOUNTER — Encounter (HOSPITAL_COMMUNITY): Payer: Self-pay | Admitting: *Deleted

## 2016-10-01 DIAGNOSIS — T82198A Other mechanical complication of other cardiac electronic device, initial encounter: Secondary | ICD-10-CM

## 2016-10-01 DIAGNOSIS — I255 Ischemic cardiomyopathy: Secondary | ICD-10-CM

## 2016-10-01 DIAGNOSIS — I472 Ventricular tachycardia, unspecified: Secondary | ICD-10-CM | POA: Insufficient documentation

## 2016-10-01 DIAGNOSIS — I2510 Atherosclerotic heart disease of native coronary artery without angina pectoris: Secondary | ICD-10-CM | POA: Diagnosis not present

## 2016-10-01 DIAGNOSIS — T82118A Breakdown (mechanical) of other cardiac electronic device, initial encounter: Secondary | ICD-10-CM | POA: Diagnosis present

## 2016-10-01 LAB — BASIC METABOLIC PANEL
ANION GAP: 8 (ref 5–15)
ANION GAP: 6 (ref 5–15)
BUN: 12 mg/dL (ref 6–20)
BUN: 14 mg/dL (ref 6–20)
CHLORIDE: 106 mmol/L (ref 101–111)
CO2: 23 mmol/L (ref 22–32)
CO2: 24 mmol/L (ref 22–32)
Calcium: 9 mg/dL (ref 8.9–10.3)
Calcium: 9.2 mg/dL (ref 8.9–10.3)
Chloride: 108 mmol/L (ref 101–111)
Creatinine, Ser: 0.76 mg/dL (ref 0.61–1.24)
Creatinine, Ser: 0.77 mg/dL (ref 0.61–1.24)
GFR calc Af Amer: >60 mL/min (ref >60)
GFR calc non Af Amer: >60 mL/min (ref >60)
GFR calc non Af Amer: >60 mL/min (ref >60)
GLUCOSE: 55 mg/dL — AB (ref 65–99)
Glucose, Bld: 73 mg/dL (ref 65–99)
POTASSIUM: 4.6 mmol/L (ref 3.5–5.1)
POTASSIUM: 5.4 mmol/L — AB (ref 3.5–5.1)
SODIUM: 137 mmol/L (ref 135–145)
Sodium: 138 mmol/L (ref 135–145)

## 2016-10-01 LAB — I-STAT CHEM 8, ED
BUN: 18 mg/dL (ref 6–20)
CREATININE: 0.6 mg/dL — AB (ref 0.61–1.24)
Calcium, Ion: 1.05 mmol/L — ABNORMAL LOW (ref 1.15–1.40)
Chloride: 106 mmol/L (ref 101–111)
GLUCOSE: 58 mg/dL — AB (ref 65–99)
HEMATOCRIT: 43 % (ref 39.0–52.0)
Hemoglobin: 14.6 g/dL (ref 13.0–17.0)
Potassium: 4.7 mmol/L (ref 3.5–5.1)
Sodium: 140 mmol/L (ref 135–145)
TCO2: 28 mmol/L (ref 22–32)

## 2016-10-01 LAB — HIV ANTIBODY (ROUTINE TESTING W REFLEX): HIV Screen 4th Generation wRfx: NONREACTIVE

## 2016-10-01 LAB — MAGNESIUM: Magnesium: 1.8 mg/dL (ref 1.7–2.4)

## 2016-10-01 LAB — CBG MONITORING, ED: Glucose-Capillary: 75 mg/dL (ref 65–99)

## 2016-10-01 MED ORDER — BUPRENORPHINE HCL-NALOXONE HCL 8-2 MG SL SUBL
1.0000 | SUBLINGUAL_TABLET | Freq: Two times a day (BID) | SUBLINGUAL | Status: DC
  Administered 2016-10-01: 1 via SUBLINGUAL
  Filled 2016-10-01: qty 1

## 2016-10-01 MED ORDER — BUPRENORPHINE HCL-NALOXONE HCL 2-0.5 MG SL SUBL: 2.0000 | SUBLINGUAL_TABLET | Freq: Two times a day (BID) | SUBLINGUAL | Status: DC

## 2016-10-01 MED ORDER — PANTOPRAZOLE SODIUM 40 MG PO TBEC: 40.0000 mg | DELAYED_RELEASE_TABLET | Freq: Every day | ORAL | Status: DC

## 2016-10-01 MED ORDER — LISINOPRIL 10 MG PO TABS
5.0000 mg | ORAL_TABLET | Freq: Every day | ORAL | Status: DC
Start: 2016-10-01 — End: 2016-10-01

## 2016-10-01 MED ORDER — ASPIRIN EC 81 MG PO TBEC: 81.0000 mg | DELAYED_RELEASE_TABLET | Freq: Every day | ORAL | Status: DC

## 2016-10-01 MED ORDER — CLONAZEPAM 0.5 MG PO TABS: 3.0000 mg | ORAL_TABLET | Freq: Every day | ORAL | Status: DC

## 2016-10-01 MED ORDER — METOPROLOL SUCCINATE 12.5 MG HALF TABLET
12.5000 mg | ORAL_TABLET | Freq: Every day | ORAL | Status: DC
  Filled 2016-10-01: qty 1

## 2016-10-01 MED ORDER — NITROGLYCERIN 0.4 MG SL SUBL: 0.4000 mg | SUBLINGUAL_TABLET | SUBLINGUAL | Status: DC | PRN

## 2016-10-01 MED ORDER — NAPHAZOLINE-PHENIRAMINE 0.025-0.3 % OP SOLN
1.0000 [drp] | Freq: Two times a day (BID) | OPHTHALMIC | Status: DC
  Filled 2016-10-01: qty 5

## 2016-10-01 MED ORDER — LAMOTRIGINE 100 MG PO TABS
100.0000 mg | ORAL_TABLET | Freq: Two times a day (BID) | ORAL | Status: DC
Start: 2016-10-01 — End: 2016-10-01
  Filled 2016-10-01: qty 1

## 2016-10-01 MED ORDER — BUPRENORPHINE HCL-NALOXONE HCL 8-2 MG SL SUBL: 0.5000 | SUBLINGUAL_TABLET | SUBLINGUAL | Status: DC

## 2016-10-01 MED ORDER — VENLAFAXINE HCL ER 150 MG PO CP24
150.0000 mg | ORAL_CAPSULE | Freq: Two times a day (BID) | ORAL | Status: DC
  Filled 2016-10-01: qty 1

## 2016-10-01 MED ORDER — CLONAZEPAM 0.5 MG PO TABS: 1.0000 mg | ORAL_TABLET | Freq: Two times a day (BID) | ORAL | Status: DC

## 2016-10-01 MED ORDER — NAPROXEN 250 MG PO TABS: 250.0000 mg | ORAL_TABLET | Freq: Two times a day (BID) | ORAL | Status: DC | PRN

## 2016-10-01 MED ORDER — CLONAZEPAM 0.5 MG PO TABS: 1.0000 mg | ORAL_TABLET | Freq: Every day | ORAL | Status: DC

## 2016-10-01 NOTE — H&P (Signed)
History and Physical   Patient ID: Hector Byrd, MRN: [id number], DOB: 04-Oct-1955   Date of Encounter: 10/01/2016, 1:47 AM  Primary Care Provider: Shawnee Knapp, MD Cardiologist: Radford Pax Electrophysiologist:  Lovena Le  Chief Complaint:  AICD fire  History of Present Illness: Hector Byrd is a 61 y.o. male with a history of ASCAD, dyslipidemia and HTN who presents today for followup.  He denies any chest pain, SOB, DOE, LE edema, palpitations, dizziness or syncope.  He also has a history of ischemic DCM with EF 30% and underwent AICD implantation on 12/07/2012. He presents tonight after his ICD fired. He says he was feeling fine and went for a walk in the heat and humidity earlier this evening. He was a little tired, but denies chest discomfort, SOB/DOE, palpitations, dizziness or any other prodromal sx. He was shocked by the device and it knocked him to the ground. He got transport to come to the ED for evaluation. He has been having some issues with alarms/alerts on his remote transmissions for the past week to 10 days. His device was interrogated by [employer] in the ED and it appears the problem is RV lead over-sensing, putting pt at risk for inappropriate shock. However it does appear that the therapy he received this evening was an appropriate shock due to VT run of 16 seconds including charge time and therapy. Rate was 265bpm.   Past Medical History:  Diagnosis Date  . AICD (automatic cardioverter/defibrillator) present   . Anxiety   . Arthritis   . CAD (coronary artery disease)    a. s/p stent x2 and then CABG 1996.  . Cancer of kidney ([place])    Kidney   . Cardiomyopathy, ischemic    EF 30% by MUGA s/p AICD  . Chronic systolic CHF (congestive heart failure) ([place])   . Depression   . Dyslipidemia   . Dyspnea    with exertion  . Erectile dysfunction   . GERD (gastroesophageal reflux disease)   . Hypertension   . Myocardial infarction ([place])    [id number]  . Rotator cuff tear   . S/P CABG  (coronary artery bypass graft)     Past Surgical History:  Procedure Laterality Date  . CARDIAC CATHETERIZATION    . Cardiac stents     prior to 2005- 3 times total  . COLONOSCOPY    . CORONARY ARTERY BYPASS GRAFT  2005  . CORONARY STENT PLACEMENT    . IMPLANTABLE CARDIOVERTER DEFIBRILLATOR IMPLANT  12-07-2012   BTK single chamber ICD implanted by Dr Lovena Le  . IMPLANTABLE CARDIOVERTER DEFIBRILLATOR IMPLANT N/A 12/07/2012   Procedure: IMPLANTABLE CARDIOVERTER DEFIBRILLATOR IMPLANT;  Surgeon: Evans Lance, MD;  Location: [facility] CATH LAB;  Service: Cardiovascular;  Laterality: N/A;  . Kidney resection     small amount of kidney  . ROTATOR CUFF REPAIR       Prior to Admission medications   Medication Sig Start Date End Date Taking? Authorizing Provider  aspirin EC 81 MG tablet Take 81 mg by mouth daily.    Yes [provider]  buprenorphine-naloxone (SUBOXONE) 8-2 MG SUBL SL tablet Place 0.5-1 tablets under the tongue See admin instructions. Up to 4 times daily 1 tablet in the morning, 1/2 tablet at 10am and 5pm, 1 tablet at 8pm   Yes [provider]  clonazePAM (KLONOPIN) 1 MG tablet Take 1-3 mg by mouth 2 (two) times daily. 1 mg by mouth in the morning & 3 mg at bedtime. 09/23/12  Yes [provider]  lamoTRIgine (LAMICTAL) 100 MG tablet Take 100 mg by mouth 2 (two) times daily.  09/14/12  Yes [provider]  lisinopril (PRINIVIL,ZESTRIL) 5 MG tablet Take 1 tablet (5 mg total) by mouth daily. 07/02/16 10/01/16 Yes Dunn, Dayna N, PA-C  metoprolol succinate (TOPROL XL) 25 MG 24 hr tablet Take 0.5 tablets (12.5 mg total) by mouth daily. 07/02/16  Yes Dunn, Dayna N, PA-C  naphazoline-pheniramine (NAPHCON-A) 0.025-0.3 % ophthalmic solution Place 1 drop into both eyes 2 (two) times daily.   Yes [provider]  naproxen sodium (ANAPROX) 220 MG tablet Take 220 mg by mouth 2 (two) times daily as needed (pain).    Yes [provider]  nitroGLYCERIN  (NITROSTAT) 0.4 MG SL tablet Place 0.4 mg under the tongue every 5 (five) minutes as needed for chest pain (MAX 3 TABLETS).    Yes [provider]  omeprazole (PRILOSEC) 20 MG capsule Take 20 mg by mouth daily as needed (heartburn).   Yes [provider]  venlafaxine XR (EFFEXOR-XR) 150 MG 24 hr capsule Take 150 mg by mouth 2 (two) times daily.  09/26/12  Yes [provider]  levofloxacin (LEVAQUIN) 750 MG tablet Take 1 tablet (750 mg total) by mouth daily. Patient not taking: Reported on 10/01/2016 08/16/16   Shawnee Knapp, MD     Allergies: No Known Allergies  Social History:  The patient  reports that he has been smoking Cigarettes.  He has a 20.00 pack-year smoking history. He has never used smokeless tobacco. He reports that he does not drink alcohol or use drugs.   Family History:  The patient's family history includes Cancer in his father; Diabetes in his mother.   ROS:  Please see the history of present illness.     All other systems reviewed and negative.   Vital Signs: Blood pressure 118/75, pulse 73, temperature 98.3 F (36.8 C), temperature source Oral, resp. rate 18, SpO2 100 %.  PHYSICAL EXAM: General:  Well nourished, well developed, in no acute distress HEENT: normal Lymph: no adenopathy Neck: no JVD Endocrine:  No thryomegaly Vascular: No carotid bruits; FA pulses 2+ bilaterally without bruits  Cardiac:  normal S1, S2; RRR; no murmur  Lungs:  clear to auscultation bilaterally, no wheezing, rhonchi or rales  Abd: soft, nontender, no hepatomegaly  Ext: no edema Musculoskeletal:  No deformities, BUE and BLE strength normal and equal Skin: warm and dry  Neuro:  CNs 2-12 intact, no focal abnormalities noted Psych:  Normal affect   EKG:  09-30-16 NSR with inferior infarct, anterior infarct  Labs:   Lab Results  Component Value Date   WBC 18.0 (H) 08/05/2016   HGB 17.7 08/05/2016   HCT 50.8 08/05/2016   MCV 92 08/05/2016   PLT 292 08/05/2016     Recent Labs Lab 09/30/16 0030  NA 138  K 5.4*  CL 108  CO2 24  BUN 14  CREATININE 0.76  CALCIUM 9.0  GLUCOSE 55*   No results for input(s): CKTOTAL, CKMB, TROPONINI in the last 72 hours. Troponin (Point of Care Test) No results for input(s): TROPIPOC in the last 72 hours.  Lab Results  Component Value Date   CHOL 108 10/21/2013   HDL 20.10 (L) 10/21/2013   LDLCALC 52 10/21/2013   TRIG 179.0 (H) 10/21/2013   No results found for: DDIMER  Radiology/Studies:  Dg Chest 2 View  Result Date: 09/30/2016 CLINICAL DATA:  Defibrillator fired tonight. Recent fracture of the left humerus. EXAM:  CHEST  2 VIEW COMPARISON:  07/19/2016 FINDINGS: Postoperative changes in the mediastinum. Cardiac pacemaker. Normal heart size and pulmonary vascularity. No focal airspace disease or consolidation in the lungs. No blunting of costophrenic angles. No pneumothorax. Mediastinal contours appear intact. 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 Degenerative changes in both shoulders. Thoracolumbar scoliosis with degenerative change. IMPRESSION: No active cardiopulmonary disease. Electronically Signed   By: Lucienne Capers M.D.   On: 09/30/2016 23:57    07-04-16 TTE Left ventricle: The cavity size was normal. Wall thickness was   increased in a pattern of mild LVH. Inferolateral akinesis.   Anterolateral hypokinesis. Basal to mid inferoseptal akinesis.   Basal to mid inferior akinesis. Systolic function was moderately   to severely reduced. The estimated ejection fraction was in the   range of 30% to 35%. Doppler parameters are consistent with   abnormal left ventricular relaxation (grade 1 diastolic   dysfunction). - Aortic valve: There was no stenosis. There was trivial   regurgitation. - Aorta: Mildly dilated aortic root. Aortic root dimension: 37 mm   (ED). - Mitral valve: Mildly calcified annulus. There was no significant   regurgitation. - Right ventricle: The  cavity size was normal. Pacer wire or   catheter noted in right ventricle. Systolic function was mildly   reduced. - Pulmonary arteries: No complete TR doppler jet so unable to   estimate PA systolic pressure. - Inferior vena cava: The vessel was normal in size. The   respirophasic diameter changes were in the normal range (>= 50%),   consistent with normal central venous pressure.  Impressions:  - Normal LV size with mild LV hypertrophy. Wall motion   abnormalities as noted above. EF 30-35%. Normal RV size with   mildly decreased systolic function. No significant valvular   abnormalities.  07-30-16 Nuc stress test  Nuclear stress EF: 31%. There is mid inferior wall aneurysmal defect.  There was no ST segment deviation noted during stress.  Defect 1: There is a large defect of severe severity present in the basal inferior, basal inferolateral, mid inferior, mid inferolateral and apical lateral location.  Findings consistent with prior myocardial infarction.  This is a high risk study based upon large inferior lateral defect with reduced ejection fraction 31%.  ASSESSMENT AND PLAN:   1. ICD fire: appropriate shock in setting of RV lead over-sensing and risk of inappropriate shocks. No device parameters have been changed at this time. Will have EP see pt in the AM. K high on initial labs but specimen hemolyzed. Pending repeat  2. CAD: no acute anginal sx. Recent nuc stress negative for ischemia. No further ischemia w/u necessary at this time  3. ICM: last EF ~30%; pt appears compensated from clinical standpoint  4. HTN/dyslipidemia: cont home regimen  Signed,  Rudean Curt, MD  10/01/2016 1:47 AM

## 2016-10-01 NOTE — ED Notes (Signed)
Pt has [employer] icd, notified tech to come in and interrogate. They have 3 hours to arrive in facility.

## 2016-10-01 NOTE — ED Notes (Signed)
Placed on hospital bed, refusing to put on hospital gown. At least got pt to take shoes off.

## 2016-10-01 NOTE — ED Provider Notes (Signed)
Patient seen/examined in the Emergency Department in conjunction with Midlevel Provider [place] Patient reports his ICD fired earlier, now resolved.  Denies CP/SOB Exam : awake/alert, resting comfortably Plan: will need labs/imaging/interrogation and cardiology consultation     Hector Fraise, MD 10/01/16 [phone]

## 2016-10-01 NOTE — Progress Notes (Signed)
Pt has a cardiac history of CAD with CABG and has an ICD. Pt denies any recent chest pain. Defibrillator fired last pm and he was seen in the ED. Pt denies being diabetic. Instructed pt not to smoke as of now until after surgery.

## 2016-10-01 NOTE — ED Provider Notes (Signed)
Cardiology called to advise patient is ready for discharge.Instructions have been put in a discharge summary by cardiology. Patient is aware of plan per cardiology and stable for discharge.   Charlesetta Shanks, MD 10/01/16 0900

## 2016-10-01 NOTE — Consult Note (Signed)
ELECTROPHYSIOLOGY CONSULT NOTE    Patient ID: Hector Byrd MRN: [id number], DOB/AGE: 02-06-1955 61 y.o.  Admit date: 09/30/2016 Date of Consult: 10/01/2016  Primary Physician: Shawnee Knapp, MD Primary Cardiologist: Radford Pax Electrophysiologist: Lovena Le  Patient Profile: Hector Byrd is a 61 y.o. male with a history of ICM, hypertension, and VT who is being seen today for the evaluation of ICD shock at the request of Dr Hassell Done.  HPI:  Hector Byrd is a 61 y.o. male who has been in his usual state of health. He has recently undergone workup for kidney mass that is still being evaluated. Last night, he received an ICD shock for which he called 911.  On arrival to the ER, his device was interrogated and he was found to have received appropriate therapy for VT with HV shock terminating rhythm.  He also has noise on his RV lead that is being sensed.  His HV lead impedance spiked in July but then has been relatively stable.  RV lead impedance is stable and sensing is stable.  He is very worried about his 2 dogs that he has at home. He does not have anyone to care for them and does not want to stay in hospital.   He denies chest pain, palpitations, dyspnea, PND, orthopnea, nausea, vomiting, dizziness, syncope, edema, weight gain, or early satiety.  Past Medical History:  Diagnosis Date  . AICD (automatic cardioverter/defibrillator) present   . Anxiety   . Arthritis   . CAD (coronary artery disease)    a. s/p stent x2 and then CABG 1996.  . Cancer of kidney ([place])    Kidney   . Cardiomyopathy, ischemic    EF 30% by MUGA s/p AICD  . Chronic systolic CHF (congestive heart failure) ([place])   . Depression   . Dyslipidemia   . Dyspnea    with exertion  . Erectile dysfunction   . GERD (gastroesophageal reflux disease)   . Hypertension   . Myocardial infarction ([place])    [id number]  . Rotator cuff tear   . S/P CABG (coronary artery bypass graft)      Surgical History:  Past Surgical History:    Procedure Laterality Date  . CARDIAC CATHETERIZATION    . Cardiac stents     prior to 2005- 3 times total  . COLONOSCOPY    . CORONARY ARTERY BYPASS GRAFT  2005  . CORONARY STENT PLACEMENT    . IMPLANTABLE CARDIOVERTER DEFIBRILLATOR IMPLANT  12-07-2012   BTK single chamber ICD implanted by Dr Lovena Le  . IMPLANTABLE CARDIOVERTER DEFIBRILLATOR IMPLANT N/A 12/07/2012   Procedure: IMPLANTABLE CARDIOVERTER DEFIBRILLATOR IMPLANT;  Surgeon: Evans Lance, MD;  Location: [facility] CATH LAB;  Service: Cardiovascular;  Laterality: N/A;  . Kidney resection     small amount of kidney  . ROTATOR CUFF REPAIR       Inpatient Medications: . aspirin EC  81 mg Oral Daily  . buprenorphine-naloxone  1 tablet Sublingual BID WC   And  . buprenorphine-naloxone  2 tablet Sublingual BID  . clonazePAM  1 mg Oral Daily   And  . clonazePAM  3 mg Oral QHS  . lamoTRIgine  100 mg Oral BID  . lisinopril  5 mg Oral Daily  . metoprolol succinate  12.5 mg Oral Daily  . naphazoline-pheniramine  1 drop Both Eyes BID  . pantoprazole  40 mg Oral Daily  . venlafaxine XR  150 mg Oral BID    Allergies: No Known Allergies  Social History  Social History  . Marital status: Single    Spouse name: N/A  . Number of children: N/A  . Years of education: N/A   Occupational History  . Not on file.   Social History Main Topics  . Smoking status: Current Every Day Smoker    Packs/day: 0.50    Years: 40.00    Types: Cigarettes  . Smokeless tobacco: Never Used     Comment: uses cigarelle- 4 per day  . Alcohol use No     Comment: HISTORY OF DRUG & ETOH 09/19/2004  . Drug use: No     Comment: none since 09/19/2004  . Sexual activity: Not on file   Other Topics Concern  . Not on file   Social History Narrative  . No narrative on file     Family History  Problem Relation Age of Onset  . Diabetes Mother   . Cancer Father      Review of Systems: All other systems reviewed and are otherwise negative except as noted  above.  Physical Exam: Vitals:   10/01/16 0500 10/01/16 0608 10/01/16 0646 10/01/16 0700  BP:  113/67  108/73  Pulse: 63 64  (!) 58  Resp: 15 12  14   Temp:   97.8 F (36.6 C)   TempSrc:   Oral   SpO2: 94% 95%  94%    GEN- The patient is elderly appearing, alert and oriented x 3 today.   HEENT: normocephalic, atraumatic; sclera clear, conjunctiva pink; hearing intact; oropharynx clear; neck supple Lungs- Clear to ausculation bilaterally, normal work of breathing.  No wheezes, rales, rhonchi Heart- Regular rate and rhythm  GI- soft, non-tender, non-distended, bowel sounds present Extremities- no clubbing, cyanosis, or edema  MS- no significant deformity or atrophy Skin- warm and dry, no rash or lesion Psych- euthymic mood, full affect Neuro- strength and sensation are intact  Labs:  Lab Results  Component Value Date   WBC 18.0 (H) 08/05/2016   HGB 14.6 10/01/2016   HCT 43.0 10/01/2016   MCV 92 08/05/2016   PLT 292 08/05/2016    Recent Labs Lab 10/01/16 0454  NA 137  K 4.6  CL 106  CO2 23  BUN 12  CREATININE 0.77  CALCIUM 9.2  GLUCOSE 73      Radiology/Studies: Dg Chest 2 View  Result Date: 09/30/2016 CLINICAL DATA:  Defibrillator fired tonight. Recent fracture of the left humerus. EXAM: CHEST  2 VIEW COMPARISON:  07/19/2016 FINDINGS: Postoperative changes in the mediastinum. Cardiac pacemaker. Normal heart size and pulmonary vascularity. No focal airspace disease or consolidation in the lungs. No blunting of costophrenic angles. No pneumothorax. Mediastinal contours appear intact. 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 Degenerative changes in both shoulders. Thoracolumbar scoliosis with degenerative change. IMPRESSION: No active cardiopulmonary disease. Electronically Signed   By: Lucienne Capers M.D.   On: 09/30/2016 23:57    CBS:WHQPR rhythm (personally reviewed)  TELEMETRY: sinus rhythm (personally reviewed)  DEVICE HISTORY:  Biotronik dual chamber ICD implanted 2014 for secondary prevention  Assessment/Plan: 1.  VT Pt received appropriate ICD therapy for VT Keep K >3.9, Mg >1.9 No driving x6 months Will not add AAD therapy as this is his first appropriate shock  2.  Noise on RV lead Needs to be replaced With age and need for possible future MRI's, would recommend lead extraction and insertion of new RV lead. Unfortunately, he has 2 dogs at home that he has no one to care for and the  soonest we could do extraction is tomorrow afternoon.  We have discussed extensively risks of leaving the hospital, but he is clear in his decision to go home and come back tomorrow for lead revision. We will call the patient this afternoon with instructions.  He is advised to return to the ER with recurrent shocks. Do not want to adjust sensitivity for fear of undersensing true ventricular arrhythmias. This is a very difficult situation with his home needs. He is aware of risks and would like to be discharged from ER and come back tomorrow.   3.  CAD No recent ischemic symptoms Continue current therapy  4.  HTN Stable No change required today  Dr Lovena Le saw patient this morning and formulated plan of care.    Vallarie Mare 10/01/2016 8:42 AM   EP Attending  Patient seen and examined. I have reviewed the findings as noted above by Chanetta Marshall, NP. The patient has VT, chronic systolic heart failure and presents with more VT and successful ICD therapies. He has been found to have noise on his lead and an elevated shocking impedence. His exam is as noted above. He has a frozen left shoulder. I have recommended he stay in the hospital but he insists on going home to take care of his dogs. He will return on 9/19 to undergo ICD lead removal and insertion of a new ICD lead. I have reviewed the indications, risks/benefits/goals/expectations of the procedure with the patient and he wishes to proceed.  Mikle Bosworth.D.

## 2016-10-01 NOTE — Discharge Instructions (Signed)
The office will call later today with instructions for lead extraction and replacement. We are trying to do 10/02/16. No driving Call us with any problems. The office number is [phone]

## 2016-10-01 NOTE — ED Notes (Signed)
Tech at bedside to interrogate pacer

## 2016-10-02 ENCOUNTER — Inpatient Hospital Stay (HOSPITAL_COMMUNITY): Payer: Federal, State, Local not specified - PPO

## 2016-10-02 ENCOUNTER — Ambulatory Visit (HOSPITAL_COMMUNITY)
Admission: RE | Admit: 2016-10-02 | Discharge: 2016-10-03 | Disposition: A | Discharge status: Home / Self Care | Payer: Federal, State, Local not specified - PPO | Source: Ambulatory Visit | Attending: Internal Medicine | Admitting: Internal Medicine

## 2016-10-02 ENCOUNTER — Encounter (HOSPITAL_COMMUNITY): Payer: Self-pay | Admitting: *Deleted

## 2016-10-02 ENCOUNTER — Inpatient Hospital Stay (HOSPITAL_COMMUNITY): Payer: Federal, State, Local not specified - PPO | Admitting: Certified Registered Nurse Anesthetist

## 2016-10-02 ENCOUNTER — Encounter (HOSPITAL_COMMUNITY)
Admission: RE | Disposition: A | Discharge status: Home / Self Care | Payer: Self-pay | Source: Ambulatory Visit | Attending: Internal Medicine

## 2016-10-02 DIAGNOSIS — Y718 Miscellaneous cardiovascular devices associated with adverse incidents, not elsewhere classified: Secondary | ICD-10-CM | POA: Insufficient documentation

## 2016-10-02 DIAGNOSIS — I472 Ventricular tachycardia: Secondary | ICD-10-CM | POA: Diagnosis not present

## 2016-10-02 DIAGNOSIS — Z79899 Other long term (current) drug therapy: Secondary | ICD-10-CM | POA: Insufficient documentation

## 2016-10-02 DIAGNOSIS — T82110A Breakdown (mechanical) of cardiac electrode, initial encounter: Secondary | ICD-10-CM | POA: Diagnosis not present

## 2016-10-02 DIAGNOSIS — Z951 Presence of aortocoronary bypass graft: Secondary | ICD-10-CM | POA: Insufficient documentation

## 2016-10-02 DIAGNOSIS — T82897A Other specified complication of cardiac prosthetic devices, implants and grafts, initial encounter: Secondary | ICD-10-CM | POA: Diagnosis not present

## 2016-10-02 DIAGNOSIS — I10 Essential (primary) hypertension: Secondary | ICD-10-CM | POA: Insufficient documentation

## 2016-10-02 DIAGNOSIS — Z7982 Long term (current) use of aspirin: Secondary | ICD-10-CM | POA: Diagnosis not present

## 2016-10-02 DIAGNOSIS — I255 Ischemic cardiomyopathy: Secondary | ICD-10-CM | POA: Diagnosis not present

## 2016-10-02 DIAGNOSIS — N529 Male erectile dysfunction, unspecified: Secondary | ICD-10-CM | POA: Diagnosis not present

## 2016-10-02 DIAGNOSIS — Z95810 Presence of automatic (implantable) cardiac defibrillator: Secondary | ICD-10-CM | POA: Diagnosis present

## 2016-10-02 DIAGNOSIS — I2510 Atherosclerotic heart disease of native coronary artery without angina pectoris: Secondary | ICD-10-CM | POA: Diagnosis not present

## 2016-10-02 HISTORY — DX: Headache: R51

## 2016-10-02 HISTORY — DX: Pneumonia, unspecified organism: J18.9

## 2016-10-02 HISTORY — DX: Restless legs syndrome: G25.81

## 2016-10-02 HISTORY — DX: Headache, unspecified: R51.9

## 2016-10-02 HISTORY — PX: ICD LEAD REMOVAL: SHX5855

## 2016-10-02 HISTORY — PX: TEE WITHOUT CARDIOVERSION: SHX5443

## 2016-10-02 LAB — SURGICAL PCR SCREEN
MRSA, PCR: NEGATIVE
Staphylococcus aureus: NEGATIVE

## 2016-10-02 LAB — ABO/RH: ABO/RH(D): O POS

## 2016-10-02 LAB — PREPARE RBC (CROSSMATCH)

## 2016-10-02 SURGERY — REMOVAL, ELECTRODE LEAD, ICD
Anesthesia: General | Site: Chest

## 2016-10-02 MED ORDER — ASPIRIN EC 81 MG PO TBEC
81.0000 mg | DELAYED_RELEASE_TABLET | Freq: Every day | ORAL | Status: DC
  Administered 2016-10-03: 81 mg via ORAL
  Filled 2016-10-02: qty 1

## 2016-10-02 MED ORDER — FENTANYL CITRATE (PF) 250 MCG/5ML IJ SOLN
INTRAMUSCULAR | Status: AC
  Filled 2016-10-02: qty 5

## 2016-10-02 MED ORDER — CEFAZOLIN SODIUM-DEXTROSE 1-4 GM/50ML-% IV SOLN
1.0000 g | Freq: Four times a day (QID) | INTRAVENOUS | Status: AC
  Administered 2016-10-02 – 2016-10-03 (×3): 1 g via INTRAVENOUS
  Filled 2016-10-02 (×3): qty 50

## 2016-10-02 MED ORDER — ONDANSETRON HCL 4 MG/2ML IJ SOLN
INTRAMUSCULAR | Status: DC | PRN
  Administered 2016-10-02: 4 mg via INTRAVENOUS

## 2016-10-02 MED ORDER — BUPRENORPHINE HCL-NALOXONE HCL 8-2 MG SL SUBL
1.0000 | SUBLINGUAL_TABLET | Freq: Two times a day (BID) | SUBLINGUAL | Status: DC
  Administered 2016-10-02 – 2016-10-03 (×2): 1 via SUBLINGUAL
  Filled 2016-10-02 (×2): qty 1

## 2016-10-02 MED ORDER — CHLORHEXIDINE GLUCONATE 4 % EX LIQD: 60.0000 mL | Freq: Once | CUTANEOUS | Status: DC

## 2016-10-02 MED ORDER — ROCURONIUM BROMIDE 10 MG/ML (PF) SYRINGE
PREFILLED_SYRINGE | INTRAVENOUS | Status: DC | PRN
  Administered 2016-10-02: 20 mg via INTRAVENOUS
  Administered 2016-10-02: 50 mg via INTRAVENOUS
  Administered 2016-10-02 (×2): 20 mg via INTRAVENOUS

## 2016-10-02 MED ORDER — CLONAZEPAM 0.5 MG PO TABS
1.0000 mg | ORAL_TABLET | Freq: Every day | ORAL | Status: DC
  Administered 2016-10-03: 1 mg via ORAL
  Filled 2016-10-02: qty 2

## 2016-10-02 MED ORDER — NAPROXEN SODIUM 275 MG PO TABS
275.0000 mg | ORAL_TABLET | Freq: Two times a day (BID) | ORAL | Status: DC | PRN
  Filled 2016-10-02: qty 1

## 2016-10-02 MED ORDER — PROPOFOL 10 MG/ML IV BOLUS
INTRAVENOUS | Status: AC
  Filled 2016-10-02: qty 20

## 2016-10-02 MED ORDER — BUPRENORPHINE HCL-NALOXONE HCL 2-0.5 MG SL SUBL
2.0000 | SUBLINGUAL_TABLET | Freq: Two times a day (BID) | SUBLINGUAL | Status: DC
  Administered 2016-10-03: 2 via SUBLINGUAL
  Filled 2016-10-02: qty 2

## 2016-10-02 MED ORDER — SODIUM CHLORIDE 0.9 % IR SOLN
80.0000 mg | Status: AC
  Administered 2016-10-02: 80 mg
  Filled 2016-10-02 (×2): qty 2

## 2016-10-02 MED ORDER — METOPROLOL SUCCINATE ER 25 MG PO TB24
12.5000 mg | ORAL_TABLET | Freq: Every day | ORAL | Status: DC
  Administered 2016-10-03: 12.5 mg via ORAL
  Filled 2016-10-02: qty 1

## 2016-10-02 MED ORDER — SUGAMMADEX SODIUM 200 MG/2ML IV SOLN
INTRAVENOUS | Status: DC | PRN
  Administered 2016-10-02: 300 mg via INTRAVENOUS

## 2016-10-02 MED ORDER — LIDOCAINE HCL (PF) 1 % IJ SOLN
INTRAMUSCULAR | Status: DC | PRN
  Administered 2016-10-02: 40 mL

## 2016-10-02 MED ORDER — LIDOCAINE 2% (20 MG/ML) 5 ML SYRINGE
INTRAMUSCULAR | Status: DC | PRN
  Administered 2016-10-02: 50 mg via INTRAVENOUS

## 2016-10-02 MED ORDER — CLONAZEPAM 0.5 MG PO TABS
3.0000 mg | ORAL_TABLET | Freq: Every day | ORAL | Status: DC
  Administered 2016-10-02: 3 mg via ORAL
  Filled 2016-10-02: qty 6

## 2016-10-02 MED ORDER — SODIUM CHLORIDE 0.9 % IV SOLN
INTRAVENOUS | Status: DC | PRN
  Administered 2016-10-02: 500 mL

## 2016-10-02 MED ORDER — METOPROLOL SUCCINATE 12.5 MG HALF TABLET
12.5000 mg | ORAL_TABLET | Freq: Every day | ORAL | Status: AC
  Administered 2016-10-02: 12.5 mg via ORAL
  Filled 2016-10-02 (×2): qty 1

## 2016-10-02 MED ORDER — VENLAFAXINE HCL ER 75 MG PO CP24
150.0000 mg | ORAL_CAPSULE | Freq: Two times a day (BID) | ORAL | Status: DC
  Administered 2016-10-02 – 2016-10-03 (×2): 150 mg via ORAL
  Filled 2016-10-02 (×2): qty 2

## 2016-10-02 MED ORDER — PHENYLEPHRINE 40 MCG/ML (10ML) SYRINGE FOR IV PUSH (FOR BLOOD PRESSURE SUPPORT)
PREFILLED_SYRINGE | INTRAVENOUS | Status: DC | PRN
  Administered 2016-10-02: 40 ug via INTRAVENOUS

## 2016-10-02 MED ORDER — MIDAZOLAM HCL 2 MG/2ML IJ SOLN
INTRAMUSCULAR | Status: AC
  Filled 2016-10-02: qty 2

## 2016-10-02 MED ORDER — LAMOTRIGINE 25 MG PO TABS
100.0000 mg | ORAL_TABLET | Freq: Two times a day (BID) | ORAL | Status: DC
  Administered 2016-10-02 – 2016-10-03 (×2): 100 mg via ORAL
  Filled 2016-10-02 (×2): qty 4

## 2016-10-02 MED ORDER — MUPIROCIN 2 % EX OINT
TOPICAL_OINTMENT | CUTANEOUS | Status: AC
  Filled 2016-10-02: qty 22

## 2016-10-02 MED ORDER — EPHEDRINE SULFATE-NACL 50-0.9 MG/10ML-% IV SOSY: PREFILLED_SYRINGE | INTRAVENOUS | Status: DC | PRN

## 2016-10-02 MED ORDER — OXYCODONE HCL 5 MG PO TABS: 5.0000 mg | ORAL_TABLET | Freq: Once | ORAL | Status: DC | PRN

## 2016-10-02 MED ORDER — ETOMIDATE 2 MG/ML IV SOLN
INTRAVENOUS | Status: DC | PRN
  Administered 2016-10-02: 12 mg via INTRAVENOUS

## 2016-10-02 MED ORDER — CEFAZOLIN SODIUM-DEXTROSE 2-4 GM/100ML-% IV SOLN
INTRAVENOUS | Status: AC
  Filled 2016-10-02: qty 100

## 2016-10-02 MED ORDER — FENTANYL CITRATE (PF) 100 MCG/2ML IJ SOLN
INTRAMUSCULAR | Status: DC | PRN
  Administered 2016-10-02: 100 ug via INTRAVENOUS
  Administered 2016-10-02: 50 ug via INTRAVENOUS

## 2016-10-02 MED ORDER — PANTOPRAZOLE SODIUM 40 MG PO TBEC: 40.0000 mg | DELAYED_RELEASE_TABLET | Freq: Every day | ORAL | Status: DC | PRN

## 2016-10-02 MED ORDER — ACETAMINOPHEN 325 MG PO TABS: 325.0000 mg | ORAL_TABLET | ORAL | Status: DC | PRN

## 2016-10-02 MED ORDER — LACTATED RINGERS IV SOLN
INTRAVENOUS | Status: DC | PRN
  Administered 2016-10-02: 15:00:00 via INTRAVENOUS

## 2016-10-02 MED ORDER — MIDAZOLAM HCL 5 MG/5ML IJ SOLN
INTRAMUSCULAR | Status: DC | PRN
  Administered 2016-10-02: 2 mg via INTRAVENOUS

## 2016-10-02 MED ORDER — ONDANSETRON HCL 4 MG/2ML IJ SOLN: 4.0000 mg | Freq: Four times a day (QID) | INTRAMUSCULAR | Status: DC | PRN

## 2016-10-02 MED ORDER — LISINOPRIL 5 MG PO TABS
5.0000 mg | ORAL_TABLET | Freq: Every day | ORAL | Status: DC
  Administered 2016-10-03: 5 mg via ORAL
  Filled 2016-10-02: qty 1

## 2016-10-02 MED ORDER — LACTATED RINGERS IV SOLN
INTRAVENOUS | Status: DC
  Administered 2016-10-02: 15:00:00 via INTRAVENOUS

## 2016-10-02 MED ORDER — LACTATED RINGERS IV SOLN: INTRAVENOUS | Status: DC

## 2016-10-02 MED ORDER — FENTANYL CITRATE (PF) 100 MCG/2ML IJ SOLN: 25.0000 ug | INTRAMUSCULAR | Status: DC | PRN

## 2016-10-02 MED ORDER — CEFAZOLIN SODIUM-DEXTROSE 2-4 GM/100ML-% IV SOLN
2.0000 g | INTRAVENOUS | Status: AC
  Administered 2016-10-02: 2 g via INTRAVENOUS

## 2016-10-02 MED ORDER — SODIUM CHLORIDE 0.9 % IV SOLN: INTRAVENOUS | Status: DC

## 2016-10-02 MED ORDER — PHENYLEPHRINE HCL 10 MG/ML IJ SOLN
INTRAVENOUS | Status: DC | PRN
  Administered 2016-10-02: 10 ug/min via INTRAVENOUS

## 2016-10-02 MED ORDER — OXYCODONE HCL 5 MG/5ML PO SOLN: 5.0000 mg | Freq: Once | ORAL | Status: DC | PRN

## 2016-10-02 MED ORDER — NAPHAZOLINE-PHENIRAMINE 0.025-0.3 % OP SOLN
1.0000 [drp] | Freq: Two times a day (BID) | OPHTHALMIC | Status: DC
  Administered 2016-10-02: 1 [drp] via OPHTHALMIC
  Filled 2016-10-02: qty 5

## 2016-10-02 MED ORDER — NITROGLYCERIN 0.4 MG SL SUBL: 0.4000 mg | SUBLINGUAL_TABLET | SUBLINGUAL | Status: DC | PRN

## 2016-10-02 MED ORDER — DEXAMETHASONE SODIUM PHOSPHATE 10 MG/ML IJ SOLN
INTRAMUSCULAR | Status: DC | PRN
  Administered 2016-10-02: 4 mg via INTRAVENOUS

## 2016-10-02 MED ORDER — SODIUM CHLORIDE 0.9 % IV SOLN: Freq: Once | INTRAVENOUS | Status: DC

## 2016-10-02 SURGICAL SUPPLY — 57 items
BAG BANDED W/RUBBER/TAPE 36X54 (MISCELLANEOUS) IMPLANT
BAG DECANTER FOR FLEXI CONT (MISCELLANEOUS) ×6 IMPLANT
BLADE 10 SAFETY STRL DISP (BLADE) ×3 IMPLANT
BLADE CLIPPER SURG (BLADE) ×3 IMPLANT
BLADE OSCILLATING /SAGITTAL (BLADE) ×3 IMPLANT
BLADE STERNUM SYSTEM 6 (BLADE) IMPLANT
BLADE SURG 10 STRL SS (BLADE) ×3 IMPLANT
BNDG COHESIVE 4X5 WHT NS (GAUZE/BANDAGES/DRESSINGS) IMPLANT
CANISTER SUCT 3000ML PPV (MISCELLANEOUS) ×3 IMPLANT
COIL ONE TIE COMPRESSION (MISCELLANEOUS) ×3 IMPLANT
COVER BACK TABLE 60X90IN (DRAPES) ×3 IMPLANT
DRAPE C-ARM 42X72 X-RAY (DRAPES) ×3 IMPLANT
DRAPE CARDIOVASCULAR INCISE (DRAPES) ×1
DRAPE HALF SHEET 40X57 (DRAPES) ×6 IMPLANT
DRAPE INCISE IOBAN 66X45 STRL (DRAPES) IMPLANT
DRAPE SRG 135X102X78XABS (DRAPES) ×2 IMPLANT
DRSG TEGADERM 4X4.75 (GAUZE/BANDAGES/DRESSINGS) ×6 IMPLANT
ELECT REM PT RETURN 9FT ADLT (ELECTROSURGICAL) ×6
ELECTRODE REM PT RTRN 9FT ADLT (ELECTROSURGICAL) ×4 IMPLANT
FELT TEFLON 1X6 (MISCELLANEOUS) IMPLANT
GAUZE SPONGE 4X4 12PLY STRL (GAUZE/BANDAGES/DRESSINGS) ×3 IMPLANT
GAUZE SPONGE 4X4 16PLY XRAY LF (GAUZE/BANDAGES/DRESSINGS) IMPLANT
GLOVE BIO SURGEON STRL SZ 6.5 (GLOVE) ×3 IMPLANT
GLOVE BIO SURGEON STRL SZ7.5 (GLOVE) ×3 IMPLANT
GLOVE BIOGEL PI IND STRL 6.5 (GLOVE) ×2 IMPLANT
GLOVE BIOGEL PI IND STRL 7.0 (GLOVE) ×2 IMPLANT
GLOVE BIOGEL PI IND STRL 7.5 (GLOVE) ×6 IMPLANT
GLOVE BIOGEL PI INDICATOR 6.5 (GLOVE) ×1
GLOVE BIOGEL PI INDICATOR 7.0 (GLOVE) ×1
GLOVE BIOGEL PI INDICATOR 7.5 (GLOVE) ×3
GLOVE ECLIPSE 8.0 STRL XLNG CF (GLOVE) ×3 IMPLANT
GOWN STRL REUS W/ TWL LRG LVL3 (GOWN DISPOSABLE) ×8 IMPLANT
GOWN STRL REUS W/ TWL XL LVL3 (GOWN DISPOSABLE) ×2 IMPLANT
GOWN STRL REUS W/TWL LRG LVL3 (GOWN DISPOSABLE) ×4
GOWN STRL REUS W/TWL XL LVL3 (GOWN DISPOSABLE) ×1
KIT ROOM TURNOVER OR (KITS) ×3 IMPLANT
LEAD PLEXA S DX 65/17 414006 (Lead) ×3 IMPLANT
NEEDLE PERC 18GX7CM (NEEDLE) ×3 IMPLANT
NS IRRIG 1000ML POUR BTL (IV SOLUTION) ×3 IMPLANT
PAD ARMBOARD 7.5X6 YLW CONV (MISCELLANEOUS) ×6 IMPLANT
PAD ELECT DEFIB RADIOL ZOLL (MISCELLANEOUS) ×3 IMPLANT
SHEATH CLASSIC 9F (SHEATH) ×3 IMPLANT
SHEATH EVOLUTION SHORTE RL 11F (SHEATH) ×3 IMPLANT
SHEATH PINNACLE 6F 10CM (SHEATH) ×3 IMPLANT
SPONGE LAP 18X18 X RAY DECT (DISPOSABLE) ×3 IMPLANT
STYLET LIBERATOR LOCKING (MISCELLANEOUS) ×3 IMPLANT
SUT PROLENE 2 0 CT2 30 (SUTURE) IMPLANT
SUT PROLENE 2 0 SH DA (SUTURE) IMPLANT
SUT VIC AB 2-0 CT2 18 VCP726D (SUTURE) IMPLANT
SUT VIC AB 3-0 X1 27 (SUTURE) IMPLANT
SYR 30ML SLIP (SYRINGE) ×6 IMPLANT
TOWEL GREEN STERILE (TOWEL DISPOSABLE) ×3 IMPLANT
TOWEL OR 17X24 6PK STRL BLUE (TOWEL DISPOSABLE) IMPLANT
TOWEL OR 17X26 10 PK STRL BLUE (TOWEL DISPOSABLE) IMPLANT
TRAY FOLEY W/METER SILVER 16FR (SET/KITS/TRAYS/PACK) ×3 IMPLANT
TUBE CONNECTING 12X1/4 (SUCTIONS) ×3 IMPLANT
YANKAUER SUCT BULB TIP NO VENT (SUCTIONS) ×3 IMPLANT

## 2016-10-02 NOTE — Op Note (Signed)
EP procedure/operative note  Procedure performed: Extraction of a malfunctioning ICD lead and insertion of a new ICD lead utilizing the previously implanted ICD generator  Preoperative diagnosis: Failed ICD lead secondary to noise and elevated high-voltage impedance, in the setting of an ischemic cardiomyopathy and ventricular tachycardia  Postoperative diagnosis: Same as preoperative diagnosis  Description of the procedure: after informed consent was obtained, the patient was taken to the operating room in the fasting state. The anesthesia service was utilized to provide invasive hemodynamic arterial monitoring. General endotracheal anesthesia was also provided. A 6 French sheath was placed in the right femoral vein. 20 cc of lidocaine was infiltrated into the left infraclavicular region. A 5 cm incision was carried out. Electrocautery was utilized to dissect down to the ICD pocket. The ICD generator was removed with gentle traction. The lead was disconnected from the ICD generator. Electrocautery was utilized to free up the fibrous scar tissue around the ICD lead. The sewing sleeve was removed. A stylette was inserted into the lead body and the helix retracted. The lead was cut. A Cook Liberator locking stylette was inserted into the body of the lead and fixed in place. A Cook one tie proximal suture was placed. The [facility] 11 [place] short RL sheath was advanced over the lead and into the fibrous scar tissue. After the initial binding sites were traversed, traction was placed on the lead and the lead was removed in total. There was no hemodynamic consequences. The left subclavian vein was punctured previously and a 9 French sheath advanced into the vein followed by a new Biotronik active fixation defibrillation lead, serial [id number], which was advanced into the right ventricle and placed on the RV apical septum where R waves measured 18 mV, and P waves measured 10 mV. The pacing impedance was 521 ohms. The  shocking impedance was 64 ohms. The threshold was 0.5 V at 0.4 ms. The lead was secured to the fascia with silk suture and the sewing sleeve was secured with silk suture. The lead was then connected to the old defibrillation generator and placed back in the subcutaneous pocket. The pocket was irrigated with antibiotic irrigation. The incision was closed with 2 layers of Vicryl suture. Defibrillation threshold testing was carried out.  The patient was still sedated with general endotracheal anesthesia and ventricular fibrillation was induced with a T-wave shock. A 20 J shock was then delivered terminated ventricular fibrillation restoring sinus rhythm. No additional defibrillation threshold testing was carried out and the patient was returned to the recovery area in stable condition.  Complications: There were no immediate procedure complications  Conclusion: Successful extraction of a previously implanted Biotronik ICD lead which had developed ICD lead noise and an elevated shocking impedance and successful insertion of a new Biotronik ICD lead connected to the old ICD generator.  Cristopher Peru, M.D.

## 2016-10-02 NOTE — Anesthesia Procedure Notes (Signed)
Arterial Line Insertion Start/End9/19/2018 3:10 PM, 10/02/2016 3:15 PM Performed by: Colin Benton, CRNA  Patient location: Pre-op. Preanesthetic checklist: patient identified, IV checked, site marked, risks and benefits discussed, surgical consent, monitors and equipment checked, pre-op evaluation, timeout performed and anesthesia consent Lidocaine 1% used for infiltration Right, radial was placed Catheter size: 20 G Hand hygiene performed  and maximum sterile barriers used  Allen's test indicative of satisfactory collateral circulation Attempts: 2 Procedure performed without using ultrasound guided technique. Following insertion, dressing applied and Biopatch. Post procedure assessment: normal and unchanged  Patient tolerated the procedure well with no immediate complications.

## 2016-10-02 NOTE — Discharge Summary (Signed)
ELECTROPHYSIOLOGY PROCEDURE DISCHARGE SUMMARY    Patient ID: Hector Byrd,  MRN: [id number], DOB/AGE: 63-Apr-1957 63 y.o.  Admit date: 10/02/2016 Discharge date: 10/03/2016  Primary Care Physician: Shawnee Knapp, MD Primary Cardiologist: Radford Pax Electrophysiologist: Lovena Le  Primary Discharge Diagnosis:  ICD lead fracture with removal of previously implanted RV lead and insertion of new RV lead this admission  Secondary Discharge Diagnosis:  1.  VT with appropriate ICD therapy 2.  ICM 3.  HTN  No Known Allergies   Procedures This Admission:  1.  Extraction of a previously implanted RV lead and insertion of new RV lead on 10/02/16 by Dr Lovena Le. See op note for full details. The patient's previously implanted Biotronik ICD was used. here were no immediate post procedure complications. 2.  CXR on 10/03/16 demonstrated no pneumothorax status post device implantation.   Brief HPI: Hector Byrd is a 61 y.o. male with recent appropriate ICD therapy. Device interrogation demonstrated noise on RV lead as well as abnormalities in HV lead impedance.  Risks, benefits, and alternatives to ICD lead revision were reviewed with the patient who wished to proceed.   Hospital Course:  The patient was admitted and underwent ICD lead revision with details as outlined above.   Left chest was without hematoma or ecchymosis.  The device was interrogated and found to be functioning normally.  CXR was obtained and demonstrated no pneumothorax status post device implantation.  Wound care, arm mobility, and restrictions were reviewed with the patient.  The patient was examined and considered stable for discharge to home.   The patient's discharge medications include an ACE-I (Lisinopril) and beta blocker (Toprol).   Physical Exam: Vitals:   10/02/16 2012 10/02/16 2046 10/03/16 0150 10/03/16 0522  BP: 105/67 121/67 102/60 (!) 101/57  Pulse: 68 71 70 66  Resp: 16 18 16 16   Temp:  98.4 F (36.9 C) 98.2 F  (36.8 C) 98.7 F (37.1 C)  TempSrc:  Oral Oral Oral  SpO2: 91% 100% 94% 100%  Weight:  138 lb 7.2 oz (62.8 kg)  137 lb 2 oz (62.2 kg)  Height:  5\' 8"  (1.727 m)      GEN- The patient is well appearing, alert and oriented x 3 today.   HEENT: normocephalic, atraumatic; sclera clear, conjunctiva pink; hearing intact; oropharynx clear; neck supple  Lungs- Clear to ausculation bilaterally, normal work of breathing.  No wheezes, rales, rhonchi Heart- Regular rate and rhythm  GI- soft, non-tender, non-distended, bowel sounds present  Extremities- no clubbing, cyanosis, or edema  MS- no significant deformity or atrophy Skin- warm and dry, no rash or lesion, left chest without hematoma/ecchymosis Psych- euthymic mood, full affect Neuro- strength and sensation are intact   Labs:   Lab Results  Component Value Date   WBC 18.0 (H) 08/05/2016   HGB 14.6 10/01/2016   HCT 43.0 10/01/2016   MCV 92 08/05/2016   PLT 292 08/05/2016     Recent Labs Lab 10/01/16 0454  NA 137  K 4.6  CL 106  CO2 23  BUN 12  CREATININE 0.77  CALCIUM 9.2  GLUCOSE 73    Discharge Medications:  Allergies as of 10/03/2016   No Known Allergies     Medication List    TAKE these medications   aspirin EC 81 MG tablet Take 81 mg by mouth daily.   buprenorphine-naloxone 8-2 mg Subl SL tablet Commonly known as:  SUBOXONE Place 0.5-1 tablets under the tongue See admin instructions. Up to 4  times daily 1 tablet in the morning, 1/2 tablet at 10am and 5pm, 1 tablet at 8pm   clonazePAM 1 MG tablet Commonly known as:  KLONOPIN Take 1-3 mg by mouth 2 (two) times daily. 1 mg by mouth in the morning & 3 mg at bedtime.   lamoTRIgine 100 MG tablet Commonly known as:  LAMICTAL Take 100 mg by mouth 2 (two) times daily.   lisinopril 5 MG tablet Commonly known as:  PRINIVIL,ZESTRIL Take 1 tablet (5 mg total) by mouth daily.   metoprolol succinate 25 MG 24 hr tablet Commonly known as:  TOPROL XL Take 0.5  tablets (12.5 mg total) by mouth daily.   naphazoline-pheniramine 0.025-0.3 % ophthalmic solution Commonly known as:  NAPHCON-A Place 1 drop into both eyes 2 (two) times daily.   naproxen sodium 220 MG tablet Commonly known as:  ANAPROX Take 220 mg by mouth 2 (two) times daily as needed (pain).   nitroGLYCERIN 0.4 MG SL tablet Commonly known as:  NITROSTAT Place 0.4 mg under the tongue every 5 (five) minutes as needed for chest pain (MAX 3 TABLETS).   omeprazole 20 MG capsule Commonly known as:  PRILOSEC Take 20 mg by mouth daily as needed (heartburn).   venlafaxine XR 150 MG 24 hr capsule Commonly known as:  EFFEXOR-XR Take 150 mg by mouth 2 (two) times daily.            Discharge Care Instructions        Start     Ordered   10/03/16 0000  Increase activity slowly     10/03/16 0714   10/03/16 0000  Diet - low sodium heart healthy     10/03/16 4034      Disposition:  Discharge Instructions    Diet - low sodium heart healthy    Complete by:  As directed    Increase activity slowly    Complete by:  As directed      Follow-up Information    [facility] Follow up on 10/16/2016.   Specialty:  Cardiology Why:  at [facility] [place] information: [address] [place]       Evans Lance, MD Follow up on 01/03/2017.   Specialty:  Cardiology Why:  at 11AM Contact information: [address] [place] [id number] [phone]           Duration of Discharge Encounter: Greater than 30 minutes including physician time.  Signed, Chanetta Marshall, NP 10/03/2016 7:14 AM   EP attending  Patient seen and examined. Agree with the findings as noted above. Interrogation of his single-chamber ICD demonstrates normal device function and minimal hematoma. Chest x-ray demonstrates no pneumothorax. Pocket demonstrates no hematoma. He will be discharged home with usual  follow-up.  Cristopher Peru, M.D.

## 2016-10-02 NOTE — Progress Notes (Signed)
  Echocardiogram Echocardiogram Transesophageal has been performed.  Hector Byrd 10/02/2016, 5:36 PM

## 2016-10-02 NOTE — H&P (View-Only) (Signed)
ELECTROPHYSIOLOGY CONSULT NOTE    Patient ID: Hector Byrd MRN: [id number], DOB/AGE: [birth year] 61 y.o.  Admit date: 09/30/2016 Date of Consult: 10/01/2016  Primary Physician: Shawnee Knapp, MD Primary Cardiologist: Radford Pax Electrophysiologist: Lovena Le  Patient Profile: Hector Byrd is a 61 y.o. male with a history of ICM, hypertension, and VT who is being seen today for the evaluation of ICD shock at the request of Dr Hassell Done.  HPI:  Hector Byrd is a 61 y.o. male who has been in his usual state of health. He has recently undergone workup for kidney mass that is still being evaluated. Last night, he received an ICD shock for which he called 911.  On arrival to the ER, his device was interrogated and he was found to have received appropriate therapy for VT with HV shock terminating rhythm.  He also has noise on his RV lead that is being sensed.  His HV lead impedance spiked in July but then has been relatively stable.  RV lead impedance is stable and sensing is stable.  He is very worried about his 2 dogs that he has at home. He does not have anyone to care for them and does not want to stay in hospital.   He denies chest pain, palpitations, dyspnea, PND, orthopnea, nausea, vomiting, dizziness, syncope, edema, weight gain, or early satiety.  Past Medical History:  Diagnosis Date  . AICD (automatic cardioverter/defibrillator) present   . Anxiety   . Arthritis   . CAD (coronary artery disease)    a. s/p stent x2 and then CABG 1996.  . Cancer of kidney ([place])    Kidney   . Cardiomyopathy, ischemic    EF 30% by MUGA s/p AICD  . Chronic systolic CHF (congestive heart failure) ([place])   . Depression   . Dyslipidemia   . Dyspnea    with exertion  . Erectile dysfunction   . GERD (gastroesophageal reflux disease)   . Hypertension   . Myocardial infarction ([place])    [id number]  . Rotator cuff tear   . S/P CABG (coronary artery bypass graft)      Surgical History:  Past Surgical History:    Procedure Laterality Date  . CARDIAC CATHETERIZATION    . Cardiac stents     prior to 2005- 3 times total  . COLONOSCOPY    . CORONARY ARTERY BYPASS GRAFT  2005  . CORONARY STENT PLACEMENT    . IMPLANTABLE CARDIOVERTER DEFIBRILLATOR IMPLANT  12-07-2012   BTK single chamber ICD implanted by Dr Lovena Le  . IMPLANTABLE CARDIOVERTER DEFIBRILLATOR IMPLANT N/A 12/07/2012   Procedure: IMPLANTABLE CARDIOVERTER DEFIBRILLATOR IMPLANT;  Surgeon: Evans Lance, MD;  Location: [facility] CATH LAB;  Service: Cardiovascular;  Laterality: N/A;  . Kidney resection     small amount of kidney  . ROTATOR CUFF REPAIR       Inpatient Medications: . aspirin EC  81 mg Oral Daily  . buprenorphine-naloxone  1 tablet Sublingual BID WC   And  . buprenorphine-naloxone  2 tablet Sublingual BID  . clonazePAM  1 mg Oral Daily   And  . clonazePAM  3 mg Oral QHS  . lamoTRIgine  100 mg Oral BID  . lisinopril  5 mg Oral Daily  . metoprolol succinate  12.5 mg Oral Daily  . naphazoline-pheniramine  1 drop Both Eyes BID  . pantoprazole  40 mg Oral Daily  . venlafaxine XR  150 mg Oral BID    Allergies: No Known Allergies  Social History  Social History  . Marital status: Single    Spouse name: N/A  . Number of children: N/A  . Years of education: N/A   Occupational History  . Not on file.   Social History Main Topics  . Smoking status: Current Every Day Smoker    Packs/day: 0.50    Years: 40.00    Types: Cigarettes  . Smokeless tobacco: Never Used     Comment: uses cigarelle- 4 per day  . Alcohol use No     Comment: HISTORY OF DRUG & ETOH 09/19/2004  . Drug use: No     Comment: none since 09/19/2004  . Sexual activity: Not on file   Other Topics Concern  . Not on file   Social History Narrative  . No narrative on file     Family History  Problem Relation Age of Onset  . Diabetes Mother   . Cancer Father      Review of Systems: All other systems reviewed and are otherwise negative except as noted  above.  Physical Exam: Vitals:   10/01/16 0500 10/01/16 0608 10/01/16 0646 10/01/16 0700  BP:  113/67  108/73  Pulse: 63 64  (!) 58  Resp: 15 12  14   Temp:   97.8 F (36.6 C)   TempSrc:   Oral   SpO2: 94% 95%  94%    GEN- The patient is elderly appearing, alert and oriented x 3 today.   HEENT: normocephalic, atraumatic; sclera clear, conjunctiva pink; hearing intact; oropharynx clear; neck supple Lungs- Clear to ausculation bilaterally, normal work of breathing.  No wheezes, rales, rhonchi Heart- Regular rate and rhythm  GI- soft, non-tender, non-distended, bowel sounds present Extremities- no clubbing, cyanosis, or edema  MS- no significant deformity or atrophy Skin- warm and dry, no rash or lesion Psych- euthymic mood, full affect Neuro- strength and sensation are intact  Labs:  Lab Results  Component Value Date   WBC 18.0 (H) 08/05/2016   HGB 14.6 10/01/2016   HCT 43.0 10/01/2016   MCV 92 08/05/2016   PLT 292 08/05/2016    Recent Labs Lab 10/01/16 0454  NA 137  K 4.6  CL 106  CO2 23  BUN 12  CREATININE 0.77  CALCIUM 9.2  GLUCOSE 73      Radiology/Studies: Dg Chest 2 View  Result Date: 09/30/2016 CLINICAL DATA:  Defibrillator fired tonight. Recent fracture of the left humerus. EXAM: CHEST  2 VIEW COMPARISON:  07/19/2016 FINDINGS: Postoperative changes in the mediastinum. Cardiac pacemaker. Normal heart size and pulmonary vascularity. No focal airspace disease or consolidation in the lungs. No blunting of costophrenic angles. No pneumothorax. Mediastinal contours appear intact. 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 Degenerative changes in both shoulders. Thoracolumbar scoliosis with degenerative change. IMPRESSION: No active cardiopulmonary disease. Electronically Signed   By: Lucienne Capers M.D.   On: 09/30/2016 23:57    ZOX:WRUEA rhythm (personally reviewed)  TELEMETRY: sinus rhythm (personally reviewed)  DEVICE HISTORY:  Biotronik dual chamber ICD implanted 2014 for secondary prevention  Assessment/Plan: 1.  VT Pt received appropriate ICD therapy for VT Keep K >3.9, Mg >1.9 No driving x6 months Will not add AAD therapy as this is his first appropriate shock  2.  Noise on RV lead Needs to be replaced With age and need for possible future MRI's, would recommend lead extraction and insertion of new RV lead. Unfortunately, he has 2 dogs at home that he has no one to care for and the  soonest we could do extraction is tomorrow afternoon.  We have discussed extensively risks of leaving the hospital, but he is clear in his decision to go home and come back tomorrow for lead revision. We will call the patient this afternoon with instructions.  He is advised to return to the ER with recurrent shocks. Do not want to adjust sensitivity for fear of undersensing true ventricular arrhythmias. This is a very difficult situation with his home needs. He is aware of risks and would like to be discharged from ER and come back tomorrow.   3.  CAD No recent ischemic symptoms Continue current therapy  4.  HTN Stable No change required today  Dr Lovena Le saw patient this morning and formulated plan of care.    Vallarie Mare 10/01/2016 8:42 AM   EP Attending  Patient seen and examined. I have reviewed the findings as noted above by Chanetta Marshall, NP. The patient has VT, chronic systolic heart failure and presents with more VT and successful ICD therapies. He has been found to have noise on his lead and an elevated shocking impedence. His exam is as noted above. He has a frozen left shoulder. I have recommended he stay in the hospital but he insists on going home to take care of his dogs. He will return on 9/19 to undergo ICD lead removal and insertion of a new ICD lead. I have reviewed the indications, risks/benefits/goals/expectations of the procedure with the patient and he wishes to proceed.  Mikle Bosworth.D.

## 2016-10-02 NOTE — Anesthesia Procedure Notes (Signed)
Procedure Name: Intubation Date/Time: 10/02/2016 4:15 PM Performed by: Everlean Cherry A Pre-anesthesia Checklist: Patient identified, Emergency Drugs available, Suction available and Patient being monitored Patient Re-evaluated:Patient Re-evaluated prior to induction Oxygen Delivery Method: Circle system utilized Preoxygenation: Pre-oxygenation with 100% oxygen Induction Type: IV induction Ventilation: Mask ventilation without difficulty Laryngoscope Size: Miller and 2 Grade View: Grade I Tube type: Oral Tube size: 8.0 mm Number of attempts: 1 Airway Equipment and Method: Stylet Placement Confirmation: ETT inserted through vocal cords under direct vision,  positive ETCO2 and breath sounds checked- equal and bilateral Secured at: 23 cm Tube secured with: Tape Dental Injury: Teeth and Oropharynx as per pre-operative assessment

## 2016-10-02 NOTE — Interval H&P Note (Signed)
History and Physical Interval Note:  10/02/2016 4:25 PM  [name]  has presented today for surgery, with the diagnosis of ICD Lead Fracture  The various methods of treatment have been discussed with the patient and family. After consideration of risks, benefits and other options for treatment, the patient has consented to  Procedure(s) with comments: ICD LEAD REMOVAL (N/A) - Bartle to back up TRANSESOPHAGEAL ECHOCARDIOGRAM (TEE) (N/A) as a surgical intervention .  The patient's history has been reviewed, patient examined, no change in status, stable for surgery.  I have reviewed the patient's chart and labs.  Questions were answered to the patient's satisfaction.     Hector Byrd

## 2016-10-02 NOTE — Transfer of Care (Signed)
Immediate Anesthesia Transfer of Care Note  Patient: Hector Byrd  Procedure(s) Performed: Procedure(s) with comments: ICD LEAD REMOVAL WITH IMPLANTATION OF BIOTRONIK PLEXA PROMRI DF-1 S DX 65/17 SN [id number] (N/A) - Bartle to back up TRANSESOPHAGEAL ECHOCARDIOGRAM (TEE) (N/A)  Patient Location: PACU  Anesthesia Type:General  Level of Consciousness: awake, alert  and oriented  Airway & Oxygen Therapy: Patient Spontanous Breathing and Patient connected to face mask oxygen  Post-op Assessment: Report given to RN and Post -op Vital signs reviewed and stable  Post vital signs: Reviewed and stable  Last Vitals:  Vitals:   10/02/16 1422  BP: (!) 141/80  Pulse: 95  Resp: 18  Temp: 36.8 C  SpO2: 95%    Last Pain:  Vitals:   10/02/16 1425  TempSrc:   PainSc: 0-No pain         Complications: No apparent anesthesia complications

## 2016-10-02 NOTE — Discharge Instructions (Signed)
° ° °  Supplemental Discharge Instructions for  Pacemaker/Defibrillator Patients  Activity No heavy lifting or vigorous activity with your left/right arm for 6 to 8 weeks.  Do not raise your left/right arm above your head for one week.  Gradually raise your affected arm as drawn below.           __   10/06/16                      10/07/16                      10/08/16                    10/09/16  NO DRIVING for 6 months  WOUND CARE - Keep the wound area clean and dry.  Do not get this area wet for one week. No showers for one week; you may shower on   10/09/16  . - The tape/steri-strips on your wound will fall off; do not pull them off.  No bandage is needed on the site.  DO  NOT apply any creams, oils, or ointments to the wound area. - If you notice any drainage or discharge from the wound, any swelling or bruising at the site, or you develop a fever > 101? F after you are discharged home, call the office at once.  Special Instructions - You are still able to use cellular telephones; use the ear opposite the side where you have your pacemaker/defibrillator.  Avoid carrying your cellular phone near your device. - When traveling through airports, show security personnel your identification card to avoid being screened in the metal detectors.  Ask the security personnel to use the hand wand. - Avoid arc welding equipment, MRI testing (magnetic resonance imaging), TENS units (transcutaneous nerve stimulators).  Call the office for questions about other devices. - Avoid electrical appliances that are in poor condition or are not properly grounded. - Microwave ovens are safe to be near or to operate.  Additional information for defibrillator patients should your device go off: - If your device goes off ONCE and you feel fine afterward, notify the device clinic nurses. - If your device goes off ONCE and you do not feel well afterward, call 911. - If your device goes off TWICE, call 911. - If your  device goes off THREE times in one day, call 911.  DO NOT DRIVE YOURSELF OR A FAMILY MEMBER WITH A DEFIBRILLATOR TO THE HOSPITAL--CALL 911.

## 2016-10-02 NOTE — OR Nursing (Signed)
[place] Dr. Lovena Le removed existing implant: Reader DX 65/17, SN [id number], DOI 12/07/2012.

## 2016-10-02 NOTE — Anesthesia Preprocedure Evaluation (Signed)
Anesthesia Evaluation  Patient identified by MRN, date of birth, ID band Patient awake    Reviewed: Allergy & Precautions, NPO status , Patient's Chart, lab work & pertinent test results  History of Anesthesia Complications Negative for: history of anesthetic complications  Airway Mallampati: II  TM Distance: >3 FB Neck ROM: Full    Dental  (+) Poor Dentition, Missing, Chipped   Pulmonary shortness of breath, COPD, Current Smoker,    breath sounds clear to auscultation       Cardiovascular hypertension, + CAD, + Past MI, + CABG, + Peripheral Vascular Disease and +CHF  + Cardiac Defibrillator  Rhythm:Regular     Neuro/Psych  Headaches, PSYCHIATRIC DISORDERS Anxiety Depression    GI/Hepatic Neg liver ROS, GERD  Controlled,  Endo/Other    Renal/GU negative Renal ROS     Musculoskeletal  (+) Arthritis ,   Abdominal   Peds  Hematology negative hematology ROS (+)   Anesthesia Other Findings VT s/p shock yesterday needing lead replacement. EF 15-20%  Reproductive/Obstetrics                             Anesthesia Physical Anesthesia Plan  ASA: III  Anesthesia Plan: General   Post-op Pain Management:    Induction: Intravenous  PONV Risk Score and Plan: 1 and Ondansetron and Dexamethasone  Airway Management Planned:   Additional Equipment: Arterial line and TEE  Intra-op Plan:   Post-operative Plan: Extubation in OR  Informed Consent: I have reviewed the patients History and Physical, chart, labs and discussed the procedure including the risks, benefits and alternatives for the proposed anesthesia with the patient or authorized representative who has indicated his/her understanding and acceptance.   Dental advisory given  Plan Discussed with: CRNA and Surgeon  Anesthesia Plan Comments:         Anesthesia Quick Evaluation

## 2016-10-03 ENCOUNTER — Ambulatory Visit (HOSPITAL_COMMUNITY): Payer: Federal, State, Local not specified - PPO

## 2016-10-03 ENCOUNTER — Encounter (HOSPITAL_COMMUNITY): Payer: Self-pay | Admitting: Internal Medicine

## 2016-10-03 DIAGNOSIS — Z95810 Presence of automatic (implantable) cardiac defibrillator: Secondary | ICD-10-CM

## 2016-10-03 DIAGNOSIS — Z79899 Other long term (current) drug therapy: Secondary | ICD-10-CM | POA: Diagnosis not present

## 2016-10-03 DIAGNOSIS — Z951 Presence of aortocoronary bypass graft: Secondary | ICD-10-CM | POA: Diagnosis not present

## 2016-10-03 DIAGNOSIS — Z7982 Long term (current) use of aspirin: Secondary | ICD-10-CM | POA: Diagnosis not present

## 2016-10-03 DIAGNOSIS — T82110A Breakdown (mechanical) of cardiac electrode, initial encounter: Secondary | ICD-10-CM | POA: Diagnosis not present

## 2016-10-03 DIAGNOSIS — Y718 Miscellaneous cardiovascular devices associated with adverse incidents, not elsewhere classified: Secondary | ICD-10-CM | POA: Diagnosis not present

## 2016-10-03 DIAGNOSIS — I472 Ventricular tachycardia: Secondary | ICD-10-CM | POA: Diagnosis not present

## 2016-10-03 DIAGNOSIS — I255 Ischemic cardiomyopathy: Secondary | ICD-10-CM | POA: Diagnosis not present

## 2016-10-03 DIAGNOSIS — I10 Essential (primary) hypertension: Secondary | ICD-10-CM | POA: Diagnosis not present

## 2016-10-03 LAB — TYPE AND SCREEN
ABO/RH(D): O POS
ANTIBODY SCREEN: NEGATIVE
UNIT DIVISION: 0
Unit division: 0

## 2016-10-03 LAB — BPAM RBC
BLOOD PRODUCT EXPIRATION DATE: 201810162359
Blood Product Expiration Date: 201810162359
UNIT TYPE AND RH: 5100
UNIT TYPE AND RH: 5100

## 2016-10-03 NOTE — Clinical Social Work Note (Signed)
Patient confirmed need for cab voucher. He has no one that can pick him up as all family lives out of state and is unable to pay for the cab out of pocket. CSW confirmed address. Cab voucher filled out and given to RN.  CSW signing off.  Dayton Scrape, [place]

## 2016-10-03 NOTE — Progress Notes (Signed)
Patient given discharge instructions and all questions answered.  

## 2016-10-03 NOTE — Anesthesia Postprocedure Evaluation (Signed)
Anesthesia Post Note  Patient: Hector Byrd  Procedure(s) Performed: Procedure(s) (LRB): ICD LEAD REMOVAL WITH IMPLANTATION OF BIOTRONIK PLEXA PROMRI DF-1 S DX 65/17 SN [id number] (N/A) TRANSESOPHAGEAL ECHOCARDIOGRAM (TEE) (N/A)     Patient location during evaluation: PACU Anesthesia Type: General Level of consciousness: awake and alert Pain management: pain level controlled Vital Signs Assessment: post-procedure vital signs reviewed and stable Respiratory status: spontaneous breathing, nonlabored ventilation, respiratory function stable and patient connected to nasal cannula oxygen Cardiovascular status: blood pressure returned to baseline and stable Postop Assessment: no apparent nausea or vomiting Anesthetic complications: no    Last Vitals:  Vitals:   10/03/16 0522 10/03/16 0742  BP: (!) 101/57 (!) 94/52  Pulse: 66 64  Resp: 16   Temp: 37.1 C   SpO2: 100% 95%    Last Pain:  Vitals:   10/03/16 0522  TempSrc: Oral  PainSc:                  Charlene Cowdrey

## 2016-10-08 ENCOUNTER — Ambulatory Visit (INDEPENDENT_AMBULATORY_CARE_PROVIDER_SITE_OTHER): Payer: Federal, State, Local not specified - PPO | Admitting: *Deleted

## 2016-10-08 DIAGNOSIS — I420 Dilated cardiomyopathy: Secondary | ICD-10-CM

## 2016-10-08 DIAGNOSIS — I255 Ischemic cardiomyopathy: Secondary | ICD-10-CM

## 2016-10-08 NOTE — Progress Notes (Signed)
Remote ICD transmission.   

## 2016-10-10 ENCOUNTER — Encounter: Payer: Self-pay | Admitting: Surgery

## 2016-10-10 NOTE — Progress Notes (Signed)
Cardiothoracic Surgery  I provided surgical backup in the operating room for 45 minutes on 10/02/2016 for ICD lead removal by Dr. Crissie Sickles on [facility], DOB 2055/02/22.

## 2016-10-15 LAB — ECHO INTRAOPERATIVE TEE
AOVTI: 24.6 cm
AV Area VTI index: 1.76 cm2/m2
AV Peak grad: 7 mmHg
AV VEL mean LVOT/AV: 0.67
AV peak Index: 1.88
AV pk vel: 129 cm/s
AV vel: 3.09
AVA: 3.09 cm2
AVAREAMEANV: 3.02 cm2
AVAREAMEANVIN: 1.72 cm2/m2
AVAREAVTI: 3.3 cm2
AVG: 3 mmHg
Ao pk vel: 0.73 m/s
CHL CUP DOP CALC LVOT VTI: 16.8 cm
CHL CUP MV M VEL: 60.1
DOP CAL AO MEAN VELOCITY: 80.6 cm/s
HEIGHTINCHES: 68 in
LVOT MV VTI INDEX: 2.32 cm2/m2
LVOT MV VTI: 4.08
LVOT area: 4.52 cm2
LVOT diameter: 24 mm
LVOT peak VTI: 0.68 cm
LVOTPV: 94.1 cm/s
LVOTSV: 76 mL
MVANNULUSVTI: 18.6 cm
Mean grad: 2 mmHg
P 1/2 time: 391 ms
Valve area index: 1.76
WEIGHTICAEL: 2240 [oz_av]

## 2016-10-16 ENCOUNTER — Ambulatory Visit: Payer: Federal, State, Local not specified - PPO

## 2016-10-17 ENCOUNTER — Encounter: Payer: Self-pay | Admitting: Physician Assistant

## 2016-10-21 DIAGNOSIS — F1120 Opioid dependence, uncomplicated: Secondary | ICD-10-CM | POA: Diagnosis not present

## 2016-11-13 ENCOUNTER — Encounter: Payer: Self-pay | Admitting: Family Medicine

## 2016-11-18 DIAGNOSIS — F1120 Opioid dependence, uncomplicated: Secondary | ICD-10-CM | POA: Diagnosis not present

## 2016-11-22 ENCOUNTER — Telehealth: Payer: Self-pay | Admitting: Internal Medicine

## 2016-11-22 NOTE — Telephone Encounter (Signed)
Hector Byrd is wanting you to know that power in his area has been going out and [name] has a form that needs to be completed and sent back as soon as possible . He will fax over the form . Please call if you have any questions   Thanks

## 2016-11-27 ENCOUNTER — Telehealth: Payer: Self-pay | Admitting: Internal Medicine

## 2016-11-27 NOTE — Telephone Encounter (Signed)
Fax received.  Will return to [name].  No further action needed.

## 2016-11-27 NOTE — Telephone Encounter (Signed)
[employer] Verification Form received via fax needing completed by physician. Placed in Dr.Taylor Doc Box.

## 2016-11-28 NOTE — Telephone Encounter (Signed)
Fax sent.

## 2016-12-20 DIAGNOSIS — F1120 Opioid dependence, uncomplicated: Secondary | ICD-10-CM | POA: Diagnosis not present

## 2016-12-27 ENCOUNTER — Other Ambulatory Visit: Payer: Self-pay | Admitting: Internal Medicine

## 2017-01-01 ENCOUNTER — Telehealth: Payer: Self-pay | Admitting: Internal Medicine

## 2017-01-03 ENCOUNTER — Other Ambulatory Visit: Payer: Self-pay | Admitting: Physician Assistant

## 2017-01-03 ENCOUNTER — Encounter: Payer: Federal, State, Local not specified - PPO | Admitting: Internal Medicine

## 2017-01-05 ENCOUNTER — Encounter: Payer: Self-pay | Admitting: Family Medicine

## 2017-01-06 ENCOUNTER — Encounter: Payer: Federal, State, Local not specified - PPO | Admitting: Internal Medicine

## 2017-01-06 ENCOUNTER — Encounter: Payer: Self-pay | Admitting: Internal Medicine

## 2017-01-08 ENCOUNTER — Encounter: Payer: Self-pay | Admitting: Internal Medicine

## 2017-01-09 NOTE — Telephone Encounter (Signed)
Close  

## 2017-01-29 DIAGNOSIS — F1121 Opioid dependence, in remission: Secondary | ICD-10-CM | POA: Diagnosis not present

## 2017-02-23 NOTE — Progress Notes (Deleted)
Cardiology Office Note Date:  02/23/2017  Patient ID:  Hector Byrd, Hector Byrd [birth year], MRN [id number] PCP:  Shawnee Knapp, MD  Cardiologist:  Dr. Radford Pax Electrophysiologist: Dr. Lovena Le  ***refresh   Chief Complaint: routine EP visit, post lead revision/extractionn/implant  History of Present Illness: Hector Byrd is a 62 y.o. male with history of ICM, HTN, VT w/ICD, CAD (CABG), HLD, kidney mass ***.    He comes in today to be seen for Dr. Lovena Le, he is s/p ICD RV lead failure with extraction and new RV lead implant on 10/02/16.  *** symptoms *** fluid status *** meds, CAD and CM *** site, no post procedure wound check ???? *** kidney mass? *** lipids/labs  Device information: Biotronik single chamber ICD, implanted 12/07/12, RV lead streaction and new lead implant 10/02/16. + hx of appropriate therapy, VT   Past Medical History:  Diagnosis Date  . AICD (automatic cardioverter/defibrillator) present   . Anxiety   . Arthritis   . CAD (coronary artery disease)    a. s/p stent x2 and then CABG 1996.  . Cancer of kidney ([place])    Kidney   . Cardiomyopathy, ischemic    EF 30% by MUGA s/p AICD  . Chronic systolic CHF (congestive heart failure) ([place])   . Depression   . Dyslipidemia   . Dyspnea    with exertion  . Erectile dysfunction   . GERD (gastroesophageal reflux disease)   . Headache   . Hypertension   . Myocardial infarction ([place])    [id number]  . Pneumonia   . Restless legs    not bothered by this anymore  . Rotator cuff tear   . S/P CABG (coronary artery bypass graft)     Past Surgical History:  Procedure Laterality Date  . CARDIAC CATHETERIZATION    . Cardiac stents     prior to 2005- 3 times total  . COLONOSCOPY    . CORONARY ARTERY BYPASS GRAFT  2005  . CORONARY STENT PLACEMENT    . ICD LEAD REMOVAL N/A 10/02/2016   Procedure: ICD LEAD REMOVAL WITH IMPLANTATION OF BIOTRONIK PLEXA PROMRI DF-1 S DX 65/17 SN [id number];  Surgeon: Evans Lance, MD;  Location: [facility]  OR;  Service: Cardiovascular;  Laterality: N/A;  Bartle to back up  . IMPLANTABLE CARDIOVERTER DEFIBRILLATOR IMPLANT  12-07-2012   BTK single chamber ICD implanted by Dr Lovena Le  . IMPLANTABLE CARDIOVERTER DEFIBRILLATOR IMPLANT N/A 12/07/2012   Procedure: IMPLANTABLE CARDIOVERTER DEFIBRILLATOR IMPLANT;  Surgeon: Evans Lance, MD;  Location: [facility] CATH LAB;  Service: Cardiovascular;  Laterality: N/A;  . Kidney resection     small amount of kidney  . ROTATOR CUFF REPAIR    . TEE WITHOUT CARDIOVERSION N/A 10/02/2016   Procedure: TRANSESOPHAGEAL ECHOCARDIOGRAM (TEE);  Surgeon: Evans Lance, MD;  Location: [facility] OR;  Service: Cardiovascular;  Laterality: N/A;    Current Outpatient Medications  Medication Sig Dispense Refill  . aspirin EC 81 MG tablet Take 81 mg by mouth daily.     . buprenorphine-naloxone (SUBOXONE) 8-2 MG SUBL SL tablet Place 0.5-1 tablets under the tongue See admin instructions. Up to 4 times daily 1 tablet in the morning, 1/2 tablet at 10am and 5pm, 1 tablet at 8pm    . clonazePAM (KLONOPIN) 1 MG tablet Take 1-3 mg by mouth 2 (two) times daily. 1 mg by mouth in the morning & 3 mg at bedtime.    . lamoTRIgine (LAMICTAL) 100 MG tablet Take 100 mg by mouth 2 (two)  times daily.     Marland Kitchen lisinopril (PRINIVIL,ZESTRIL) 5 MG tablet Take 1 tablet (5 mg total) by mouth daily. 90 tablet 3  . metoprolol succinate (TOPROL-XL) 25 MG 24 hr tablet TAKE 1/2 TABLET BY MOUTH EVERY DAY 45 tablet 1  . naphazoline-pheniramine (NAPHCON-A) 0.025-0.3 % ophthalmic solution Place 1 drop into both eyes 2 (two) times daily.    . naproxen sodium (ANAPROX) 220 MG tablet Take 220 mg by mouth 2 (two) times daily as needed (pain).     . nitroGLYCERIN (NITROSTAT) 0.4 MG SL tablet Place 0.4 mg under the tongue every 5 (five) minutes as needed for chest pain (MAX 3 TABLETS).     Marland Kitchen omeprazole (PRILOSEC) 20 MG capsule Take 20 mg by mouth daily as needed (heartburn).    . venlafaxine XR (EFFEXOR-XR) 150 MG 24 hr capsule  Take 150 mg by mouth 2 (two) times daily.      No current facility-administered medications for this visit.     Allergies:   Patient has no known allergies.   Social History:  The patient  reports that he has been smoking cigarettes.  He has a 20.00 pack-year smoking history. he has never used smokeless tobacco. He reports that he does not drink alcohol or use drugs.   Family History:  The patient's family history includes Cancer in his father; Diabetes in his mother.  ROS:  Please see the history of present illness.  All other systems are reviewed and otherwise negative.   PHYSICAL EXAM: *** VS:  There were no vitals taken for this visit. BMI: There is no height or weight on file to calculate BMI. Well nourished, well developed, in no acute distress  HEENT: normocephalic, atraumatic  Neck: no JVD, carotid bruits or masses Cardiac:  *** RRR; no significant murmurs, no rubs, or gallops Lungs:  *** CTA b/l, no wheezing, rhonchi or rales  Abd: soft, nontender MS: no deformity or***  atrophy Ext: *** no edema  Skin: warm and dry, no rash Neuro:  No gross deficits appreciated Psych: euthymic mood, full affect  *** ICD site is stable, no tethering or discomfort   EKG:  Not done toda ICD interrogation done today and reviewed by myself:***  07/30/16: Lexiscan stress myoview:  Nuclear stress EF: 31%. There is mid inferior wall aneurysmal defect.  There was no ST segment deviation noted during stress.  Defect 1: There is a large defect of severe severity present in the basal inferior, basal inferolateral, mid inferior, mid inferolateral and apical lateral location.  Findings consistent with prior myocardial infarction.  This is a high risk study based upon large inferior lateral defect with reduced ejection fraction 31%.  Candee Furbish, MD  07/04/16: TTE Study Conclusions - Left ventricle: The cavity size was normal. Wall thickness was   increased in a pattern of mild LVH.  Inferolateral akinesis.   Anterolateral hypokinesis. Basal to mid inferoseptal akinesis.   Basal to mid inferior akinesis. Systolic function was moderately   to severely reduced. The estimated ejection fraction was in the   range of 30% to 35%. Doppler parameters are consistent with   abnormal left ventricular relaxation (grade 1 diastolic   dysfunction). - Aortic valve: There was no stenosis. There was trivial   regurgitation. - Aorta: Mildly dilated aortic root. Aortic root dimension: 37 mm   (ED). - Mitral valve: Mildly calcified annulus. There was no significant   regurgitation. - Right ventricle: The cavity size was normal. Pacer wire or   catheter  noted in right ventricle. Systolic function was mildly   reduced. - Pulmonary arteries: No complete TR doppler jet so unable to   estimate PA systolic pressure. - Inferior vena cava: The vessel was normal in size. The   respirophasic diameter changes were in the normal range (>= 50%),   consistent with normal central venous pressure. Impressions: - Normal LV size with mild LV hypertrophy. Wall motion   abnormalities as noted above. EF 30-35%. Normal RV size with   mildly decreased systolic function. No significant valvular   abnormalities.   Recent Labs: 07/02/2016: TSH 1.930 08/05/2016: ALT 16; Platelets 292 09/30/2016: Magnesium 1.8 10/01/2016: BUN 12; Creatinine, Ser 0.77; Hemoglobin 14.6; Potassium 4.6; Sodium 137  No results found for requested labs within last 8760 hours.   CrCl cannot be calculated (Patient's most recent lab result is older than the maximum 21 days allowed.).   Wt Readings from Last 3 Encounters:  10/03/16 137 lb 2 oz (62.2 kg)  08/05/16 147 lb 6.4 oz (66.9 kg)  07/30/16 153 lb (69.4 kg)     Other studies reviewed: Additional studies/records reviewed today include: summarized above  ASSESSMENT AND PLAN:  1. ICD     ***  2. ICM     ***  3. CAD    ***  4. HTN     ***   Disposition: F/u  with ***  Current medicines are reviewed at length with the patient today.  The patient did not have any concerns regarding medicines.***  Signed, Tommye Standard, PA-C 02/23/2017 6:41 PM     [facility] [address] [place] [address] [phone] (office)  [phone] (fax)

## 2017-02-24 DIAGNOSIS — F3342 Major depressive disorder, recurrent, in full remission: Secondary | ICD-10-CM | POA: Diagnosis not present

## 2017-02-24 DIAGNOSIS — F410 Panic disorder [episodic paroxysmal anxiety] without agoraphobia: Secondary | ICD-10-CM | POA: Diagnosis not present

## 2017-02-25 ENCOUNTER — Encounter: Payer: Federal, State, Local not specified - PPO | Admitting: Physician Assistant

## 2017-02-25 ENCOUNTER — Telehealth: Payer: Self-pay | Admitting: Cardiology

## 2017-02-25 NOTE — Telephone Encounter (Signed)
Patient calling w/ concerns about his device. Was not able to make his appt today b/c his transportation got lost. Informed him that a Device Tech RN will return call her.

## 2017-03-03 DIAGNOSIS — F1121 Opioid dependence, in remission: Secondary | ICD-10-CM | POA: Diagnosis not present

## 2017-03-04 ENCOUNTER — Encounter: Payer: Self-pay | Admitting: Physician Assistant

## 2017-03-14 ENCOUNTER — Ambulatory Visit: Payer: Federal, State, Local not specified - PPO | Admitting: Physician Assistant

## 2017-03-14 VITALS — BP 122/74 | HR 84 | Ht 68.0 in | Wt 137.0 lb

## 2017-03-14 DIAGNOSIS — I2510 Atherosclerotic heart disease of native coronary artery without angina pectoris: Secondary | ICD-10-CM | POA: Diagnosis not present

## 2017-03-14 DIAGNOSIS — Z95810 Presence of automatic (implantable) cardiac defibrillator: Secondary | ICD-10-CM | POA: Diagnosis not present

## 2017-03-14 DIAGNOSIS — Z79899 Other long term (current) drug therapy: Secondary | ICD-10-CM | POA: Diagnosis not present

## 2017-03-14 DIAGNOSIS — I255 Ischemic cardiomyopathy: Secondary | ICD-10-CM

## 2017-03-14 NOTE — Patient Instructions (Addendum)
Medication Instructions:   Your physician recommends that you continue on your current medications as directed. Please refer to the Current Medication list given to you today.    If you need a refill on your cardiac medications before your next appointment, please call your pharmacy.  Labwork: NONE ORDERED  TODAY    Testing/Procedures: NONE ORDERED  TODAY    Follow-Up:  IN 2- 3 MONTHS WITH DR TURNER   Your physician wants you to follow-up in: [place] will receive a reminder letter in the mail two months in advance. If you don't receive a letter, please call our office to schedule the follow-up appointment.  Remote monitoring is used to monitor your Pacemaker of ICD from home. This monitoring reduces the number of office visits required to check your device to one time per year. It allows [place] to keep an eye on the functioning of your device to ensure it is working properly. You are scheduled for a device check from home on . 06-16-17..You may send your transmission at any time that day. If you have a wireless device, the transmission will be sent automatically. After your physician reviews your transmission, you will receive a postcard with your next transmission date.     Any Other Special Instructions Will Be Listed Below (If Applicable).

## 2017-03-14 NOTE — Progress Notes (Signed)
Cardiology Office Note Date:  03/14/2017  Patient ID:  Hector Byrd, Hector Byrd 04/03/1955, MRN [id number] PCP:  Hector Knapp, MD  Cardiologist:  Dr. Radford Pax Electrophysiologist: Dr. Lovena Le    Chief Complaint: routine EP visit, post lead revision/extractionn/implant  History of Present Illness: Hector Byrd is a 62 y.o. male with history of ICM, HTN, VT w/ICD, CAD (CABG), HLD, hx of renal ca treated surgically he states.    He comes in today to be seen for Dr. Lovena Le, he is s/p ICD RV lead failure with extraction and new RV lead implant on 10/02/16.  Cardiac-wise doing well.  He denies any kind of CP, palpitations, no SOB.  He reports good exertional capacity.  He will infrequently have fleeting light headeness when first getting up out of bed in the morning, otherwise no dizziness, near syncope or syncope.  Back in Sept he was having issues with poor balance, falls, though this seems to have resolved since his device revision, new lead.  His biggest issue he states is getting f/u communication with his PMD, is looking for another, c/w R shoulder pain since last year when he tore his rotator cuff, wears sling intermittently 2/2 this, he states he is following with an orthopedic MD.   Device information: Biotronik single chamber ICD, implanted 12/07/12, RV lead extraction and new lead implant 10/02/16. + hx of appropriate therapy, VT   Past Medical History:  Diagnosis Date  . AICD (automatic cardioverter/defibrillator) present   . Anxiety   . Arthritis   . CAD (coronary artery disease)    a. s/p stent x2 and then CABG 1996.  . Cancer of kidney ([place])    Kidney   . Cardiomyopathy, ischemic    EF 30% by MUGA s/p AICD  . Chronic systolic CHF (congestive heart failure) ([place])   . Depression   . Dyslipidemia   . Dyspnea    with exertion  . Erectile dysfunction   . GERD (gastroesophageal reflux disease)   . Headache   . Hypertension   . Myocardial infarction ([place])    [id number]  . Pneumonia   .  Restless legs    not bothered by this anymore  . Rotator cuff tear   . S/P CABG (coronary artery bypass graft)     Past Surgical History:  Procedure Laterality Date  . CARDIAC CATHETERIZATION    . Cardiac stents     prior to 2005- 3 times total  . COLONOSCOPY    . CORONARY ARTERY BYPASS GRAFT  2005  . CORONARY STENT PLACEMENT    . ICD LEAD REMOVAL N/A 10/02/2016   Procedure: ICD LEAD REMOVAL WITH IMPLANTATION OF BIOTRONIK PLEXA PROMRI DF-1 S DX 65/17 SN [id number];  Surgeon: Evans Lance, MD;  Location: [facility] OR;  Service: Cardiovascular;  Laterality: N/A;  Bartle to back up  . IMPLANTABLE CARDIOVERTER DEFIBRILLATOR IMPLANT  12-07-2012   BTK single chamber ICD implanted by Dr Lovena Le  . IMPLANTABLE CARDIOVERTER DEFIBRILLATOR IMPLANT N/A 12/07/2012   Procedure: IMPLANTABLE CARDIOVERTER DEFIBRILLATOR IMPLANT;  Surgeon: Evans Lance, MD;  Location: [facility] CATH LAB;  Service: Cardiovascular;  Laterality: N/A;  . Kidney resection     small amount of kidney  . ROTATOR CUFF REPAIR    . TEE WITHOUT CARDIOVERSION N/A 10/02/2016   Procedure: TRANSESOPHAGEAL ECHOCARDIOGRAM (TEE);  Surgeon: Evans Lance, MD;  Location: [facility] OR;  Service: Cardiovascular;  Laterality: N/A;    Current Outpatient Medications  Medication Sig Dispense Refill  . aspirin EC 81 MG  tablet Take 81 mg by mouth daily.     . buprenorphine-naloxone (SUBOXONE) 8-2 MG SUBL SL tablet Place 0.5-1 tablets under the tongue See admin instructions. Up to 4 times daily 1 tablet in the morning, 1/2 tablet at 10am and 5pm, 1 tablet at 8pm    . clonazePAM (KLONOPIN) 1 MG tablet Take 1-3 mg by mouth 2 (two) times daily. 1 mg by mouth in the morning & 3 mg at bedtime.    . lamoTRIgine (LAMICTAL) 100 MG tablet Take 100 mg by mouth 2 (two) times daily.     . metoprolol succinate (TOPROL-XL) 25 MG 24 hr tablet TAKE 1/2 TABLET BY MOUTH EVERY DAY 45 tablet 1  . naphazoline-pheniramine (NAPHCON-A) 0.025-0.3 % ophthalmic solution Place 1 drop into both  eyes 2 (two) times daily.    . naproxen sodium (ANAPROX) 220 MG tablet Take 220 mg by mouth 2 (two) times daily as needed (pain).     . nitroGLYCERIN (NITROSTAT) 0.4 MG SL tablet Place 0.4 mg under the tongue every 5 (five) minutes as needed for chest pain (MAX 3 TABLETS).     Marland Kitchen omeprazole (PRILOSEC) 20 MG capsule Take 20 mg by mouth daily as needed (heartburn).    . venlafaxine XR (EFFEXOR-XR) 150 MG 24 hr capsule Take 150 mg by mouth 2 (two) times daily.     Marland Kitchen lisinopril (PRINIVIL,ZESTRIL) 5 MG tablet Take 1 tablet (5 mg total) by mouth daily. 90 tablet 3  . lisinopril (PRINIVIL,ZESTRIL) 5 MG tablet Take 5 mg by mouth daily.  3   No current facility-administered medications for this visit.     Allergies:   Patient has no known allergies.   Social History:  The patient  reports that he has been smoking cigarettes.  He has a 20.00 pack-year smoking history. he has never used smokeless tobacco. He reports that he does not drink alcohol or use drugs.   Family History:  The patient's family history includes Cancer in his father; Diabetes in his mother.  ROS:  Please see the history of present illness.  All other systems are reviewed and otherwise negative.   PHYSICAL EXAM:  VS:  BP 122/74   Pulse 84   Ht 5\' 8"  (1.727 m)   Wt 137 lb (62.1 kg)   BMI 20.83 kg/m  BMI: Body mass index is 20.83 kg/m. Well nourished, well developed, though thin body habitus, in no acute distress HEENT: normocephalic, atraumatic  Neck: no JVD, carotid bruits or masses Cardiac:   RRR; no significant murmurs, no rubs, or gallops Lungs:  CTA b/l, no wheezing, rhonchi or rales  Abd: soft, nontender MS: no deformity, no atrophy Ext: no edema, L arm is in a sling   Skin: warm and dry, no rash Neuro:  No gross deficits appreciated Psych: euthymic mood, full affect   ICD site is well healed, stable, no tethering or discomfort   EKG:  Not done today ICD interrogation done today with industry and reviewed by  myself:  Battery and lead measurements are good, no arrhythmias or observations  07/30/16: Lexiscan stress myoview:  Nuclear stress EF: 31%. There is mid inferior wall aneurysmal defect.  There was no ST segment deviation noted during stress.  Defect 1: There is a large defect of severe severity present in the basal inferior, basal inferolateral, mid inferior, mid inferolateral and apical lateral location.  Findings consistent with prior myocardial infarction.  This is a high risk study based upon large inferior lateral defect with reduced  ejection fraction 31%.  Candee Furbish, MD  07/04/16: TTE Study Conclusions - Left ventricle: The cavity size was normal. Wall thickness was   increased in a pattern of mild LVH. Inferolateral akinesis.   Anterolateral hypokinesis. Basal to mid inferoseptal akinesis.   Basal to mid inferior akinesis. Systolic function was moderately   to severely reduced. The estimated ejection fraction was in the   range of 30% to 35%. Doppler parameters are consistent with   abnormal left ventricular relaxation (grade 1 diastolic   dysfunction). - Aortic valve: There was no stenosis. There was trivial   regurgitation. - Aorta: Mildly dilated aortic root. Aortic root dimension: 37 mm   (ED). - Mitral valve: Mildly calcified annulus. There was no significant   regurgitation. - Right ventricle: The cavity size was normal. Pacer wire or   catheter noted in right ventricle. Systolic function was mildly   reduced. - Pulmonary arteries: No complete TR doppler jet so unable to   estimate PA systolic pressure. - Inferior vena cava: The vessel was normal in size. The   respirophasic diameter changes were in the normal range (>= 50%),   consistent with normal central venous pressure. Impressions: - Normal LV size with mild LV hypertrophy. Wall motion   abnormalities as noted above. EF 30-35%. Normal RV size with   mildly decreased systolic function. No significant  valvular   abnormalities.   Recent Labs: 07/02/2016: TSH 1.930 08/05/2016: ALT 16; Platelets 292 09/30/2016: Magnesium 1.8 10/01/2016: BUN 12; Creatinine, Ser 0.77; Hemoglobin 14.6; Potassium 4.6; Sodium 137  No results found for requested labs within last 8760 hours.   CrCl cannot be calculated (Patient's most recent lab result is older than the maximum 21 days allowed.).   Wt Readings from Last 3 Encounters:  03/14/17 137 lb (62.1 kg)  10/03/16 137 lb 2 oz (62.2 kg)  08/05/16 147 lb 6.4 oz (66.9 kg)     Other studies reviewed: Additional studies/records reviewed today include: summarized above  ASSESSMENT AND PLAN:  1. ICD     Intact function, no changes made  2. ICM     No symptoms or exam findings to suggest fluid OL     On BB/ACE     BMET today  3. CAD     No anginal complaints     On ASA, BB     Arrange cardiology f/u to discuss statin therapy and routine f/u  4. HTN     Looks OK, no changes today   Disposition: cardiology f/u in the next couple months.  He was given information for [facility] and also suggested looking at PMD in his plan for a new primary, stressed the importance of this.  Q 3 month remotes and EP f/u in 1 year in-clinic, sooner if needed.  Current medicines are reviewed at length with the patient today.  The patient did not have any concerns regarding medicines.  Venetia Night, PA-C 03/14/2017 4:56 PM     [address] [place] [address] [phone] (office)  [phone] (fax)

## 2017-03-15 LAB — BASIC METABOLIC PANEL
BUN / CREAT RATIO: 16 (ref 10–24)
BUN: 15 mg/dL (ref 8–27)
CO2: 26 mmol/L (ref 20–29)
CREATININE: 0.93 mg/dL (ref 0.76–1.27)
Calcium: 9.7 mg/dL (ref 8.6–10.2)
Chloride: 103 mmol/L (ref 96–106)
GFR calc non Af Amer: 86 mL/min/{1.73_m2} (ref >59)
GFR, EST AFRICAN AMERICAN: 100 mL/min/{1.73_m2} (ref >59)
Glucose: 70 mg/dL (ref 65–99)
Potassium: 5.3 mmol/L — ABNORMAL HIGH (ref 3.5–5.2)
SODIUM: 142 mmol/L (ref 134–144)

## 2017-03-17 ENCOUNTER — Other Ambulatory Visit: Payer: Self-pay | Admitting: Nurse Practitioner

## 2017-03-17 NOTE — Telephone Encounter (Signed)
Pt seen in office on 03-14-17 by Tommye Standard, PA

## 2017-03-18 ENCOUNTER — Encounter: Payer: Self-pay | Admitting: Family Medicine

## 2017-03-18 DIAGNOSIS — J441 Chronic obstructive pulmonary disease with (acute) exacerbation: Secondary | ICD-10-CM

## 2017-03-18 DIAGNOSIS — R9389 Abnormal findings on diagnostic imaging of other specified body structures: Secondary | ICD-10-CM

## 2017-03-18 DIAGNOSIS — R634 Abnormal weight loss: Secondary | ICD-10-CM

## 2017-03-19 ENCOUNTER — Telehealth: Payer: Self-pay | Admitting: *Deleted

## 2017-03-19 NOTE — Telephone Encounter (Signed)
LMOVM TO CALL CLINIC BACK FOR RESULTS FOLLOW UP

## 2017-03-19 NOTE — Telephone Encounter (Signed)
-----   Message from [facility], [place] sent at 03/17/2017  5:59 PM EST ----- Ok for lisinopril, repeat potassium level please in the next day or two.  Thanks renee

## 2017-03-21 ENCOUNTER — Telehealth: Payer: Self-pay | Admitting: *Deleted

## 2017-03-21 NOTE — Telephone Encounter (Signed)
LMOVM TO CALL  BACK  FOR RESULTS AND REPEAT LABS

## 2017-03-27 ENCOUNTER — Encounter: Payer: Self-pay | Admitting: Family Medicine

## 2017-04-02 DIAGNOSIS — F1121 Opioid dependence, in remission: Secondary | ICD-10-CM | POA: Diagnosis not present

## 2017-04-04 ENCOUNTER — Telehealth: Payer: Self-pay | Admitting: Physician Assistant

## 2017-04-04 DIAGNOSIS — I10 Essential (primary) hypertension: Secondary | ICD-10-CM

## 2017-04-04 DIAGNOSIS — Z79899 Other long term (current) drug therapy: Secondary | ICD-10-CM

## 2017-04-04 DIAGNOSIS — E876 Hypokalemia: Secondary | ICD-10-CM

## 2017-04-04 NOTE — Telephone Encounter (Signed)
New Message  Pt c/o medication issue:  1. Name of Medication: lisinopril (PRINIVIL,ZESTRIL) 5 MG tablet  2. How are you currently taking this medication (dosage and times per day)? Take 5 mg by mouth daily.  3. Are you having a reaction (difficulty breathing--STAT)? no  4. What is your medication issue? Pt states that he was told to stop taking his lisinopril due to potassium levels. Says its been a week and he still hasn't heard any further instructions. Please call

## 2017-04-04 NOTE — Telephone Encounter (Signed)
Notes recorded by [place], PA-C on 03/17/2017 at 5:59 PM EST Ok for lisinopril, repeat potassium level please in the next day or two.  Thanks [employer] with patient who is asking about when he should restart Lisinopril.  Advised this office has been attempting to contact him since 3/4 leaving messages to c/b to discuss and schedule repeat lab work.  Pt reports he has not received the messages.  He has the medication however does not drive and can not come into the office until 4/2 for blood work.  Instructed pt he could also have blood work at his PCP office if it is closer to his home.  He would like to have it drawn here.  Order placed and appt scheduled for 04/15/17.

## 2017-04-10 ENCOUNTER — Telehealth: Payer: Self-pay | Admitting: Family Medicine

## 2017-04-10 NOTE — Telephone Encounter (Signed)
Copied from [place] [id number]. Topic: Quick Communication - See Telephone Encounter >> Apr 10, 2017  4:56 PM Cleaster Corin, NT wrote: CRM for notification. See Telephone encounter for: 04/10/17.  Pt. Calling Dr. Brigitte Pulse  to receive results see my chart message 03/27/17  pt. Cb: [phone]

## 2017-04-11 NOTE — Telephone Encounter (Signed)
Please advise 

## 2017-04-13 NOTE — Telephone Encounter (Signed)
Responded to most recent email from 3/14. Forwarded email to [employer] as Juluis Rainier

## 2017-04-15 ENCOUNTER — Other Ambulatory Visit: Payer: Federal, State, Local not specified - PPO

## 2017-04-18 ENCOUNTER — Other Ambulatory Visit: Payer: Federal, State, Local not specified - PPO | Admitting: *Deleted

## 2017-04-18 DIAGNOSIS — E876 Hypokalemia: Secondary | ICD-10-CM

## 2017-04-18 DIAGNOSIS — Z79899 Other long term (current) drug therapy: Secondary | ICD-10-CM

## 2017-04-18 DIAGNOSIS — I10 Essential (primary) hypertension: Secondary | ICD-10-CM | POA: Diagnosis not present

## 2017-04-19 LAB — BASIC METABOLIC PANEL WITH GFR
BUN/Creatinine Ratio: 13 (ref 10–24)
BUN: 12 mg/dL (ref 8–27)
CO2: 26 mmol/L (ref 20–29)
Calcium: 9.7 mg/dL (ref 8.6–10.2)
Chloride: 104 mmol/L (ref 96–106)
Creatinine, Ser: 0.9 mg/dL (ref 0.76–1.27)
GFR calc Af Amer: 104 mL/min/1.73
GFR calc non Af Amer: 90 mL/min/1.73
Glucose: 89 mg/dL (ref 65–99)
Potassium: 4.7 mmol/L (ref 3.5–5.2)
Sodium: 145 mmol/L — ABNORMAL HIGH (ref 134–144)

## 2017-04-30 DIAGNOSIS — F1121 Opioid dependence, in remission: Secondary | ICD-10-CM | POA: Diagnosis not present

## 2017-05-23 DIAGNOSIS — F1121 Opioid dependence, in remission: Secondary | ICD-10-CM | POA: Diagnosis not present

## 2017-05-26 ENCOUNTER — Institutional Professional Consult (permissible substitution): Payer: Federal, State, Local not specified - PPO | Admitting: Pulmonary Disease

## 2017-05-30 ENCOUNTER — Institutional Professional Consult (permissible substitution): Payer: Federal, State, Local not specified - PPO | Admitting: Pulmonary Disease

## 2017-06-03 ENCOUNTER — Ambulatory Visit: Payer: Federal, State, Local not specified - PPO | Admitting: Cardiology

## 2017-06-16 ENCOUNTER — Ambulatory Visit (INDEPENDENT_AMBULATORY_CARE_PROVIDER_SITE_OTHER): Payer: Federal, State, Local not specified - PPO | Admitting: *Deleted

## 2017-06-16 DIAGNOSIS — R55 Syncope and collapse: Secondary | ICD-10-CM

## 2017-06-16 DIAGNOSIS — I255 Ischemic cardiomyopathy: Secondary | ICD-10-CM

## 2017-06-16 NOTE — Progress Notes (Signed)
Remote ICD transmission.   

## 2017-06-19 ENCOUNTER — Encounter: Payer: Self-pay | Admitting: Family Medicine

## 2017-06-20 DIAGNOSIS — F1121 Opioid dependence, in remission: Secondary | ICD-10-CM | POA: Diagnosis not present

## 2017-06-27 ENCOUNTER — Other Ambulatory Visit: Payer: Self-pay | Admitting: Physician Assistant

## 2017-06-30 ENCOUNTER — Institutional Professional Consult (permissible substitution): Payer: Federal, State, Local not specified - PPO | Admitting: Pulmonary Disease

## 2017-07-04 LAB — CUP PACEART REMOTE DEVICE CHECK
Implantable Lead Location: 753860
Implantable Lead Model: 365501
Implantable Pulse Generator Implant Date: 20141124
Lead Channel Setting Pacing Amplitude: 2.5 V
Lead Channel Setting Sensing Sensitivity: 0.8 mV
MDC IDC LEAD IMPLANT DT: 20141124
MDC IDC LEAD SERIAL: 10552343
MDC IDC PG SERIAL: 60740583
MDC IDC SESS DTM: 20190621150640
MDC IDC SET LEADCHNL RV PACING PULSEWIDTH: 0.4 ms

## 2017-07-07 ENCOUNTER — Encounter: Payer: Self-pay | Admitting: Cardiology

## 2017-07-07 ENCOUNTER — Ambulatory Visit: Payer: Federal, State, Local not specified - PPO | Admitting: Cardiology

## 2017-07-07 ENCOUNTER — Institutional Professional Consult (permissible substitution): Payer: Federal, State, Local not specified - PPO | Admitting: Internal Medicine

## 2017-07-07 VITALS — BP 110/68 | Ht 68.0 in | Wt 140.1 lb

## 2017-07-07 DIAGNOSIS — I472 Ventricular tachycardia, unspecified: Secondary | ICD-10-CM

## 2017-07-07 DIAGNOSIS — I712 Thoracic aortic aneurysm, without rupture, unspecified: Secondary | ICD-10-CM

## 2017-07-07 DIAGNOSIS — I77819 Aortic ectasia, unspecified site: Secondary | ICD-10-CM | POA: Diagnosis not present

## 2017-07-07 DIAGNOSIS — I10 Essential (primary) hypertension: Secondary | ICD-10-CM | POA: Diagnosis not present

## 2017-07-07 DIAGNOSIS — E785 Hyperlipidemia, unspecified: Secondary | ICD-10-CM | POA: Diagnosis not present

## 2017-07-07 DIAGNOSIS — I5042 Chronic combined systolic (congestive) and diastolic (congestive) heart failure: Secondary | ICD-10-CM

## 2017-07-07 DIAGNOSIS — I255 Ischemic cardiomyopathy: Secondary | ICD-10-CM | POA: Diagnosis not present

## 2017-07-07 DIAGNOSIS — I2510 Atherosclerotic heart disease of native coronary artery without angina pectoris: Secondary | ICD-10-CM

## 2017-07-07 HISTORY — DX: Chronic combined systolic (congestive) and diastolic (congestive) heart failure: I50.42

## 2017-07-07 NOTE — Progress Notes (Signed)
Cardiology Office Note:    Date:  07/07/2017   ID:  Hector Byrd, DOB 04-19-1955, MRN [id number]  PCP:  Shawnee Knapp, MD  Cardiologist:  No primary care provider on file.    Referring MD: Shawnee Knapp, MD   Chief Complaint  Patient presents with  . Coronary Artery Disease  . Cardiomyopathy  . Hyperlipidemia  . Congestive Heart Failure    History of Present Illness:    Hector Byrd is a 62 y.o. 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  He has not been seen by me since 2015 but did see Sharrell Ku, NP year ago for preoperative cardiac clearance.  He is followed by Dr. Lovena Le for his ICD.  Echo in 2014 showed EF 30 to 35% and last cath 2005 showed patent RIMA-PDA, occluded radial graft to the left system.  At the time of his last office visit he had had 2 syncopal episodes along with dizziness.  He injured his shoulder during 1 of these with a fracture of the left humeral neck.  Was felt that he had orthostatic hypotension that resulted in his syncope.  He  lisinopril was reduced.  It was felt he was not a candidate for Entresto or Aldactone due to soft BP.  Repeat 2D echocardiogram showed no change in LV function with EF 30 to 35% and grade 1 diastolic dysfunction.  There was no aortic stenosis.  He is here today for followup and is doing fairly well.  He denies any anginal chest pain or pressure.  He does have chronic DOE which he thinks is unchanged.  He denies any PND, orthopnea,  dizziness, palpitations or syncope.He occasionally has some LE edema.   He is compliant with his meds and is tolerating meds with no SE.    Past Medical History:  Diagnosis Date  . AICD (automatic cardioverter/defibrillator) present   . Anxiety   . Arthritis   . CAD  (coronary artery disease)    a. s/p stent x2 and then CABG 1996.  . Cancer of kidney ([place])    Kidney   . Cardiomyopathy, ischemic    EF 30% by MUGA s/p AICD  . Chronic combined systolic (congestive) and diastolic (congestive) heart failure ([place]) 07/07/2017  . Chronic systolic CHF (congestive heart failure) ([place])   . Depression   . Dyslipidemia   . Dyspnea    with exertion  . Erectile dysfunction   . GERD (gastroesophageal reflux disease)   . Headache   . Hypertension   . Myocardial infarction ([place])    [id number]  . Pneumonia   . Restless legs    not bothered by this anymore  . Rotator cuff tear   . S/P CABG (coronary artery bypass graft)     Past Surgical History:  Procedure Laterality Date  . CARDIAC CATHETERIZATION    . Cardiac stents     prior to 2005- 3 times total  . COLONOSCOPY    . CORONARY ARTERY BYPASS GRAFT  2005  . CORONARY STENT PLACEMENT    . ICD LEAD REMOVAL N/A 10/02/2016   Procedure: ICD LEAD REMOVAL WITH IMPLANTATION OF BIOTRONIK PLEXA PROMRI DF-1 S DX 65/17 SN [id number];  Surgeon: Evans Lance, MD;  Location: [facility];  Service: Cardiovascular;  Laterality: N/A;  Bartle to back up  . IMPLANTABLE CARDIOVERTER DEFIBRILLATOR IMPLANT  12-07-2012   BTK single chamber ICD implanted by Dr Lovena Le  . IMPLANTABLE CARDIOVERTER DEFIBRILLATOR IMPLANT N/A 12/07/2012   Procedure: IMPLANTABLE CARDIOVERTER DEFIBRILLATOR IMPLANT;  Surgeon: Evans Lance, MD;  Location: [facility] CATH LAB;  Service: Cardiovascular;  Laterality: N/A;  . Kidney resection     small amount of kidney  . ROTATOR CUFF REPAIR    . TEE WITHOUT CARDIOVERSION N/A 10/02/2016   Procedure: TRANSESOPHAGEAL ECHOCARDIOGRAM (TEE);  Surgeon: Evans Lance, MD;  Location[facility];  Service: Cardiovascular;  Laterality: N/A;    Current Medications: Current Meds  Medication Sig  . aspirin EC 81 MG tablet Take 81 mg by mouth daily.   . buprenorphine-naloxone (SUBOXONE) 8-2 MG SUBL SL tablet Place 0.5-1 tablets under the  tongue See admin instructions. Up to 4 times daily 1 tablet in the morning, 1/2 tablet at 10am and 5pm, 1 tablet at 8pm  . clonazePAM (KLONOPIN) 1 MG tablet Take 1-3 mg by mouth 2 (two) times daily. 1 mg by mouth in the morning & 3 mg at bedtime.  . lamoTRIgine (LAMICTAL) 100 MG tablet Take 100 mg by mouth 2 (two) times daily.   Marland Kitchen lisinopril (PRINIVIL,ZESTRIL) 5 MG tablet TAKE 1 TABLET BY MOUTH EVERY DAY  . metoprolol succinate (TOPROL-XL) 25 MG 24 hr tablet TAKE 1/2 TABLET BY MOUTH EVERY DAY  . naphazoline-pheniramine (NAPHCON-A) 0.025-0.3 % ophthalmic solution Place 1 drop into both eyes 2 (two) times daily.  . naproxen sodium (ANAPROX) 220 MG tablet Take 220 mg by mouth 2 (two) times daily as needed (pain).   . nitroGLYCERIN (NITROSTAT) 0.4 MG SL tablet Place 0.4 mg under the tongue every 5 (five) minutes as needed for chest pain (MAX 3 TABLETS).   Marland Kitchen omeprazole (PRILOSEC) 20 MG capsule Take 20 mg by mouth daily as needed (heartburn).  . venlafaxine XR (EFFEXOR-XR) 150 MG 24 hr capsule Take 150 mg by mouth 2 (two) times daily.      Allergies:   Patient has no known allergies.   Social History   Socioeconomic History  . Marital status: Single    Spouse name: Not on file  . Number of children: Not on file  . Years of education: Not on file  . Highest education level: Not on file  Occupational History  . Not on file  Social Needs  . Financial resource strain: Not on file  . Food insecurity:    Worry: Not on file    Inability: Not on file  . Transportation needs:    Medical: Not on file    Non-medical: Not on file  Tobacco Use  . Smoking status: Current Every Day Smoker    Packs/day: 0.50    Years: 40.00    Pack years: 20.00    Types: Cigarettes  . Smokeless tobacco: Never Used  . Tobacco comment: uses cigarelle- 4 per day  Substance and Sexual Activity  . Alcohol use: No    Comment: HISTORY OF DRUG & ETOH 09/19/2004  . Drug use: No    Comment: none since 09/19/2004  . Sexual  activity: Never  Lifestyle  . Physical activity:    Days per week: Not on file    Minutes per session: Not on file  . Stress: Not on file  Relationships  . Social connections:    Talks on phone: Not on file    Gets together: Not on file    Attends  religious service: Not on file    Active member of club or organization: Not on file    Attends meetings of clubs or organizations: Not on file    Relationship status: Not on file  Other Topics Concern  . Not on file  Social History Narrative  . Not on file     Family History: The patient's family history includes Cancer in his father; Diabetes in his mother.  ROS:   Please see the history of present illness.    ROS  All other systems reviewed and negative.   EKGs/Labs/Other Studies Reviewed:    The following studies were reviewed today: Office notes  EKG:  EKG is  ordered today.  The ekg ordered today demonstrates NSR at 86bpm with inferior infarct and no ST changes, QTc 456msec  Recent Labs: 08/05/2016: ALT 16; Platelets 292 09/30/2016: Magnesium 1.8 10/01/2016: Hemoglobin 14.6 04/18/2017: BUN 12; Creatinine, Ser 0.90; Potassium 4.7; Sodium 145   Recent Lipid Panel    Component Value Date/Time   CHOL 108 10/21/2013 1307   TRIG 179.0 (H) 10/21/2013 1307   HDL 20.10 (L) 10/21/2013 1307   CHOLHDL 5 10/21/2013 1307   VLDL 35.8 10/21/2013 1307   LDLCALC 52 10/21/2013 1307    Physical Exam:    VS:  BP 110/68   Ht 5\' 8"  (1.727 m)   Wt 140 lb 1.9 oz (63.6 kg)   BMI 21.31 kg/m     Wt Readings from Last 3 Encounters:  07/07/17 140 lb 1.9 oz (63.6 kg)  03/14/17 137 lb (62.1 kg)  10/03/16 137 lb 2 oz (62.2 kg)     GEN:  Well nourished, well developed in no acute distress HEENT: Normal NECK: No JVD; No carotid bruits LYMPHATICS: No lymphadenopathy CARDIAC: RRR, no murmurs, rubs, gallops RESPIRATORY:  Clear to auscultation without rales, wheezing or rhonchi  ABDOMEN: Soft, non-tender, non-distended MUSCULOSKELETAL:   No edema; No deformity  SKIN: Warm and dry NEUROLOGIC:  Alert and oriented x 3 PSYCHIATRIC:  Normal affect   ASSESSMENT:    1. Coronary artery disease involving native coronary artery of native heart without angina pectoris   2. Cardiomyopathy, ischemic   3. Essential hypertension   4. Thoracic aortic aneurysm without rupture ([place])   5. Ventricular tachycardia ([place])   6. Chronic combined systolic (congestive) and diastolic (congestive) heart failure ([place])   7. Dyslipidemia   8. Dilation of aorta ([facility])    PLAN:    In order of problems listed above:  1.  ASCAD - status post CABG in 1996.  Last cath 2005 showed patent RIMA-PDA, occluded radial graft to the left system.  He denies any anginal sx. He will continue on ASA 81 mg daily, beta-blocker.  2.  Ischemic DCM -echo 06/2016 showed persistence of moderately reduced LV function EF 30 to 35% with grade 1 diastolic dysfunction.  He is status post AICD.  3.  HTN - BP is well controlled on exam today.  He will continue on lisinopril 5 mg daily and Toprol-XL 25 mg 1/2 tablet daily.  4.  Thoracic aortic aneurysm -chest CT 08/2016 showed 4 cm ascending thoracic aortic aneurysm.  I will repeat CT to make sure this is stable.  5.  VT s/p AICD - followed in device clinic  6.  Chronic combined systolic/diastolic CHF -he appears euvolemic on exam today.  He will continue on beta-blocker and low-dose ACE inhibitor.  Not a candidate for Entresto or Aldactone due to history of soft blood pressure with  syncope in the past due to orthostasis.  Creatinine was stable at 0.9 and potassium 4.7 on 04/18/2017.  7.  Hyperlipidemia -LDL goal is less than 70.  I do not have a recent FLP so I will get this from his PCP.   Medication Adjustments/Labs and Tests Ordered: Current medicines are reviewed at length with the patient today.  Concerns regarding medicines are outlined above.  Orders Placed This Encounter  Procedures  . CT ANGIO CHEST AORTA W &/OR WO  CONTRAST  . Basic metabolic panel  . Lipid panel  . Hepatic function panel  . EKG 12-Lead   No orders of the defined types were placed in this encounter.   Signed, Fransico Him, MD  07/07/2017 10:44 PM    [place]

## 2017-07-07 NOTE — Patient Instructions (Signed)
Medication Instructions:  Your physician recommends that you continue on your current medications as directed. Please refer to the Current Medication list given to you today.  If you need a refill on your cardiac medications, please contact your pharmacy first.  Labwork: Your physician recommends that you return for lab work in: 1-2 weeks for kidney function test, fasting lipid panel and liver function test    Testing/Procedures: Non-Cardiac CT Angiography (CTA), is a special type of CT scan that uses a computer to produce multi-dimensional views of major blood vessels throughout the body. In CT angiography, a contrast material is injected through an IV to help visualize the blood vessels  Follow-Up: Your physician wants you to follow-up in: 6 months with Dr. Radford Pax. You will receive a reminder letter in the mail two months in advance. If you don't receive a letter, please call our office to schedule the follow-up appointment.  Any Other Special Instructions Will Be Listed Below (If Applicable).   Thank you for choosing [facility], RN  [phone]  If you need a refill on your cardiac medications before your next appointment, please call your pharmacy.

## 2017-07-08 ENCOUNTER — Institutional Professional Consult (permissible substitution): Payer: Federal, State, Local not specified - PPO | Admitting: Pulmonary Disease

## 2017-07-16 ENCOUNTER — Encounter: Payer: Self-pay | Admitting: Cardiology

## 2017-07-21 ENCOUNTER — Institutional Professional Consult (permissible substitution): Payer: Federal, State, Local not specified - PPO | Admitting: Pulmonary Disease

## 2017-07-21 DIAGNOSIS — F1121 Opioid dependence, in remission: Secondary | ICD-10-CM | POA: Diagnosis not present

## 2017-07-22 ENCOUNTER — Other Ambulatory Visit: Payer: Federal, State, Local not specified - PPO

## 2017-07-23 ENCOUNTER — Other Ambulatory Visit: Payer: Federal, State, Local not specified - PPO

## 2017-07-23 ENCOUNTER — Encounter: Payer: Self-pay | Admitting: Cardiology

## 2017-08-18 DIAGNOSIS — F3342 Major depressive disorder, recurrent, in full remission: Secondary | ICD-10-CM | POA: Diagnosis not present

## 2017-08-18 DIAGNOSIS — F1210 Cannabis abuse, uncomplicated: Secondary | ICD-10-CM | POA: Diagnosis not present

## 2017-08-18 DIAGNOSIS — F410 Panic disorder [episodic paroxysmal anxiety] without agoraphobia: Secondary | ICD-10-CM | POA: Diagnosis not present

## 2017-08-25 ENCOUNTER — Inpatient Hospital Stay: Admission: RE | Admit: 2017-08-25 | Payer: Federal, State, Local not specified - PPO | Source: Ambulatory Visit

## 2017-08-25 DIAGNOSIS — F1121 Opioid dependence, in remission: Secondary | ICD-10-CM | POA: Diagnosis not present

## 2017-09-16 ENCOUNTER — Ambulatory Visit (INDEPENDENT_AMBULATORY_CARE_PROVIDER_SITE_OTHER): Payer: Federal, State, Local not specified - PPO | Admitting: *Deleted

## 2017-09-16 DIAGNOSIS — I255 Ischemic cardiomyopathy: Secondary | ICD-10-CM

## 2017-09-16 NOTE — Progress Notes (Signed)
Remote ICD transmission.   

## 2017-09-17 ENCOUNTER — Other Ambulatory Visit: Payer: Self-pay | Admitting: *Deleted

## 2017-09-17 DIAGNOSIS — I255 Ischemic cardiomyopathy: Secondary | ICD-10-CM

## 2017-09-18 ENCOUNTER — Other Ambulatory Visit: Payer: Federal, State, Local not specified - PPO

## 2017-09-18 ENCOUNTER — Inpatient Hospital Stay: Admission: RE | Admit: 2017-09-18 | Payer: Federal, State, Local not specified - PPO | Source: Ambulatory Visit

## 2017-09-22 ENCOUNTER — Encounter: Payer: Self-pay | Admitting: Family Medicine

## 2017-09-23 ENCOUNTER — Encounter: Payer: Self-pay | Admitting: Family Medicine

## 2017-10-02 ENCOUNTER — Ambulatory Visit (INDEPENDENT_AMBULATORY_CARE_PROVIDER_SITE_OTHER)
Admission: RE | Admit: 2017-10-02 | Discharge: 2017-10-02 | Disposition: A | Discharge status: Home / Self Care | Payer: Federal, State, Local not specified - PPO | Source: Ambulatory Visit | Attending: Cardiology | Admitting: Cardiology

## 2017-10-02 ENCOUNTER — Other Ambulatory Visit: Payer: Federal, State, Local not specified - PPO | Admitting: *Deleted

## 2017-10-02 DIAGNOSIS — I712 Thoracic aortic aneurysm, without rupture: Secondary | ICD-10-CM | POA: Diagnosis not present

## 2017-10-02 DIAGNOSIS — E785 Hyperlipidemia, unspecified: Secondary | ICD-10-CM | POA: Diagnosis not present

## 2017-10-02 DIAGNOSIS — I77819 Aortic ectasia, unspecified site: Secondary | ICD-10-CM | POA: Diagnosis not present

## 2017-10-02 DIAGNOSIS — I255 Ischemic cardiomyopathy: Secondary | ICD-10-CM

## 2017-10-02 LAB — BASIC METABOLIC PANEL
BUN / CREAT RATIO: 14 (ref 10–24)
BUN: 12 mg/dL (ref 8–27)
CO2: 25 mmol/L (ref 20–29)
CREATININE: 0.88 mg/dL (ref 0.76–1.27)
Calcium: 10.1 mg/dL (ref 8.6–10.2)
Chloride: 106 mmol/L (ref 96–106)
GFR calc non Af Amer: 91 mL/min/{1.73_m2} (ref >59)
GFR, EST AFRICAN AMERICAN: 105 mL/min/{1.73_m2} (ref >59)
GLUCOSE: 125 mg/dL — AB (ref 65–99)
Potassium: 4.7 mmol/L (ref 3.5–5.2)
SODIUM: 142 mmol/L (ref 134–144)

## 2017-10-02 LAB — HEPATIC FUNCTION PANEL
ALK PHOS: 180 IU/L — AB (ref 39–117)
ALT: 14 IU/L (ref 0–44)
AST: 19 IU/L (ref 0–40)
Albumin: 4.3 g/dL (ref 3.6–4.8)
BILIRUBIN TOTAL: 0.2 mg/dL (ref 0.0–1.2)
Bilirubin, Direct: 0.09 mg/dL (ref 0.00–0.40)
TOTAL PROTEIN: 6.8 g/dL (ref 6.0–8.5)

## 2017-10-02 LAB — LIPID PANEL
Chol/HDL Ratio: 5.8 ratio — ABNORMAL HIGH (ref 0.0–5.0)
Cholesterol, Total: 180 mg/dL (ref 100–199)
HDL: 31 mg/dL — AB (ref >39)
LDL Calculated: 110 mg/dL — ABNORMAL HIGH (ref 0–99)
TRIGLYCERIDES: 194 mg/dL — AB (ref 0–149)
VLDL Cholesterol Cal: 39 mg/dL (ref 5–40)

## 2017-10-02 MED ORDER — IOPAMIDOL (ISOVUE-370) INJECTION 76%
100.0000 mL | Freq: Once | INTRAVENOUS | Status: AC | PRN
  Administered 2017-10-02: 100 mL via INTRAVENOUS

## 2017-10-03 ENCOUNTER — Telehealth: Payer: Self-pay

## 2017-10-03 MED ORDER — ATORVASTATIN CALCIUM 40 MG PO TABS: 40.0000 mg | ORAL_TABLET | Freq: Every day | ORAL | 3 refills | Status: DC

## 2017-10-03 NOTE — Telephone Encounter (Signed)
-----   Message from Sarina Ill, RN sent at 10/03/2017  8:11 AM EDT -----   ----- Message ----- From: Dorothy Spark, MD Sent: 10/03/2017   7:08 AM EDT To: Cv Div [facility] Triage  High LDL, increased from prior, the patient is post CABG and not on statin, Legrand Como can you find out why and start on atorvastatin 40 mg po daily?

## 2017-10-03 NOTE — Telephone Encounter (Signed)
Spoke with the patient regarding his lab results he expressed understanding and accepted taking atorvastatin 40 mg daily. He stated he is eating better than he was because he is getting "meals on wheels" that delivers a fresh meal daily to his home. He was on a statin and he just stopped taking it.

## 2017-10-07 LAB — CUP PACEART REMOTE DEVICE CHECK
Implantable Lead Location: 753860
Implantable Lead Serial Number: 10552343
MDC IDC LEAD IMPLANT DT: 20141124
MDC IDC LEAD MODEL: 365501
MDC IDC PG IMPLANT DT: 20141124
MDC IDC SESS DTM: 20190924080132
Pulse Gen Model: 383594
Pulse Gen Serial Number: 60740583

## 2017-11-24 ENCOUNTER — Encounter: Payer: Self-pay | Admitting: Cardiology

## 2017-12-15 ENCOUNTER — Encounter: Payer: Self-pay | Admitting: Cardiology

## 2017-12-15 ENCOUNTER — Ambulatory Visit: Payer: Federal, State, Local not specified - PPO | Admitting: Cardiology

## 2017-12-15 DIAGNOSIS — R0989 Other specified symptoms and signs involving the circulatory and respiratory systems: Secondary | ICD-10-CM

## 2017-12-16 ENCOUNTER — Ambulatory Visit (INDEPENDENT_AMBULATORY_CARE_PROVIDER_SITE_OTHER): Payer: Federal, State, Local not specified - PPO

## 2017-12-16 DIAGNOSIS — I255 Ischemic cardiomyopathy: Secondary | ICD-10-CM | POA: Diagnosis not present

## 2017-12-16 NOTE — Progress Notes (Signed)
Remote ICD transmission.   

## 2017-12-23 ENCOUNTER — Encounter: Payer: Self-pay | Admitting: Cardiology

## 2018-02-05 LAB — CUP PACEART REMOTE DEVICE CHECK
Implantable Lead Implant Date: 20141124
Implantable Lead Location: 753860
MDC IDC LEAD MODEL: 365501
MDC IDC LEAD SERIAL: 10552343
MDC IDC PG IMPLANT DT: 20141124
MDC IDC SESS DTM: 20200123172035
Pulse Gen Model: 383594
Pulse Gen Serial Number: 60740583

## 2018-03-08 IMAGING — CR DG CHEST 2V
2 series · 2 of 2 positions shown · non-contrast
Comparison: Chest radiograph performed 12/08/2012

CLINICAL DATA: Acute onset of shortness of breath. Initial
encounter.

EXAM:
CHEST  2 VIEW

[chest lat]
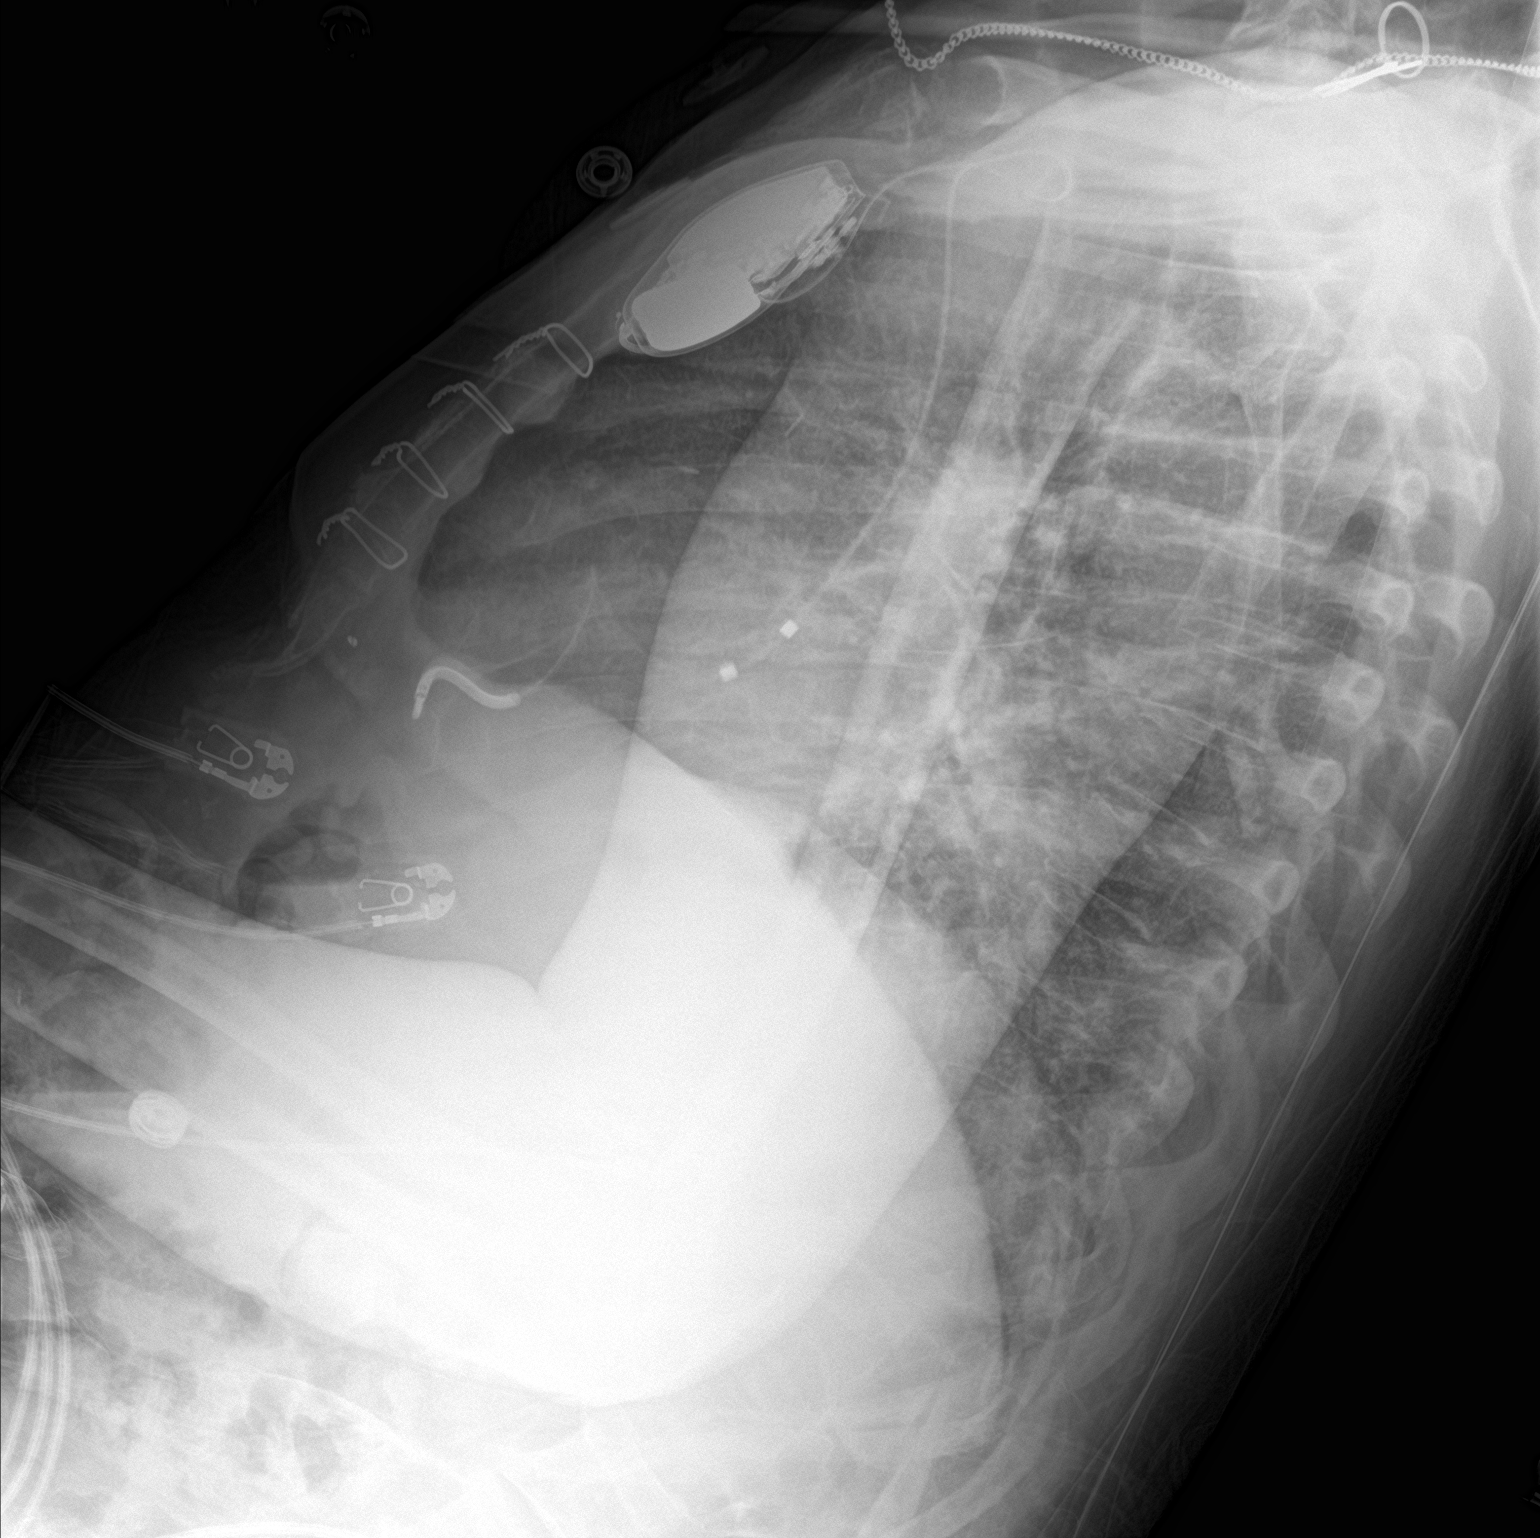

[chest ap]
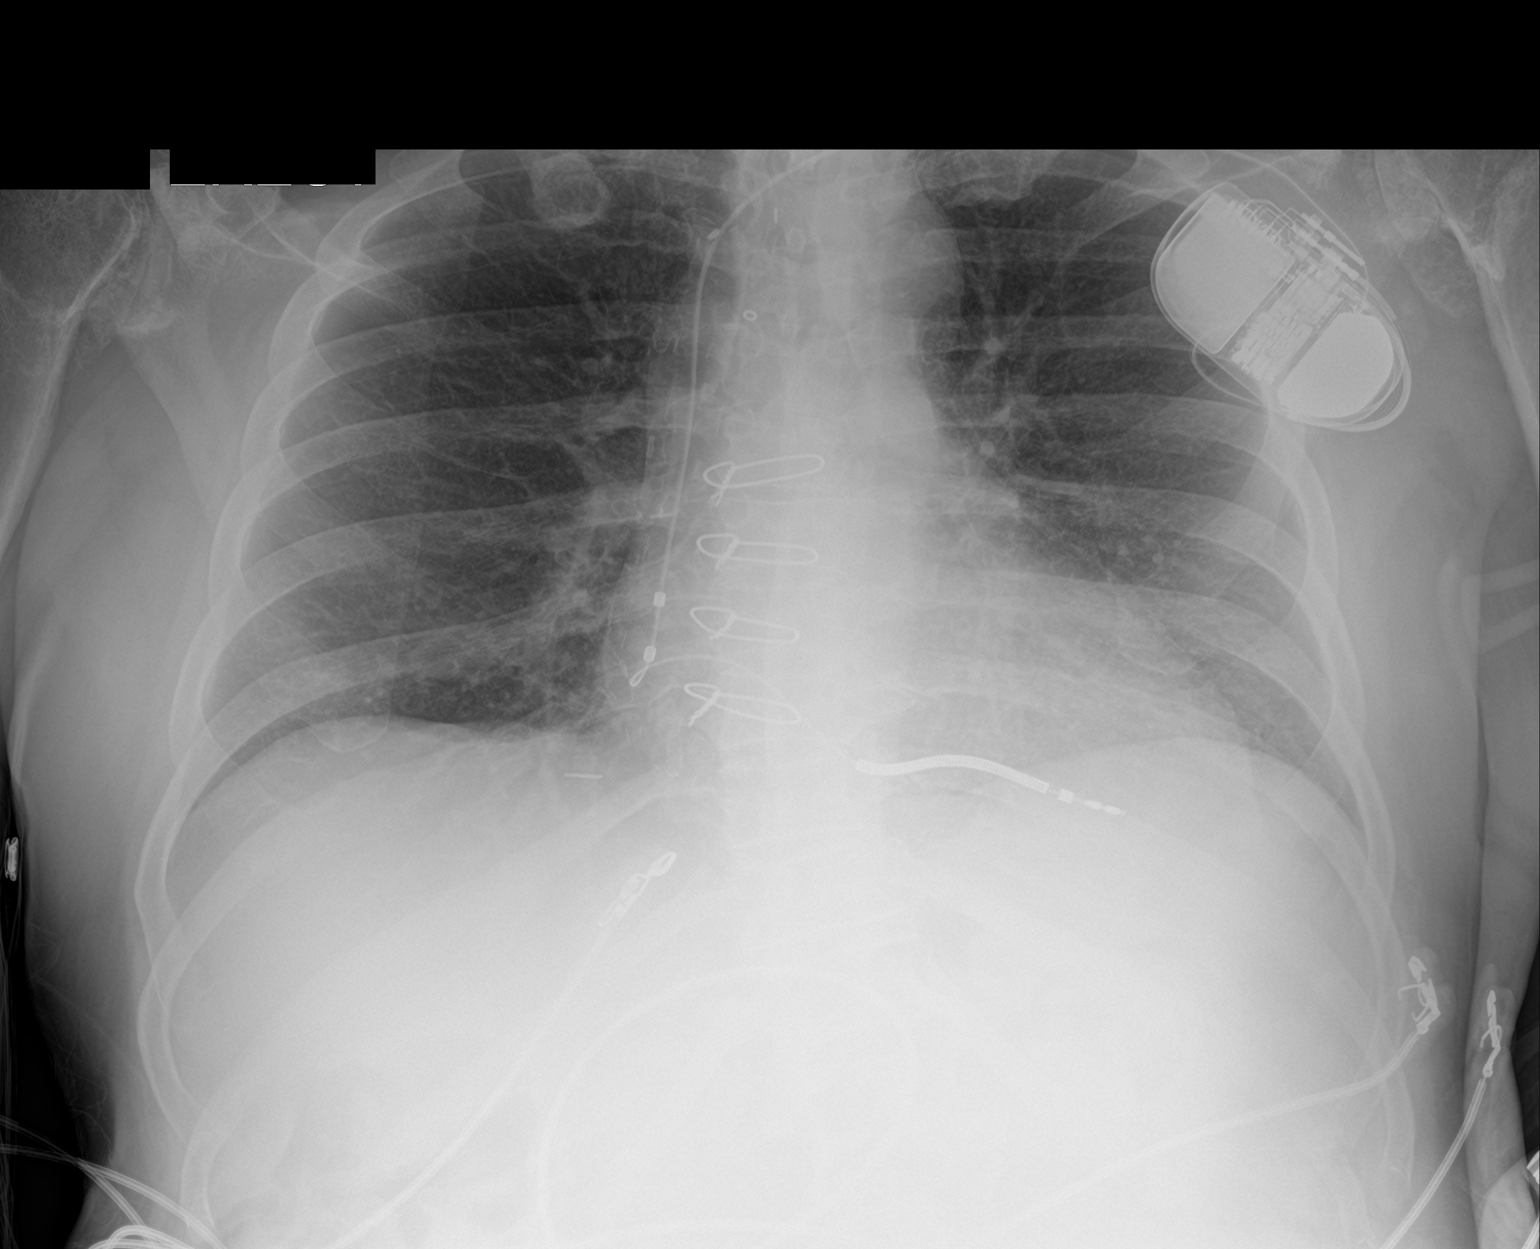

[2 of 2 positions shown; findings below may reference images not displayed]

FINDINGS: The lungs are well-aerated and clear. There is no evidence of focal
opacification, pleural effusion or pneumothorax.

The heart is borderline normal in size; the patient is status post
median sternotomy. An AICD is noted at the left chest wall, with a
single lead ending at the right ventricle. No acute osseous
abnormalities are seen. Chronic degenerative change is noted at both
glenohumeral joints, with mild bony remodeling at the humeral heads.
IMPRESSION: No acute cardiopulmonary process seen.

## 2018-03-08 IMAGING — CR DG ELBOW 2V*L*
2 series · 2 of 2 positions shown · non-contrast
Comparison: None.

CLINICAL DATA: Fell today with pain

EXAM:
LEFT ELBOW - 2 VIEW

[elbow ap]
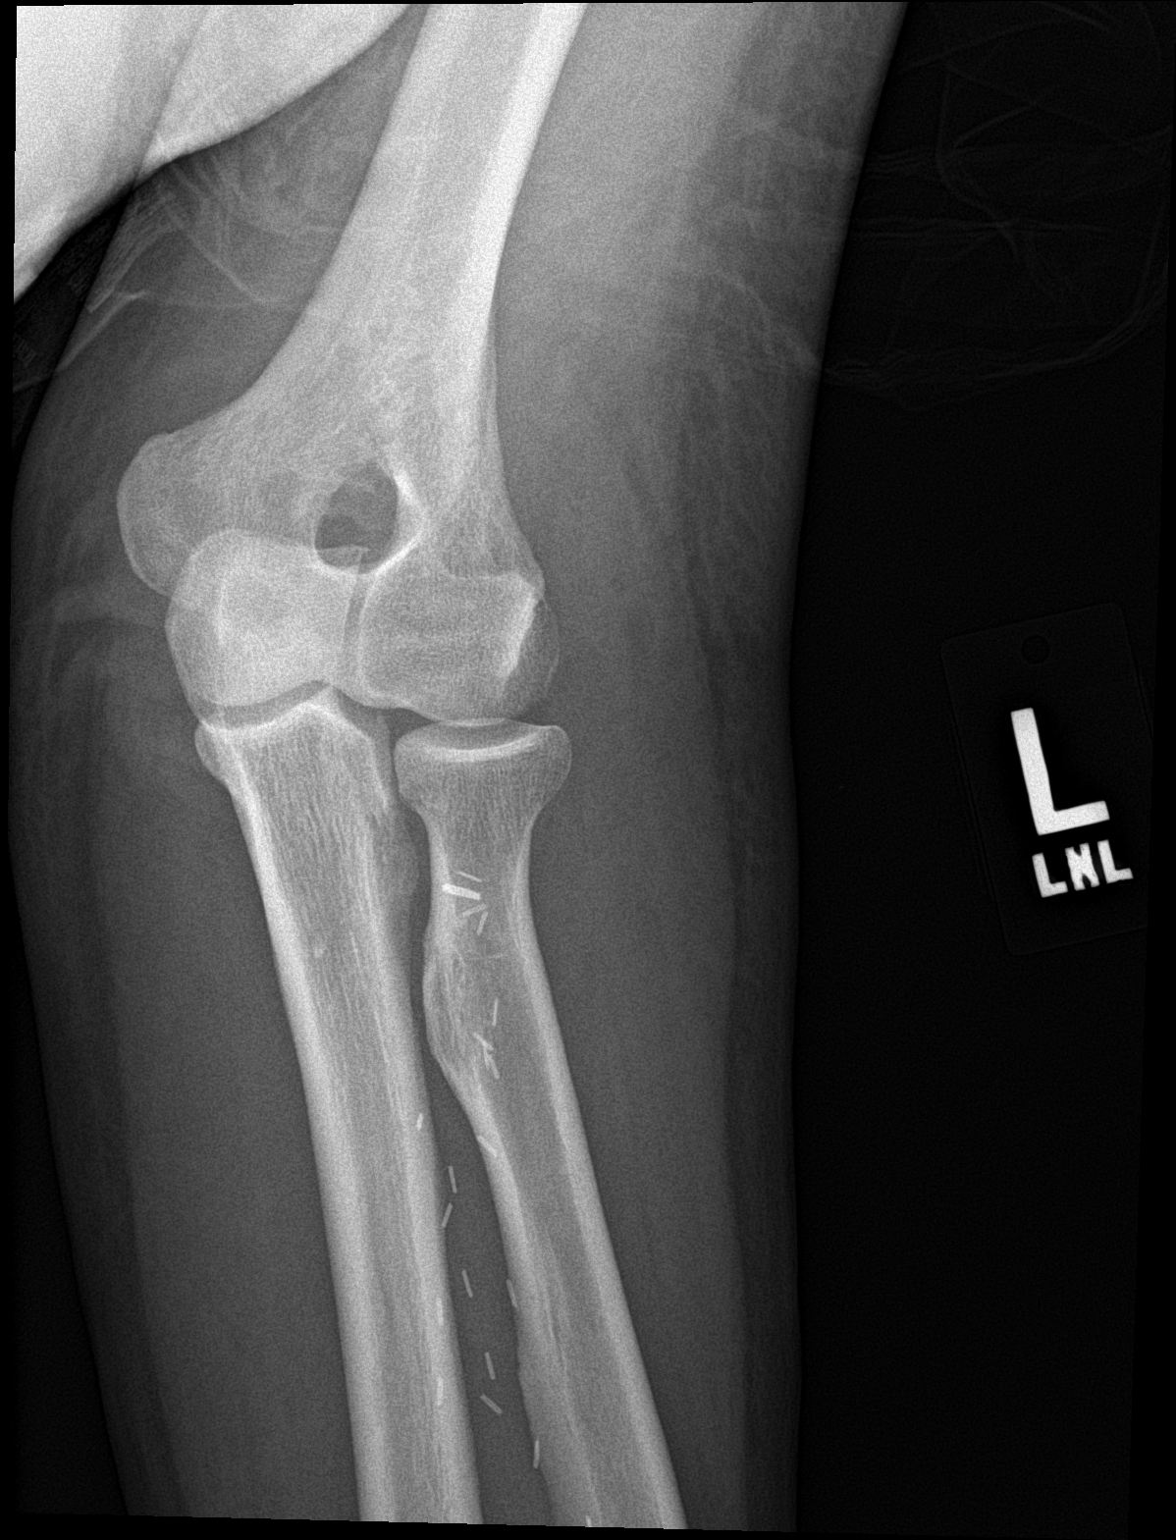

[elbow lat]
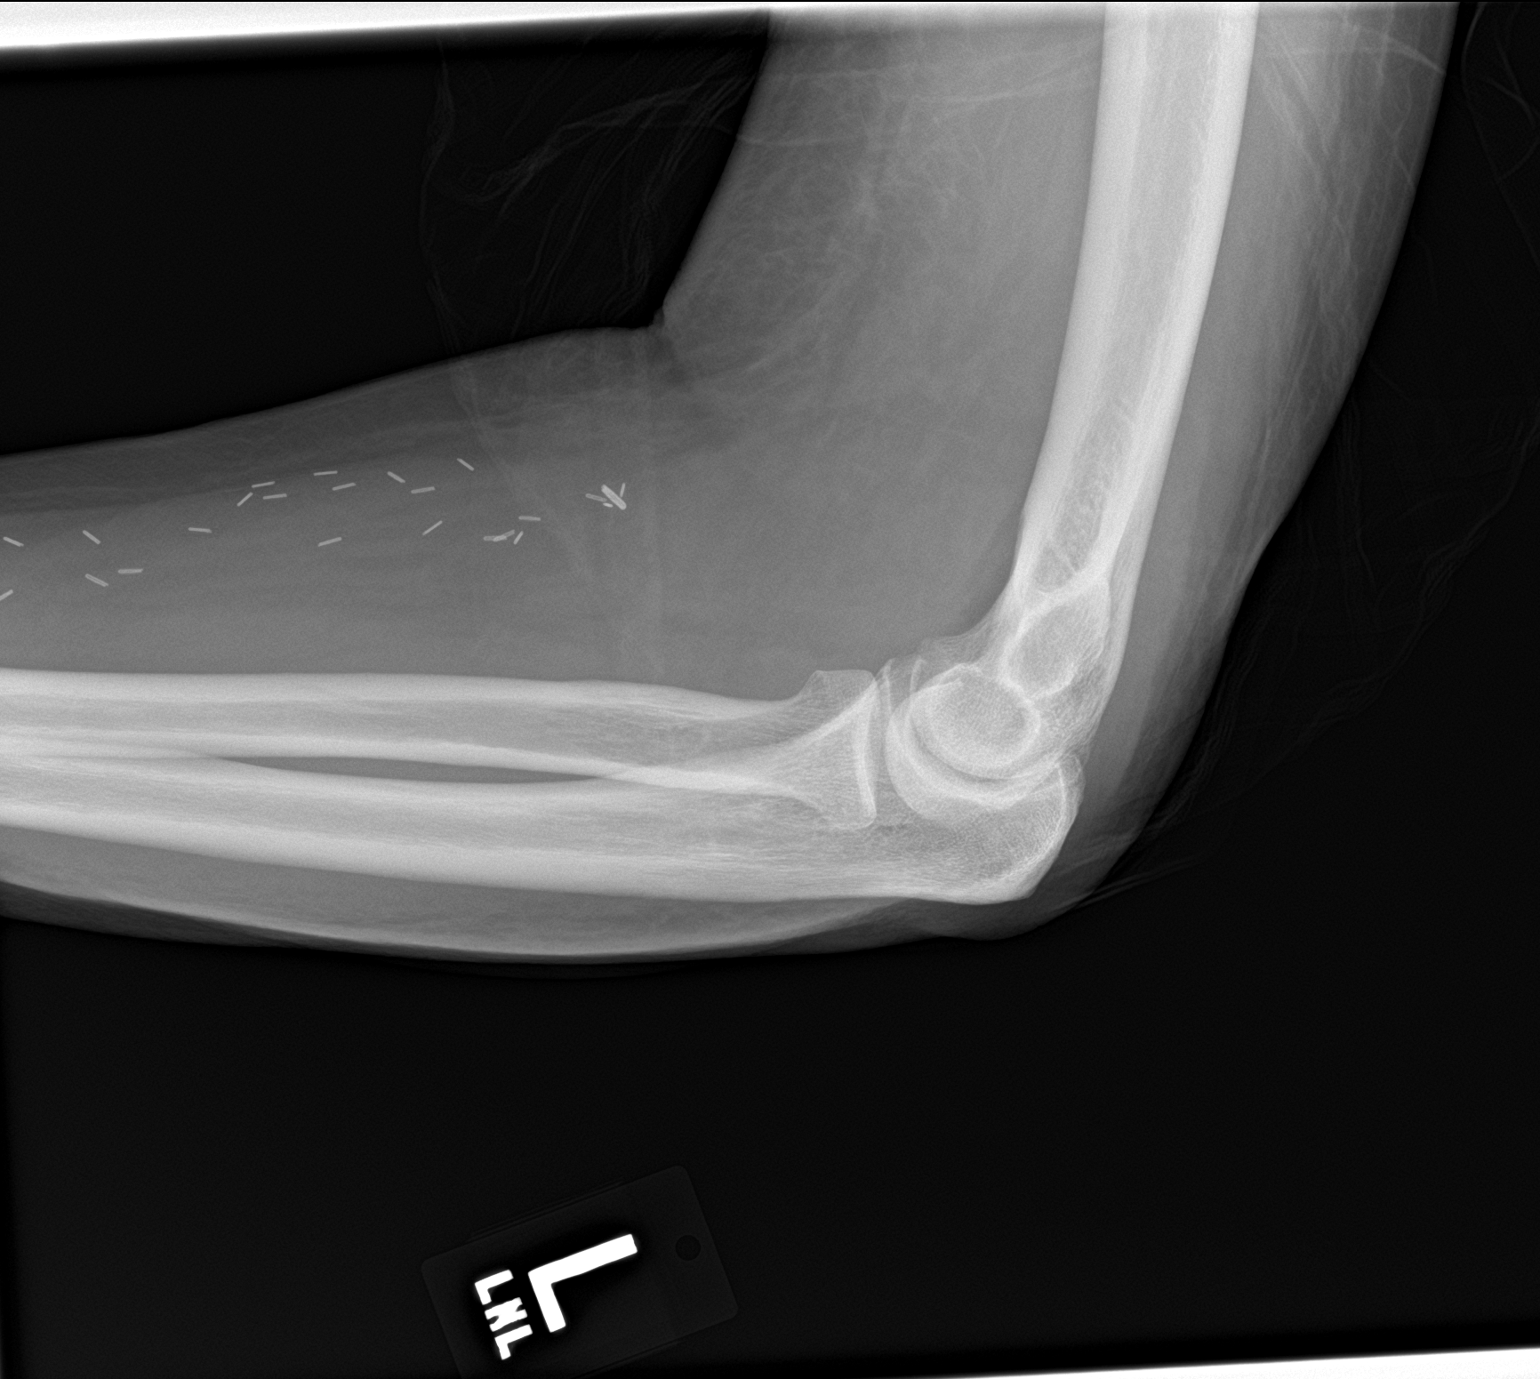

[2 of 2 positions shown; findings below may reference images not displayed]

FINDINGS: There is no evidence of fracture, dislocation, or joint effusion.
There is no evidence of arthropathy or other focal bone abnormality.
Soft tissues are unremarkable. Vascular clips in the forearm.
IMPRESSION: Negative.

## 2018-03-17 ENCOUNTER — Ambulatory Visit (INDEPENDENT_AMBULATORY_CARE_PROVIDER_SITE_OTHER): Payer: Federal, State, Local not specified - PPO | Admitting: *Deleted

## 2018-03-17 DIAGNOSIS — I255 Ischemic cardiomyopathy: Secondary | ICD-10-CM | POA: Diagnosis not present

## 2018-03-17 DIAGNOSIS — R55 Syncope and collapse: Secondary | ICD-10-CM

## 2018-03-18 LAB — CUP PACEART REMOTE DEVICE CHECK
Date Time Interrogation Session: 20200304050900
Implantable Lead Location: 753860
Implantable Pulse Generator Implant Date: 20141124
MDC IDC LEAD IMPLANT DT: 20141124
MDC IDC LEAD MODEL: 365501
MDC IDC LEAD SERIAL: 10552343
Pulse Gen Model: 383594
Pulse Gen Serial Number: 60740583

## 2018-03-23 ENCOUNTER — Other Ambulatory Visit: Payer: Self-pay | Admitting: Physician Assistant

## 2018-03-25 NOTE — Progress Notes (Signed)
Remote ICD transmission.   

## 2018-03-27 ENCOUNTER — Telehealth: Payer: Self-pay | Admitting: Family Medicine

## 2018-03-27 NOTE — Telephone Encounter (Signed)
Copied from [place] [id number]. Topic: General - Other >> Mar 27, 2018  3:17 PM Lennox Solders wrote: Reason for CRM: pt is calling today and last my charted  message to dr Brigitte Pulse was in  [place] 2019. Pt said dr Brigitte Pulse but him on 2 rounds of abx for low wbc and she was going to follow up with him. Pt is calling now due to corona virus he is concern. I have made this patient and appt to see dr Carlota Raspberry on 04-02-2018. Please advise

## 2018-03-30 NOTE — Telephone Encounter (Signed)
Pt has appointment for 3/19 and his concerns will be addressed.

## 2018-04-02 ENCOUNTER — Ambulatory Visit: Payer: Federal, State, Local not specified - PPO | Admitting: Family Medicine

## 2018-04-06 ENCOUNTER — Other Ambulatory Visit: Payer: Self-pay | Admitting: Physician Assistant

## 2018-04-09 DIAGNOSIS — F410 Panic disorder [episodic paroxysmal anxiety] without agoraphobia: Secondary | ICD-10-CM | POA: Diagnosis not present

## 2018-04-09 DIAGNOSIS — F3342 Major depressive disorder, recurrent, in full remission: Secondary | ICD-10-CM | POA: Diagnosis not present

## 2018-04-13 NOTE — Telephone Encounter (Signed)
Spoke with the patient, he stated that he has pain that starts at his waist and radiates up to his breast. When it occurs it takes his breath away due to the pain. It occurs once every 3-4 days and has been going on her 3 months. He states when it is hurting he can push on the site an replicate the pain. He notices the pain when he is moving around or leaning over to get laundry. He stated that it might be relayed to his bowels since he only has 2 bowel movements a week.

## 2018-04-15 NOTE — Telephone Encounter (Signed)
Spoke with the patient, he expressed understanding about reaching out to his PCP. He had no further questions.

## 2018-04-16 ENCOUNTER — Telehealth: Payer: Federal, State, Local not specified - PPO | Admitting: Internal Medicine

## 2018-04-16 ENCOUNTER — Other Ambulatory Visit: Payer: Self-pay

## 2018-04-16 NOTE — Progress Notes (Unsigned)
Electrophysiology TeleHealth Note   Due to national recommendations of social distancing due to COVID 19, an audio/video telehealth visit is felt to be most appropriate for this patient at this time.  See MyChart message from today for the patient's consent to telehealth for [facility].   Date:  04/16/2018   ID:  Hector Byrd, DOB 03-13-1955, MRN [id number]  Location: patient's home  Provider location: [address], [place]  Evaluation Performed: Follow-up visit  PCP:  Shawnee Knapp, MD  Cardiologist:  Fransico Him, MD  Electrophysiologist:  Dr Lovena Le  Chief Complaint:  ***  History of Present Illness:    Hector Byrd is a 63 y.o. male who presents via audio/video conferencing for a telehealth visit today.  Since last being seen in our clinic, the patient reports doing very well.  Today, he denies symptoms of palpitations, chest pain, shortness of breath,  lower extremity edema, dizziness, presyncope, or syncope.  The patient is otherwise without complaint today.  The patient denies symptoms of fevers, chills, cough, or new SOB worrisome for COVID 19.  Past Medical History:  Diagnosis Date  . AICD (automatic cardioverter/defibrillator) present   . Anxiety   . Arthritis   . CAD (coronary artery disease)    a. s/p stent x2 and then CABG 1996.  . Cancer of kidney ([place])    Kidney   . Cardiomyopathy, ischemic    EF 30% by MUGA s/p AICD  . Chronic combined systolic (congestive) and diastolic (congestive) heart failure ([place]) 07/07/2017  . Depression   . Dyslipidemia   . Dyspnea    with exertion  . Erectile dysfunction   . GERD (gastroesophageal reflux disease)   . Headache   . Hypertension   . Pneumonia   . Restless legs    not bothered by this anymore  . Rotator cuff tear   . S/P CABG (coronary artery bypass graft)     Past Surgical History:  Procedure Laterality Date  . CARDIAC CATHETERIZATION    . Cardiac stents     prior to 2005- 3 times total  .  COLONOSCOPY    . CORONARY ARTERY BYPASS GRAFT  2005  . CORONARY STENT PLACEMENT    . ICD LEAD REMOVAL N/A 10/02/2016   Procedure: ICD LEAD REMOVAL WITH IMPLANTATION OF BIOTRONIK PLEXA PROMRI DF-1 S DX 65/17 SN [id number];  Surgeon: Evans Lance, MD;  Location: [facility] OR;  Service: Cardiovascular;  Laterality: N/A;  Bartle to back up  . IMPLANTABLE CARDIOVERTER DEFIBRILLATOR IMPLANT  12-07-2012   BTK single chamber ICD implanted by Dr Lovena Le  . IMPLANTABLE CARDIOVERTER DEFIBRILLATOR IMPLANT N/A 12/07/2012   Procedure: IMPLANTABLE CARDIOVERTER DEFIBRILLATOR IMPLANT;  Surgeon: Evans Lance, MD;  Location[facility] CATH LAB;  Service: Cardiovascular;  Laterality: N/A;  . Kidney resection     small amount of kidney  . ROTATOR CUFF REPAIR    . TEE WITHOUT CARDIOVERSION N/A 10/02/2016   Procedure: TRANSESOPHAGEAL ECHOCARDIOGRAM (TEE);  Surgeon: Evans Lance, MD;  Location: [facility] OR;  Service: Cardiovascular;  Laterality: N/A;    Current Outpatient Medications  Medication Sig Dispense Refill  . aspirin EC 81 MG tablet Take 81 mg by mouth daily.     Marland Kitchen atorvastatin (LIPITOR) 40 MG tablet Take 1 tablet (40 mg total) by mouth daily. 90 tablet 3  . buprenorphine-naloxone (SUBOXONE) 8-2 MG SUBL SL tablet Place 0.5-1 tablets under the tongue See admin instructions. Up to 4 times daily 1 tablet in the  morning, 1/2 tablet at 10am and 5pm, 1 tablet at 8pm    . clonazePAM (KLONOPIN) 1 MG tablet Take 1-3 mg by mouth 2 (two) times daily. 1 mg by mouth in the morning & 3 mg at bedtime.    . lamoTRIgine (LAMICTAL) 100 MG tablet Take 100 mg by mouth 2 (two) times daily.     Marland Kitchen lisinopril (PRINIVIL,ZESTRIL) 5 MG tablet TAKE 1 TABLET BY MOUTH EVERY DAY 90 tablet 0  . metoprolol succinate (TOPROL-XL) 25 MG 24 hr tablet TAKE 1/2 TABLET BY MOUTH EVERY DAY 45 tablet 0  . naphazoline-pheniramine (NAPHCON-A) 0.025-0.3 % ophthalmic solution Place 1 drop into both eyes 2 (two) times daily.    . naproxen sodium (ANAPROX) 220 MG tablet  Take 220 mg by mouth 2 (two) times daily as needed (pain).     . nitroGLYCERIN (NITROSTAT) 0.4 MG SL tablet Place 0.4 mg under the tongue every 5 (five) minutes as needed for chest pain (MAX 3 TABLETS).     Marland Kitchen omeprazole (PRILOSEC) 20 MG capsule Take 20 mg by mouth daily as needed (heartburn).    . venlafaxine XR (EFFEXOR-XR) 150 MG 24 hr capsule Take 150 mg by mouth 2 (two) times daily.      No current facility-administered medications for this visit.     Allergies:   Patient has no known allergies.   Social History:  The patient  reports that he has been smoking cigarettes. He has a 20.00 pack-year smoking history. He has never used smokeless tobacco. He reports that he does not drink alcohol or use drugs.   Family History:  The patient's *** family history includes Cancer in his father; Diabetes in his mother.   ROS:  Please see the history of present illness.   All other systems are personally reviewed and negative.    Exam:    Vital Signs:  There were no vitals taken for this visit.  Well appearing, alert and conversant, regular work of breathing,  good skin color Eyes- anicteric, neuro- grossly intact, skin- no apparent rash or lesions or cyanosis, mouth- oral mucosa is pink   Labs/Other Tests and Data Reviewed:    Recent Labs: 10/02/2017: ALT 14; BUN 12; Creatinine, Ser 0.88; Potassium 4.7; Sodium 142   Wt Readings from Last 3 Encounters:  07/07/17 140 lb 1.9 oz (63.6 kg)  03/14/17 137 lb (62.1 kg)  10/03/16 137 lb 2 oz (62.2 kg)     Other studies personally reviewed: Additional studies/ records that were reviewed today include: ***  Review of the above records today demonstrates: as above Prior radiographs: ***  The patient presents wearable device technology report for my review today. On my review, the patient presents with Apple Watch tracings from *** . The tracings reveal ***  Last device remote is reviewed from [place] PDF dated *** which reveals normal device  function, no arrhythmias ***   ASSESSMENT & PLAN:    1.  ***   COVID 19 screen The patient denies symptoms of COVID 19 at this time.  The importance of social distancing was discussed today.  Follow-up:  *** Next remote: ***  Current medicines are reviewed at length with the patient today.   The patient {ACTIONS; HAS/DOES NOT HAVE:19233} concerns regarding his medicines.  The following changes were made today:  {NONE DEFAULTED:18576::"none"}  Labs/ tests ordered today include: *** No orders of the defined types were placed in this encounter.    Patient Risk:  after full review of this patients  clinical status, I feel that they are at moderate risk at this time.  Today, I have spent *** minutes with the patient with telehealth technology discussing *** .    Signed, Cristopher Peru, MD  04/16/2018 11:43 AM     [place] [address] [phone] (office) [phone] (fax)

## 2018-05-01 NOTE — Telephone Encounter (Signed)
LMOVM for pt to return call in talk about his home monitor.

## 2018-05-01 NOTE — Telephone Encounter (Signed)
Pt called back. He stated that the lights are amber and are blinking. Pt home monitor has not updated since 04/27/2018 this about when then pt noticed the lights blinking. Instructed pt to call tech support for further help. Pt verbalized understanding.

## 2018-06-16 ENCOUNTER — Ambulatory Visit (INDEPENDENT_AMBULATORY_CARE_PROVIDER_SITE_OTHER): Payer: Federal, State, Local not specified - PPO | Admitting: *Deleted

## 2018-06-16 DIAGNOSIS — I255 Ischemic cardiomyopathy: Secondary | ICD-10-CM | POA: Diagnosis not present

## 2018-06-18 LAB — CUP PACEART REMOTE DEVICE CHECK
Battery Remaining Percentage: 55 %
Battery Voltage: 3.11 V
Brady Statistic RA Percent Paced: 0 %
Brady Statistic RV Percent Paced: 0 %
Date Time Interrogation Session: 20200604114415
Implantable Lead Implant Date: 20141124
Implantable Lead Location: 753860
Implantable Lead Model: 365501
Implantable Lead Serial Number: 10552343
Implantable Pulse Generator Implant Date: 20141124
Lead Channel Impedance Value: 449 Ohm
Lead Channel Pacing Threshold Amplitude: 0.6 V
Lead Channel Pacing Threshold Pulse Width: 0.4 ms
Lead Channel Sensing Intrinsic Amplitude: 17.7 mV
Lead Channel Sensing Intrinsic Amplitude: 8 mV
Lead Channel Setting Pacing Amplitude: 2.5 V
Lead Channel Setting Pacing Pulse Width: 0.4 ms
Lead Channel Setting Sensing Sensitivity: 0.8 mV
Pulse Gen Model: 383594
Pulse Gen Serial Number: 60740583

## 2018-06-24 ENCOUNTER — Encounter: Payer: Self-pay | Admitting: Cardiology

## 2018-06-24 NOTE — Progress Notes (Signed)
Remote ICD transmission.   

## 2018-07-02 ENCOUNTER — Other Ambulatory Visit: Payer: Self-pay | Admitting: Cardiology

## 2018-07-08 ENCOUNTER — Other Ambulatory Visit: Payer: Self-pay | Admitting: Physician Assistant

## 2018-07-20 ENCOUNTER — Telehealth: Payer: Federal, State, Local not specified - PPO | Admitting: Internal Medicine

## 2018-07-20 ENCOUNTER — Other Ambulatory Visit: Payer: Self-pay

## 2018-08-18 IMAGING — CR DG CHEST 2V
2 series · 2 of 2 positions shown · non-contrast
Comparison: 09/30/2016.  07/19/2016.  CT 08/15/2016 .

CLINICAL DATA: AICD.

EXAM:
CHEST  2 VIEW

[chest lat]
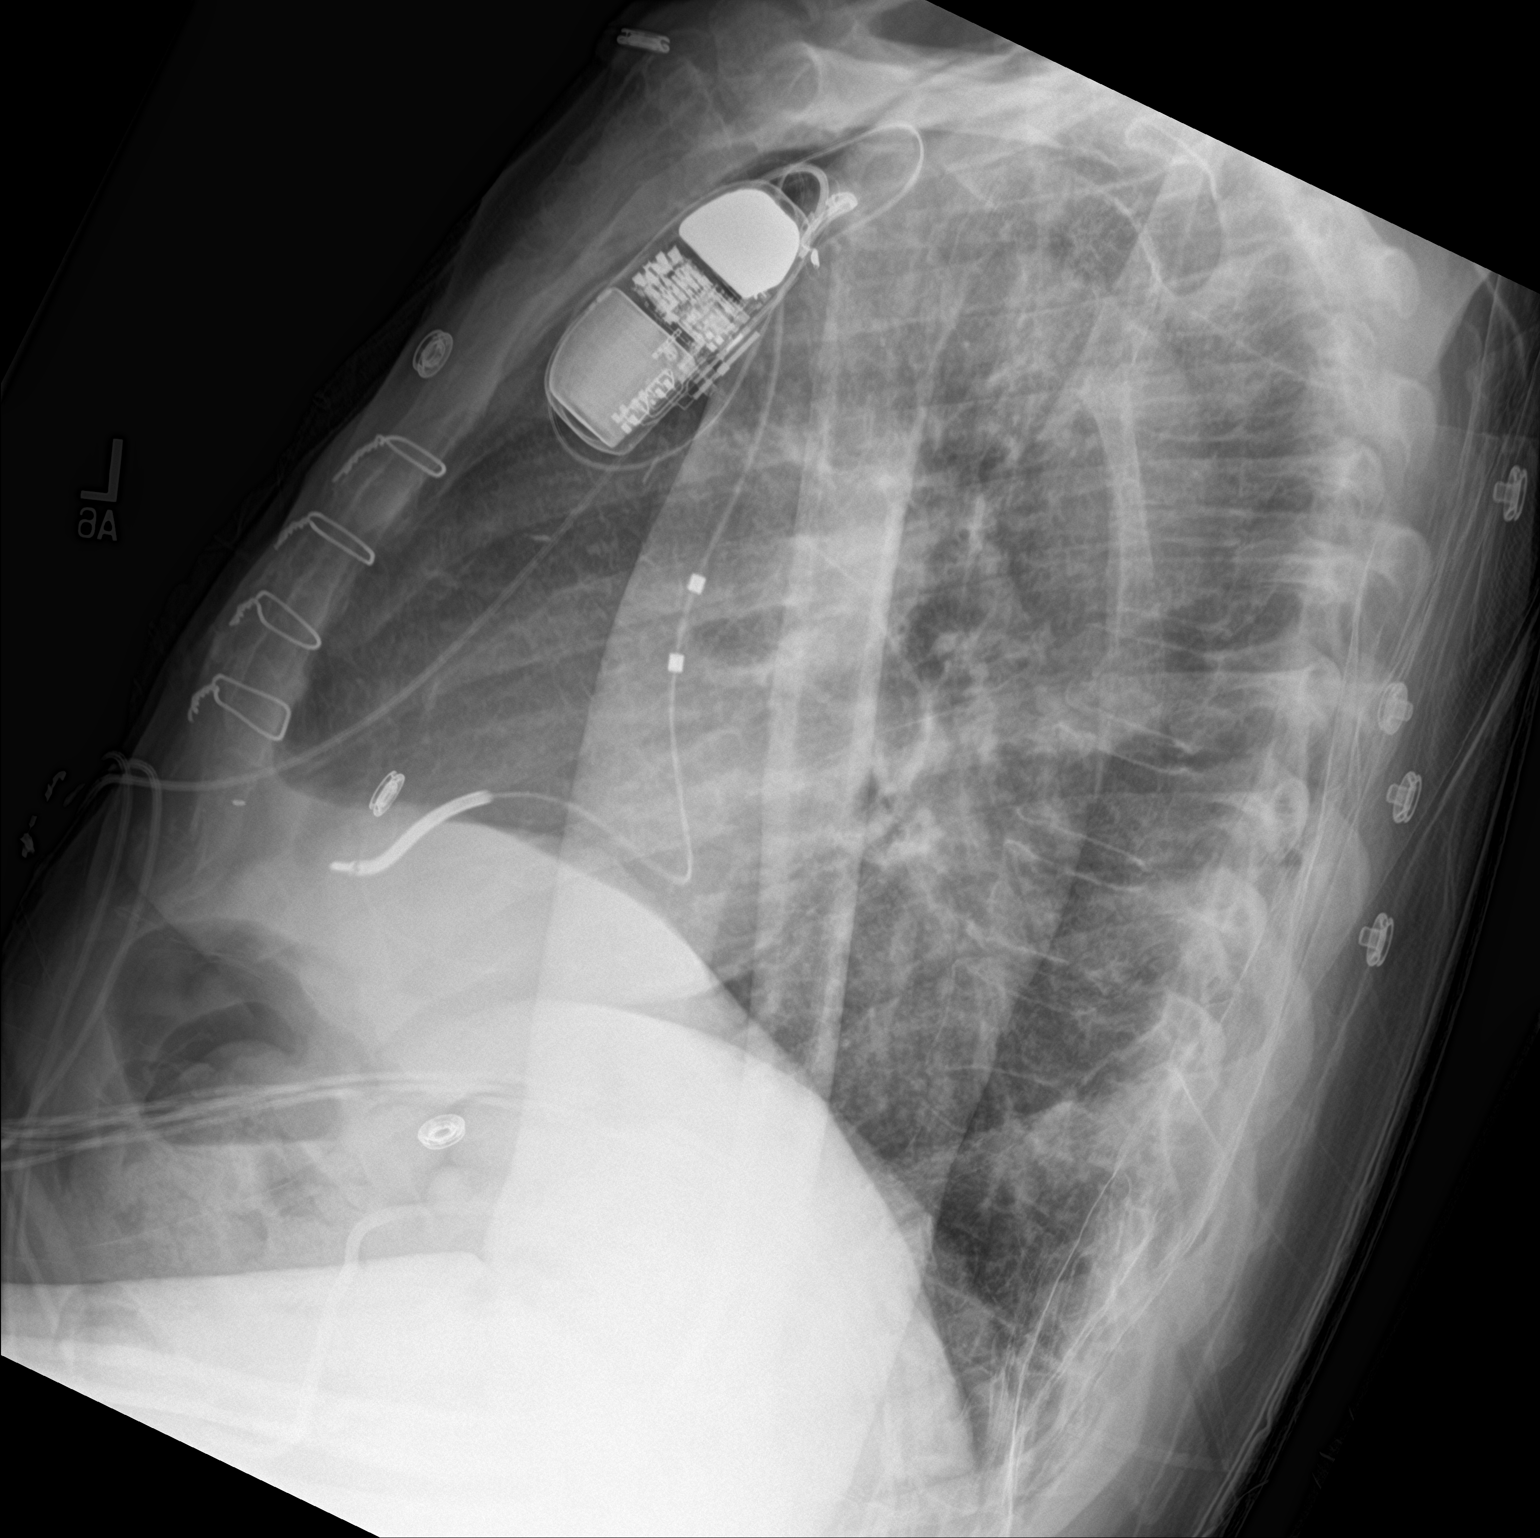

[chest ap]
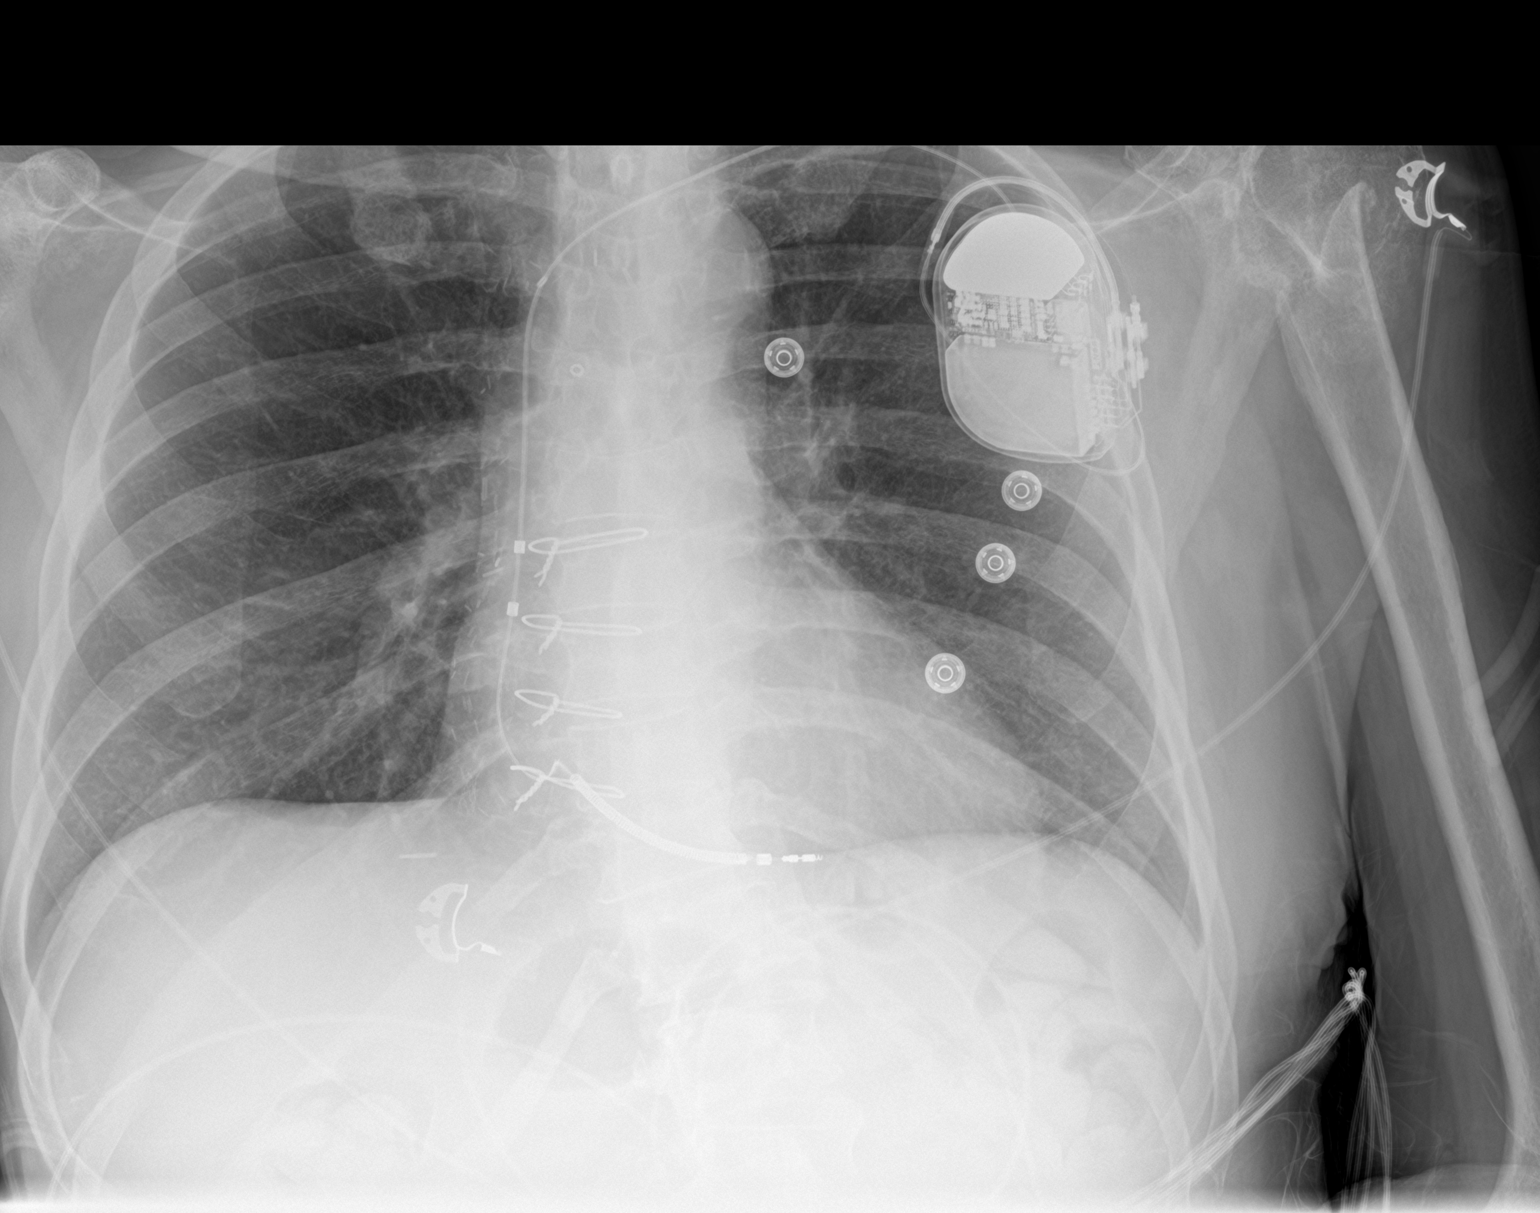

[2 of 2 positions shown; findings below may reference images not displayed]

FINDINGS: AICD noted in stable position. Prior CABG. Heart size normal. No
focal infiltrate. No pleural effusion or pneumothorax. Diffuse
thoracic spine osteopenia degenerative change. Old left humeral
fracture again noted .
IMPRESSION: 1. AICD in stable position.

2. Prior CABG.  Heart size stable.  No focal infiltrate .

## 2018-09-15 ENCOUNTER — Ambulatory Visit (INDEPENDENT_AMBULATORY_CARE_PROVIDER_SITE_OTHER): Payer: Federal, State, Local not specified - PPO | Admitting: *Deleted

## 2018-09-15 DIAGNOSIS — I472 Ventricular tachycardia, unspecified: Secondary | ICD-10-CM

## 2018-09-15 DIAGNOSIS — I255 Ischemic cardiomyopathy: Secondary | ICD-10-CM

## 2018-09-15 LAB — CUP PACEART REMOTE DEVICE CHECK
Date Time Interrogation Session: 20200901172243
Implantable Lead Implant Date: 20141124
Implantable Lead Location: 753860
Implantable Lead Model: 365501
Implantable Lead Serial Number: 10552343
Implantable Pulse Generator Implant Date: 20141124
Pulse Gen Model: 383594
Pulse Gen Serial Number: 60740583

## 2018-09-29 NOTE — Progress Notes (Signed)
Remote ICD transmission.   

## 2018-10-02 ENCOUNTER — Other Ambulatory Visit: Payer: Self-pay | Admitting: Cardiology

## 2018-10-06 ENCOUNTER — Other Ambulatory Visit: Payer: Self-pay | Admitting: Internal Medicine

## 2018-11-13 ENCOUNTER — Encounter: Payer: Self-pay | Admitting: Internal Medicine

## 2018-11-13 ENCOUNTER — Other Ambulatory Visit: Payer: Self-pay

## 2018-11-13 ENCOUNTER — Ambulatory Visit: Payer: Federal, State, Local not specified - PPO | Admitting: Internal Medicine

## 2018-11-13 VITALS — BP 128/70 | HR 81 | Ht 68.0 in | Wt 147.0 lb

## 2018-11-13 DIAGNOSIS — I255 Ischemic cardiomyopathy: Secondary | ICD-10-CM

## 2018-11-13 DIAGNOSIS — I472 Ventricular tachycardia, unspecified: Secondary | ICD-10-CM

## 2018-11-13 DIAGNOSIS — Z95810 Presence of automatic (implantable) cardiac defibrillator: Secondary | ICD-10-CM | POA: Diagnosis not present

## 2018-11-13 MED ORDER — METOPROLOL SUCCINATE ER 25 MG PO TB24: 12.5000 mg | ORAL_TABLET | Freq: Every day | ORAL | 3 refills | Status: DC

## 2018-11-13 MED ORDER — NITROGLYCERIN 0.4 MG SL SUBL: 0.4000 mg | SUBLINGUAL_TABLET | SUBLINGUAL | 3 refills | Status: DC | PRN

## 2018-11-13 NOTE — Progress Notes (Signed)
HPI Hector Byrd returns today for followup. We have not seen him for almost 2 years. He has a h/o ICD shocks due to noise and had his lead removed. He returns today for followup. He has had a single remote ICD therapy. He denies chest pain or sob. He has had some dizzy spells the last about a year ago. He notes low blood count and was undergoing workup at [facility] urgent care. He is still smoking.  No Known Allergies   Current Outpatient Medications  Medication Sig Dispense Refill  . aspirin EC 81 MG tablet Take 81 mg by mouth daily.     Marland Kitchen atorvastatin (LIPITOR) 40 MG tablet TAKE 1 TABLET BY MOUTH EVERY DAY 30 tablet 0  . buprenorphine-naloxone (SUBOXONE) 8-2 MG SUBL SL tablet Place 0.5-1 tablets under the tongue See admin instructions. Up to 4 times daily 1 tablet in the morning, 1/2 tablet at 10am and 5pm, 1 tablet at 8pm    . clonazePAM (KLONOPIN) 1 MG tablet Take 1-3 mg by mouth 2 (two) times daily. 1 mg by mouth in the morning & 3 mg at bedtime.    . lamoTRIgine (LAMICTAL) 150 MG tablet Take 300 mg by mouth daily.    Marland Kitchen lisinopril (ZESTRIL) 5 MG tablet TAKE 1 TABLET BY MOUTH EVERY DAY 90 tablet 2  . metoprolol succinate (TOPROL-XL) 25 MG 24 hr tablet Take 0.5 tablets (12.5 mg total) by mouth daily. 45 tablet 0  . naphazoline-pheniramine (NAPHCON-A) 0.025-0.3 % ophthalmic solution Place 1 drop into both eyes 2 (two) times daily.    . nitroGLYCERIN (NITROSTAT) 0.4 MG SL tablet Place 0.4 mg under the tongue every 5 (five) minutes as needed for chest pain (MAX 3 TABLETS).     Marland Kitchen venlafaxine XR (EFFEXOR-XR) 150 MG 24 hr capsule Take 150 mg by mouth 2 (two) times daily.      No current facility-administered medications for this visit.      Past Medical History:  Diagnosis Date  . AICD (automatic cardioverter/defibrillator) present   . Anxiety   . Arthritis   . CAD (coronary artery disease)    a. s/p stent x2 and then CABG 1996.  . Cancer of kidney ([place])    Kidney   . Cardiomyopathy,  ischemic    EF 30% by MUGA s/p AICD  . Chronic combined systolic (congestive) and diastolic (congestive) heart failure ([place]) 07/07/2017  . Depression   . Dyslipidemia   . Dyspnea    with exertion  . Erectile dysfunction   . GERD (gastroesophageal reflux disease)   . Headache   . Hypertension   . Pneumonia   . Restless legs    not bothered by this anymore  . Rotator cuff tear   . S/P CABG (coronary artery bypass graft)     ROS:   All systems reviewed and negative except as noted in the HPI.   Past Surgical History:  Procedure Laterality Date  . CARDIAC CATHETERIZATION    . Cardiac stents     prior to 2005- 3 times total  . COLONOSCOPY    . CORONARY ARTERY BYPASS GRAFT  2005  . CORONARY STENT PLACEMENT    . ICD LEAD REMOVAL N/A 10/02/2016   Procedure: ICD LEAD REMOVAL WITH IMPLANTATION OF BIOTRONIK PLEXA PROMRI DF-1 S DX 65/17 SN [id number];  Surgeon: Evans Lance, MD;  Location: [facility] OR;  Service: Cardiovascular;  Laterality: N/A;  Bartle to back up  . IMPLANTABLE CARDIOVERTER DEFIBRILLATOR IMPLANT  12-07-2012   BTK single chamber ICD implanted by Dr Lovena Le  . IMPLANTABLE CARDIOVERTER DEFIBRILLATOR IMPLANT N/A 12/07/2012   Procedure: IMPLANTABLE CARDIOVERTER DEFIBRILLATOR IMPLANT;  Surgeon: Evans Lance, MD;  Location: [facility] CATH LAB;  Service: Cardiovascular;  Laterality: N/A;  . Kidney resection     small amount of kidney  . ROTATOR CUFF REPAIR    . TEE WITHOUT CARDIOVERSION N/A 10/02/2016   Procedure: TRANSESOPHAGEAL ECHOCARDIOGRAM (TEE);  Surgeon: Evans Lance, MD;  Location: [facility] OR;  Service: Cardiovascular;  Laterality: N/A;     Family History  Problem Relation Age of Onset  . Diabetes Mother   . Cancer Father      Social History   Socioeconomic History  . Marital status: Single    Spouse name: Not on file  . Number of children: Not on file  . Years of education: Not on file  . Highest education level: Not on file  Occupational History  . Not on file   Social Needs  . Financial resource strain: Not on file  . Food insecurity    Worry: Not on file    Inability: Not on file  . Transportation needs    Medical: Not on file    Non-medical: Not on file  Tobacco Use  . Smoking status: Current Every Day Smoker    Packs/day: 0.50    Years: 40.00    Pack years: 20.00    Types: Cigarettes  . Smokeless tobacco: Never Used  . Tobacco comment: uses cigarelle- 4 per day  Substance and Sexual Activity  . Alcohol use: No    Comment: HISTORY OF DRUG & ETOH 09/19/2004  . Drug use: No    Comment: none since 09/19/2004  . Sexual activity: Never  Lifestyle  . Physical activity    Days per week: Not on file    Minutes per session: Not on file  . Stress: Not on file  Relationships  . Social Herbalist on phone: Not on file    Gets together: Not on file    Attends religious service: Not on file    Active member of club or organization: Not on file    Attends meetings of clubs or organizations: Not on file    Relationship status: Not on file  . Intimate partner violence    Fear of current or ex partner: Not on file    Emotionally abused: Not on file    Physically abused: Not on file    Forced sexual activity: Not on file  Other Topics Concern  . Not on file  Social History Narrative  . Not on file     BP 128/70   Pulse 81   Ht 5\' 8"  (1.727 m)   Wt 147 lb (66.7 kg)   SpO2 97%   BMI 22.35 kg/m   Physical Exam:  Chronically ill appearing NAD HEENT: Unremarkable Neck:  6 cm JVD, no thyromegally Lymphatics:  No adenopathy Back:  No CVA tenderness Lungs:  Clear with no wheezes HEART:  Regular rate rhythm, no murmurs, no rubs, no clicks Abd:  soft, positive bowel sounds, no organomegally, no rebound, no guarding Ext:  2 plus pulses, no edema, no cyanosis, no clubbing Skin:  No rashes no nodules Neuro:  CN II through XII intact, motor grossly intact  EKG - NSR with old Inferior MI  DEVICE  Normal device function.  See  PaceArt for details.   Assess/Plan: 1. ICM - he denies anginal symptoms.  He will continue his current meds. I asked him to stop smoking/using tobacco. 2. ICD - his biotronik VDD ICD is working normally and he will followup in several months. 3. HTN - his bp is now well controlled.   Mikle Bosworth.D.

## 2018-11-13 NOTE — Patient Instructions (Addendum)
Medication Instructions:  Your physician recommends that you continue on your current medications as directed. Please refer to the Current Medication list given to you today.  Labwork: None ordered.  Testing/Procedures: None ordered.  Follow-Up: Your physician wants you to follow-up in: one year with Dr. Lovena Le.   You will receive a reminder letter in the mail two months in advance. If you don't receive a letter, please call our office to schedule the follow-up appointment.  Remote monitoring is used to monitor your ICD from home. This monitoring reduces the number of office visits required to check your device to one time per year. It allows [place] to keep an eye on the functioning of your device to ensure it is working properly. You are scheduled for a device check from home on 12/15/2018. You may send your transmission at any time that day. If you have a wireless device, the transmission will be sent automatically. After your physician reviews your transmission, you will receive a postcard with your next transmission date.  Any Other Special Instructions Will Be Listed Below (If Applicable).  [facility] [place]--[phone]  If you need a refill on your cardiac medications before your next appointment, please call your pharmacy.

## 2018-12-15 ENCOUNTER — Ambulatory Visit (INDEPENDENT_AMBULATORY_CARE_PROVIDER_SITE_OTHER): Payer: Federal, State, Local not specified - PPO | Admitting: *Deleted

## 2018-12-15 DIAGNOSIS — I255 Ischemic cardiomyopathy: Secondary | ICD-10-CM | POA: Diagnosis not present

## 2018-12-15 LAB — CUP PACEART REMOTE DEVICE CHECK
Date Time Interrogation Session: 20201201121026
Implantable Lead Implant Date: 20141124
Implantable Lead Location: 753860
Implantable Lead Model: 365501
Implantable Lead Serial Number: 10552343
Implantable Pulse Generator Implant Date: 20141124
Pulse Gen Model: 383594
Pulse Gen Serial Number: 60740583

## 2019-01-09 NOTE — Progress Notes (Signed)
ICD remote 

## 2019-03-16 ENCOUNTER — Ambulatory Visit (INDEPENDENT_AMBULATORY_CARE_PROVIDER_SITE_OTHER): Payer: Federal, State, Local not specified - PPO | Admitting: *Deleted

## 2019-03-16 DIAGNOSIS — I255 Ischemic cardiomyopathy: Secondary | ICD-10-CM

## 2019-03-16 LAB — CUP PACEART REMOTE DEVICE CHECK
Date Time Interrogation Session: 20210302132137
Implantable Lead Implant Date: 20141124
Implantable Lead Location: 753860
Implantable Lead Model: 365
Implantable Lead Serial Number: 10552343
Implantable Pulse Generator Implant Date: 20141124
Pulse Gen Model: 383594
Pulse Gen Serial Number: 60740583

## 2019-03-17 NOTE — Progress Notes (Signed)
ICD Remote  

## 2019-03-26 ENCOUNTER — Other Ambulatory Visit: Payer: Self-pay | Admitting: Cardiology

## 2019-04-22 ENCOUNTER — Other Ambulatory Visit: Payer: Self-pay | Admitting: Cardiology

## 2019-06-15 ENCOUNTER — Ambulatory Visit (INDEPENDENT_AMBULATORY_CARE_PROVIDER_SITE_OTHER): Payer: Federal, State, Local not specified - PPO | Admitting: *Deleted

## 2019-06-15 DIAGNOSIS — I255 Ischemic cardiomyopathy: Secondary | ICD-10-CM

## 2019-06-15 LAB — CUP PACEART REMOTE DEVICE CHECK
Date Time Interrogation Session: 20210601150430
Implantable Lead Implant Date: 20141124
Implantable Lead Location: 753860
Implantable Lead Model: 365
Implantable Lead Serial Number: 10552343
Implantable Pulse Generator Implant Date: 20141124
Pulse Gen Model: 383594
Pulse Gen Serial Number: 60740583

## 2019-06-16 NOTE — Progress Notes (Signed)
Remote ICD transmission.   

## 2019-09-06 ENCOUNTER — Encounter: Payer: Self-pay | Admitting: Family Medicine

## 2019-09-06 ENCOUNTER — Other Ambulatory Visit: Payer: Self-pay

## 2019-09-06 ENCOUNTER — Ambulatory Visit: Payer: Federal, State, Local not specified - PPO | Admitting: Family Medicine

## 2019-09-06 VITALS — BP 130/80 | HR 103 | Temp 98.5°F | Resp 16 | Wt 155.0 lb

## 2019-09-06 DIAGNOSIS — I2510 Atherosclerotic heart disease of native coronary artery without angina pectoris: Secondary | ICD-10-CM

## 2019-09-06 DIAGNOSIS — R9431 Abnormal electrocardiogram [ECG] [EKG]: Secondary | ICD-10-CM

## 2019-09-06 DIAGNOSIS — I5042 Chronic combined systolic (congestive) and diastolic (congestive) heart failure: Secondary | ICD-10-CM

## 2019-09-06 DIAGNOSIS — F17200 Nicotine dependence, unspecified, uncomplicated: Secondary | ICD-10-CM

## 2019-09-06 DIAGNOSIS — J439 Emphysema, unspecified: Secondary | ICD-10-CM

## 2019-09-06 DIAGNOSIS — R0600 Dyspnea, unspecified: Secondary | ICD-10-CM

## 2019-09-06 DIAGNOSIS — R5383 Other fatigue: Secondary | ICD-10-CM | POA: Diagnosis not present

## 2019-09-06 DIAGNOSIS — E785 Hyperlipidemia, unspecified: Secondary | ICD-10-CM

## 2019-09-06 DIAGNOSIS — I255 Ischemic cardiomyopathy: Secondary | ICD-10-CM

## 2019-09-06 DIAGNOSIS — I10 Essential (primary) hypertension: Secondary | ICD-10-CM

## 2019-09-06 DIAGNOSIS — Z85528 Personal history of other malignant neoplasm of kidney: Secondary | ICD-10-CM

## 2019-09-06 DIAGNOSIS — R0989 Other specified symptoms and signs involving the circulatory and respiratory systems: Secondary | ICD-10-CM | POA: Insufficient documentation

## 2019-09-06 DIAGNOSIS — R0609 Other forms of dyspnea: Secondary | ICD-10-CM | POA: Insufficient documentation

## 2019-09-06 NOTE — Progress Notes (Signed)
Subjective:    Patient ID: Hector Byrd, male    DOB: 08-05-1955, 64 y.o.   MRN: [id number]  HPI Chief Complaint  Patient presents with  . new pt    new pt get established, SOB x 6 months- getting worse. kidney cancer- had removed, still having side pain- concerned cancer has come back. exhausted,    He is new to the practice and here for to establish care. Moved here from [facility] 10 years ago.  Previous medical care: Dr. Delman Cheadle. Last there in 2018   He has quite a complex past medical history.   Other providers: Cardiologist- Dr Lovena Le  Psychiatrist- Dr. Toy Care  Therapist- "Joe". States Wille Glaser also prescribes his Suboxone.    Complains of intermittent right side pain for the past 3 years. Pain is not as bad as in the past. No unexplained weight loss. Denies fever, chills, chest pain, palpitations, N/V/D or constipation.   States he takes 1 Aleve and 1 ASA 81 mg daily.   History of CHF and ischemic cardiomyopathy. EF 30% -35% in 2018  Hx of CAD, CABG and has AICD.   States he had his first MI at age 50.   States he cannot walk 1/2 block without feeling exhausted and having severe shortness of breath. Denies cough or dyspnea at rest.   States he had surgery to remove kidney cancer on the right 12 years ago in [place], [place].   Hx of dislocated left shoulder that he states needs surgery.   States he has been treated for depression since 25 years  Has been taking Suboxone for 10 years or so.   09/19/04 -stopped drinking   Smokes cigars now   Social history: Lives alone, has a Magazine features editor. States he does not have any family he talks to. 2 sisters.  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   Reviewed allergies, medications, past medical, surgical, family, and social history.    Review of Systems Pertinent positives and negatives in the history of present illness.     Objective:   Physical Exam Constitutional:      General: He is not in acute distress.     Appearance: He is not ill-appearing.  Eyes:     Conjunctiva/sclera: Conjunctivae normal.  Cardiovascular:     Rate and Rhythm: Normal rate and regular rhythm.     Pulses: Normal pulses.  Abdominal:     General: Abdomen is flat. Bowel sounds are normal. There is no distension.     Palpations: Abdomen is soft.     Tenderness: There is no abdominal tenderness. There is no guarding or rebound.  Musculoskeletal:     Cervical back: Neck supple.  Skin:    General: Skin is warm and dry.     Capillary Refill: Capillary refill takes less than 2 seconds.  Neurological:     General: No focal deficit present.     Mental Status: He is alert and oriented to person, place, and time.     Cranial Nerves: No cranial nerve deficit.     Sensory: No sensory deficit.     Motor: No weakness.     Coordination: Coordination normal.  Psychiatric:        Mood and Affect: Mood normal.        Thought Content: Thought content normal.    BP 130/80   Pulse (!) 103   Temp 98.5 F (36.9 C)   Resp 16   Wt 155 lb (  70.3 kg)   SpO2 95%   BMI 23.57 kg/m       Assessment & Plan:  DOE (dyspnea on exertion) - Plan: CBC with Differential/Platelet, Comprehensive metabolic panel, EKG 11-NBVA, Brain natriuretic peptide, DG Chest 2 View Worsening over the past 6 months. No sign of infection or fluid overload. Check labs, chest XR and follow up with cardiology.   Chronic combined systolic (congestive) and diastolic (congestive) heart failure (HCC) - Plan: EKG 12-Lead, Brain natriuretic peptide, DG Chest 2 View -appears euvolemic. Chest labs including BNP. Chest XR ordered. EKG does not show any acute changes.   Fatigue, unspecified type - Plan: CBC with Differential/Platelet, Comprehensive metabolic panel, EKG 70-LIDC, VITAMIN D 25 Hydroxy (Vit-D Deficiency, Fractures), Vitamin B12, TSH, T4, free -discussed multiple etiologies. Check labs and follow up  Coronary artery disease involving native coronary artery of  native heart without angina pectoris - Plan: Lipid panel, DG Chest 2 View -continue statin. F/U with cardiologist.   Cardiomyopathy, ischemic - Plan: Lipid panel, DG Chest 2 View  Essential hypertension - Plan: CBC with Differential/Platelet, Comprehensive metabolic panel BP in goal range. Continue current medication regimen. Recommend low sodium diet   Smoker - Plan: DG Chest 2 View -he is now smoking cigars. Encouraged smoking cessation   Abnormal lung sounds - Plan: DG Chest 2 View -normal work of breathing at rest. No sign of infection. Hx of emphysema. Chest XR ordered.   Dyslipidemia -continue statin.   History of kidney cancer  Abnormal ECG -EKG shows NSR, rate 81, no acute changes compared to previous EKG. Read by myself and Dr. Redmond School. Follow up with cardiology ASAP  Pulmonary emphysema, unspecified emphysema type ([place]) - Plan: DG Chest 2 View -unclear as to whether he has been evaluated by pulmonology in the past. No inhalers

## 2019-09-07 ENCOUNTER — Other Ambulatory Visit: Payer: Self-pay | Admitting: Family Medicine

## 2019-09-07 ENCOUNTER — Encounter: Payer: Self-pay | Admitting: Family Medicine

## 2019-09-07 DIAGNOSIS — R748 Abnormal levels of other serum enzymes: Secondary | ICD-10-CM

## 2019-09-07 DIAGNOSIS — E559 Vitamin D deficiency, unspecified: Secondary | ICD-10-CM | POA: Insufficient documentation

## 2019-09-07 LAB — VITAMIN D 25 HYDROXY (VIT D DEFICIENCY, FRACTURES): Vit D, 25-Hydroxy: 12.4 ng/mL — ABNORMAL LOW (ref 30.0–100.0)

## 2019-09-07 LAB — COMPREHENSIVE METABOLIC PANEL
ALT: 20 IU/L (ref 0–44)
AST: 25 IU/L (ref 0–40)
Albumin/Globulin Ratio: 1.6 (ref 1.2–2.2)
Albumin: 4.6 g/dL (ref 3.8–4.8)
Alkaline Phosphatase: 189 IU/L — ABNORMAL HIGH (ref 48–121)
BUN/Creatinine Ratio: 16 (ref 10–24)
BUN: 20 mg/dL (ref 8–27)
Bilirubin Total: 0.3 mg/dL (ref 0.0–1.2)
CO2: 27 mmol/L (ref 20–29)
Calcium: 10.4 mg/dL — ABNORMAL HIGH (ref 8.6–10.2)
Chloride: 102 mmol/L (ref 96–106)
Creatinine, Ser: 1.27 mg/dL (ref 0.76–1.27)
GFR calc Af Amer: 68 mL/min/{1.73_m2} (ref >59)
GFR calc non Af Amer: 58 mL/min/{1.73_m2} — ABNORMAL LOW (ref >59)
Globulin, Total: 2.9 g/dL (ref 1.5–4.5)
Glucose: 97 mg/dL (ref 65–99)
Potassium: 5.3 mmol/L — ABNORMAL HIGH (ref 3.5–5.2)
Sodium: 142 mmol/L (ref 134–144)
Total Protein: 7.5 g/dL (ref 6.0–8.5)

## 2019-09-07 LAB — BRAIN NATRIURETIC PEPTIDE: BNP: 52.5 pg/mL (ref 0.0–100.0)

## 2019-09-07 LAB — CBC WITH DIFFERENTIAL/PLATELET
Basophils Absolute: 0.1 10*3/uL (ref 0.0–0.2)
Basos: 1 %
EOS (ABSOLUTE): 0.2 10*3/uL (ref 0.0–0.4)
Eos: 2 %
Hematocrit: 43.6 % (ref 37.5–51.0)
Hemoglobin: 15.1 g/dL (ref 13.0–17.7)
Immature Grans (Abs): 0.1 10*3/uL (ref 0.0–0.1)
Immature Granulocytes: 1 %
Lymphocytes Absolute: 2.7 10*3/uL (ref 0.7–3.1)
Lymphs: 21 %
MCH: 31.9 pg (ref 26.6–33.0)
MCHC: 34.6 g/dL (ref 31.5–35.7)
MCV: 92 fL (ref 79–97)
Monocytes Absolute: 1.1 10*3/uL — ABNORMAL HIGH (ref 0.1–0.9)
Monocytes: 8 %
Neutrophils Absolute: 8.7 10*3/uL — ABNORMAL HIGH (ref 1.4–7.0)
Neutrophils: 67 %
Platelets: 408 10*3/uL (ref 150–450)
RBC: 4.73 x10E6/uL (ref 4.14–5.80)
RDW: 12 % (ref 11.6–15.4)
WBC: 12.8 10*3/uL — ABNORMAL HIGH (ref 3.4–10.8)

## 2019-09-07 LAB — TSH: TSH: 2.59 u[IU]/mL (ref 0.450–4.500)

## 2019-09-07 LAB — T4, FREE: Free T4: 1.27 ng/dL (ref 0.82–1.77)

## 2019-09-07 LAB — VITAMIN B12: Vitamin B-12: 580 pg/mL (ref 232–1245)

## 2019-09-07 LAB — LIPID PANEL
Chol/HDL Ratio: 5.6 ratio — ABNORMAL HIGH (ref 0.0–5.0)
Cholesterol, Total: 179 mg/dL (ref 100–199)
HDL: 32 mg/dL — ABNORMAL LOW (ref >39)
LDL Chol Calc (NIH): 114 mg/dL — ABNORMAL HIGH (ref 0–99)
Triglycerides: 189 mg/dL — ABNORMAL HIGH (ref 0–149)
VLDL Cholesterol Cal: 33 mg/dL (ref 5–40)

## 2019-09-07 MED ORDER — VITAMIN D (ERGOCALCIFEROL) 1.25 MG (50000 UNIT) PO CAPS: 50000.0000 [IU] | ORAL_CAPSULE | ORAL | 0 refills | Status: DC

## 2019-09-07 NOTE — Progress Notes (Signed)
Please ask Hector Byrd if she can add a fractionated alkaline phosphatase due to elevated serum alk phosphatase

## 2019-09-08 ENCOUNTER — Encounter: Payer: Self-pay | Admitting: Family Medicine

## 2019-09-10 NOTE — Progress Notes (Signed)
Please ask him if he has ever seen GI for his high alkaline phosphatase in the past? When did he last have a colonoscopy? We will recheck this lab after getting his vitamin D level closer to normal. I sent in the vitamin D and he should start taking it once weekly as prescribed.

## 2019-09-14 ENCOUNTER — Ambulatory Visit (INDEPENDENT_AMBULATORY_CARE_PROVIDER_SITE_OTHER): Payer: Federal, State, Local not specified - PPO | Admitting: *Deleted

## 2019-09-14 DIAGNOSIS — I255 Ischemic cardiomyopathy: Secondary | ICD-10-CM

## 2019-09-14 LAB — CUP PACEART REMOTE DEVICE CHECK
Date Time Interrogation Session: 20210831095855
Implantable Lead Implant Date: 20141124
Implantable Lead Location: 753860
Implantable Lead Model: 365
Implantable Lead Serial Number: 10552343
Implantable Pulse Generator Implant Date: 20141124
Pulse Gen Model: 383594
Pulse Gen Serial Number: 60740583

## 2019-09-15 LAB — ALKALINE PHOSPHATASE, ISOENZYMES
Alkaline Phosphatase: 187 IU/L — ABNORMAL HIGH (ref 48–121)
BONE FRACTION: 21 % (ref 12–68)
INTESTINAL FRAC.: 0 % (ref 0–18)
LIVER FRACTION: 79 % (ref 13–88)

## 2019-09-15 LAB — SPECIMEN STATUS REPORT

## 2019-09-15 NOTE — Progress Notes (Signed)
Remote ICD transmission.   

## 2019-09-16 ENCOUNTER — Ambulatory Visit
Admission: RE | Admit: 2019-09-16 | Discharge: 2019-09-16 | Disposition: A | Discharge status: Home / Self Care | Payer: Federal, State, Local not specified - PPO | Source: Ambulatory Visit | Attending: Family Medicine | Admitting: Family Medicine

## 2019-09-16 DIAGNOSIS — J439 Emphysema, unspecified: Secondary | ICD-10-CM

## 2019-09-16 DIAGNOSIS — F17200 Nicotine dependence, unspecified, uncomplicated: Secondary | ICD-10-CM

## 2019-09-16 DIAGNOSIS — I255 Ischemic cardiomyopathy: Secondary | ICD-10-CM

## 2019-09-16 DIAGNOSIS — R0609 Other forms of dyspnea: Secondary | ICD-10-CM

## 2019-09-16 DIAGNOSIS — R0989 Other specified symptoms and signs involving the circulatory and respiratory systems: Secondary | ICD-10-CM

## 2019-09-16 DIAGNOSIS — I5042 Chronic combined systolic (congestive) and diastolic (congestive) heart failure: Secondary | ICD-10-CM

## 2019-09-16 DIAGNOSIS — I2510 Atherosclerotic heart disease of native coronary artery without angina pectoris: Secondary | ICD-10-CM

## 2019-09-21 ENCOUNTER — Emergency Department (HOSPITAL_COMMUNITY)
Admission: EM | Admit: 2019-09-21 | Discharge: 2019-09-22 | Disposition: A | Discharge status: Home / Self Care | Payer: Federal, State, Local not specified - PPO | Attending: Emergency Medicine | Admitting: Emergency Medicine

## 2019-09-21 ENCOUNTER — Encounter (HOSPITAL_COMMUNITY): Payer: Self-pay

## 2019-09-21 DIAGNOSIS — Z7982 Long term (current) use of aspirin: Secondary | ICD-10-CM | POA: Insufficient documentation

## 2019-09-21 DIAGNOSIS — Z79899 Other long term (current) drug therapy: Secondary | ICD-10-CM | POA: Insufficient documentation

## 2019-09-21 DIAGNOSIS — Z85528 Personal history of other malignant neoplasm of kidney: Secondary | ICD-10-CM | POA: Insufficient documentation

## 2019-09-21 DIAGNOSIS — Z95810 Presence of automatic (implantable) cardiac defibrillator: Secondary | ICD-10-CM | POA: Insufficient documentation

## 2019-09-21 DIAGNOSIS — I5042 Chronic combined systolic (congestive) and diastolic (congestive) heart failure: Secondary | ICD-10-CM | POA: Insufficient documentation

## 2019-09-21 DIAGNOSIS — Z951 Presence of aortocoronary bypass graft: Secondary | ICD-10-CM | POA: Diagnosis not present

## 2019-09-21 DIAGNOSIS — I110 Hypertensive heart disease with heart failure: Secondary | ICD-10-CM | POA: Insufficient documentation

## 2019-09-21 DIAGNOSIS — Y93I9 Activity, other involving external motion: Secondary | ICD-10-CM | POA: Diagnosis not present

## 2019-09-21 DIAGNOSIS — I2510 Atherosclerotic heart disease of native coronary artery without angina pectoris: Secondary | ICD-10-CM | POA: Diagnosis not present

## 2019-09-21 DIAGNOSIS — F17210 Nicotine dependence, cigarettes, uncomplicated: Secondary | ICD-10-CM | POA: Insufficient documentation

## 2019-09-21 DIAGNOSIS — Y929 Unspecified place or not applicable: Secondary | ICD-10-CM | POA: Insufficient documentation

## 2019-09-21 DIAGNOSIS — G8911 Acute pain due to trauma: Secondary | ICD-10-CM | POA: Insufficient documentation

## 2019-09-21 DIAGNOSIS — M2560 Stiffness of unspecified joint, not elsewhere classified: Secondary | ICD-10-CM | POA: Diagnosis not present

## 2019-09-21 DIAGNOSIS — Z041 Encounter for examination and observation following transport accident: Secondary | ICD-10-CM | POA: Insufficient documentation

## 2019-09-21 DIAGNOSIS — Y999 Unspecified external cause status: Secondary | ICD-10-CM | POA: Insufficient documentation
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
## 2019-10-13 ENCOUNTER — Telehealth: Payer: Self-pay

## 2019-10-13 ENCOUNTER — Encounter: Payer: Self-pay | Admitting: Family Medicine

## 2019-10-13 ENCOUNTER — Other Ambulatory Visit: Payer: Self-pay

## 2019-10-13 ENCOUNTER — Telehealth (INDEPENDENT_AMBULATORY_CARE_PROVIDER_SITE_OTHER): Payer: Federal, State, Local not specified - PPO | Admitting: Family Medicine

## 2019-10-13 VITALS — Temp 99.0°F

## 2019-10-13 DIAGNOSIS — F419 Anxiety disorder, unspecified: Secondary | ICD-10-CM

## 2019-10-13 DIAGNOSIS — I255 Ischemic cardiomyopathy: Secondary | ICD-10-CM

## 2019-10-13 DIAGNOSIS — R509 Fever, unspecified: Secondary | ICD-10-CM

## 2019-10-13 DIAGNOSIS — R05 Cough: Secondary | ICD-10-CM

## 2019-10-13 DIAGNOSIS — R059 Cough, unspecified: Secondary | ICD-10-CM

## 2019-10-13 NOTE — Telephone Encounter (Signed)
Pt. Called said he left a message yesterday not sure with who he seemed confused. He said he has not eaten in a few days, can't sleep well sleeping about 5 hours nightly, and just has total anxiety. He is not sure what to do has no family here for him.
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
## 2019-10-13 NOTE — Telephone Encounter (Signed)
We will discuss this at his appt

## 2019-10-21 ENCOUNTER — Encounter: Payer: Self-pay | Admitting: Family Medicine

## 2019-10-23 ENCOUNTER — Other Ambulatory Visit: Payer: Self-pay | Admitting: Cardiology

## 2019-10-28 ENCOUNTER — Encounter: Payer: Self-pay | Admitting: Family Medicine

## 2019-11-03 ENCOUNTER — Encounter: Payer: Self-pay | Admitting: Family Medicine

## 2019-11-03 ENCOUNTER — Ambulatory Visit: Payer: Federal, State, Local not specified - PPO | Admitting: Family Medicine

## 2019-11-03 DIAGNOSIS — I10 Essential (primary) hypertension: Secondary | ICD-10-CM | POA: Diagnosis not present

## 2019-11-03 DIAGNOSIS — F32A Depression, unspecified: Secondary | ICD-10-CM | POA: Diagnosis not present

## 2019-11-03 DIAGNOSIS — I5042 Chronic combined systolic (congestive) and diastolic (congestive) heart failure: Secondary | ICD-10-CM | POA: Diagnosis not present

## 2019-11-03 DIAGNOSIS — E559 Vitamin D deficiency, unspecified: Secondary | ICD-10-CM

## 2019-11-03 DIAGNOSIS — Z95810 Presence of automatic (implantable) cardiac defibrillator: Secondary | ICD-10-CM | POA: Diagnosis not present

## 2019-11-03 DIAGNOSIS — R748 Abnormal levels of other serum enzymes: Secondary | ICD-10-CM
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
## 2019-11-04 ENCOUNTER — Encounter: Payer: Self-pay | Admitting: Family Medicine

## 2019-11-04 ENCOUNTER — Telehealth: Payer: Self-pay | Admitting: Internal Medicine

## 2019-11-04 ENCOUNTER — Other Ambulatory Visit: Payer: Self-pay | Admitting: Internal Medicine

## 2019-11-04 DIAGNOSIS — R748 Abnormal levels of other serum enzymes: Secondary | ICD-10-CM

## 2019-11-04 DIAGNOSIS — K760 Fatty (change of) liver, not elsewhere classified: Secondary | ICD-10-CM

## 2019-11-04 HISTORY — DX: Fatty (change of) liver, not elsewhere classified: K76.0

## 2019-11-04 LAB — COMPREHENSIVE METABOLIC PANEL
ALT: 10 IU/L (ref 0–44)
AST: 14 IU/L (ref 0–40)
Albumin/Globulin Ratio: 1.4 (ref 1.2–2.2)
Albumin: 4 g/dL (ref 3.8–4.8)
Alkaline Phosphatase: 176 IU/L — ABNORMAL HIGH (ref 44–121)
BUN/Creatinine Ratio: 12 (ref 10–24)
BUN: 13 mg/dL (ref 8–27)
Bilirubin Total: 0.2 mg/dL (ref 0.0–1.2)
CO2: 21 mmol/L (ref 20–29)
Calcium: 9.5 mg/dL (ref 8.6–10.2)
Chloride: 103 mmol/L (ref 96–106)
Creatinine, Ser: 1.07 mg/dL (ref 0.76–1.27)
GFR calc Af Amer: 83 mL/min/{1.73_m2} (ref >59)
GFR calc non Af Amer: 72 mL/min/{1.73_m2} (ref >59)
Globulin, Total: 2.9 g/dL (ref 1.5–4.5)
Glucose: 68 mg/dL (ref 65–99)
Potassium: 5 mmol/L (ref 3.5–5.2)
Sodium: 141 mmol/L (ref 134–144)
Total Protein: 6.9 g/dL (ref 6.0–8.5)

## 2019-11-04 LAB — VITAMIN D 25 HYDROXY (VIT D DEFICIENCY, FRACTURES): Vit D, 25-Hydroxy: 22.9 ng/mL — ABNORMAL LOW (ref 30.0–100.0)
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
## 2019-11-04 NOTE — Progress Notes (Signed)
Please refer him to GI due to elevated alkaline phosphatase and fatty liver disease.

## 2019-11-04 NOTE — Telephone Encounter (Signed)
Looks like Dr. Radford Pax may be primary cardiologist.

## 2019-11-05 NOTE — Telephone Encounter (Signed)
He needs an OV to fill out form - may need to get in with PA in the next week

## 2019-11-05 NOTE — Telephone Encounter (Signed)
Spoke with the patient and advised him that per Dr. Radford Pax he needs an OV before paperwork can be filled out. I have scheduled him to see Melina Copa, PA-C on 10/26.

## 2019-11-07 ENCOUNTER — Encounter: Payer: Self-pay | Admitting: Physician Assistant
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
## 2019-11-09 ENCOUNTER — Other Ambulatory Visit: Payer: Self-pay

## 2019-11-09 ENCOUNTER — Encounter: Payer: Self-pay | Admitting: Physician Assistant

## 2019-11-09 ENCOUNTER — Ambulatory Visit (INDEPENDENT_AMBULATORY_CARE_PROVIDER_SITE_OTHER): Payer: Federal, State, Local not specified - PPO | Admitting: Physician Assistant

## 2019-11-09 VITALS — BP 132/80 | HR 101 | Ht 67.0 in | Wt 148.0 lb

## 2019-11-09 DIAGNOSIS — R000 Tachycardia, unspecified: Secondary | ICD-10-CM

## 2019-11-09 DIAGNOSIS — I5042 Chronic combined systolic (congestive) and diastolic (congestive) heart failure: Secondary | ICD-10-CM | POA: Diagnosis not present

## 2019-11-09 DIAGNOSIS — Z8679 Personal history of other diseases of the circulatory system: Secondary | ICD-10-CM

## 2019-11-09 DIAGNOSIS — Z87828 Personal history of other (healed) physical injury and trauma: Secondary | ICD-10-CM | POA: Diagnosis not present

## 2019-11-09 DIAGNOSIS — I255 Ischemic cardiomyopathy: Secondary | ICD-10-CM

## 2019-11-09 DIAGNOSIS — I2510 Atherosclerotic heart disease of native coronary artery without angina pectoris: Secondary | ICD-10-CM

## 2019-11-09 DIAGNOSIS — M79604 Pain in right leg: Secondary | ICD-10-CM

## 2019-11-09 DIAGNOSIS — I77810 Thoracic aortic ectasia: Secondary | ICD-10-CM

## 2019-11-09 MED ORDER — METOPROLOL SUCCINATE ER 50 MG PO TB24: 50.0000 mg | ORAL_TABLET | Freq: Every day | ORAL | 3 refills | Status: DC

## 2019-11-09 MED ORDER — ATORVASTATIN CALCIUM 40 MG PO TABS: 40.0000 mg | ORAL_TABLET | Freq: Every day | ORAL | 0 refills | Status: DC

## 2019-11-09 NOTE — Patient Instructions (Addendum)
Medication Instructions:  Your physician has recommended you make the following change in your medication:  1.  RESTART Atorvastatin 40 mg taking 1 daily 2.  INCREASE the Metoprolol to 25 mg taking 1 daily  *If you need a refill on your cardiac medications before your next appointment, please call your pharmacy*   Lab Work: TODAY:  BMET, CBC, & TSH   If you have labs (blood work) drawn today and your tests are completely normal, you will receive your results only by: Marland Kitchen MyChart Message (if you have MyChart) OR . A paper copy in the mail If you have any lab test that is abnormal or we need to change your treatment, we will call you to review the results.   Testing/Procedures: Your physician has requested that you have an echocardiogram. Echocardiography is a painless test that uses sound waves to create images of your heart. It provides your doctor with information about the size and shape of your heart and how well your heart's chambers and valves are working. This procedure takes approximately one hour. There are no restrictions for this procedure.   Your physician has requested that you have a lower extremity arterial exercise duplex W/ ABI'S. During this test, exercise and ultrasound are used to evaluate arterial blood flow in the legs. Allow one hour for this exam. There are no restrictions or special instructions.    Follow-Up: At [facility], you and your health needs are our priority.  As part of our continuing mission to provide you with exceptional heart care, we have created designated Provider Care Teams.  These Care Teams include your primary Cardiologist (physician) and Advanced Practice Providers (APPs -  Physician Assistants and Nurse Practitioners) who all work together to provide you with the care you need, when you need it.  We recommend signing up for the patient portal called "MyChart".  Sign up information is provided on this After Visit Summary.  MyChart is used  to connect with patients for Virtual Visits (Telemedicine).  Patients are able to view lab/test results, encounter notes, upcoming appointments, etc.  Non-urgent messages can be sent to your provider as well.   To learn more about what you can do with MyChart, go to [email or web address].    Your next appointment:   2 weeks   The format for your next appointment:   In Person  Provider:   You may see Fransico Him, MD or one of the following Advanced Practice Providers on your designated Care Team:    Melina Copa, PA-C  Ermalinda Barrios, PA-C    Other Instructions

## 2019-11-09 NOTE — Addendum Note (Signed)
Addended by: Gaetano Net on: 11/09/2019 03:57 PM   Modules accepted: Orders

## 2019-11-10 LAB — BASIC METABOLIC PANEL
BUN/Creatinine Ratio: 16 (ref 10–24)
BUN: 16 mg/dL (ref 8–27)
CO2: 25 mmol/L (ref 20–29)
Calcium: 9.9 mg/dL (ref 8.6–10.2)
Chloride: 105 mmol/L (ref 96–106)
Creatinine, Ser: 1.01 mg/dL (ref 0.76–1.27)
GFR calc Af Amer: 89 mL/min/{1.73_m2} (ref >59)
GFR calc non Af Amer: 77 mL/min/{1.73_m2} (ref >59)
Glucose: 62 mg/dL — ABNORMAL LOW (ref 65–99)
Potassium: 5 mmol/L (ref 3.5–5.2)
Sodium: 144 mmol/L (ref 134–144)

## 2019-11-10 LAB — CBC
Hematocrit: 43.4 % (ref 37.5–51.0)
Hemoglobin: 15.1 g/dL (ref 13.0–17.7)
MCH: 31.5 pg (ref 26.6–33.0)
MCHC: 34.8 g/dL (ref 31.5–35.7)
MCV: 91 fL (ref 79–97)
Platelets: 387 10*3/uL (ref 150–450)
RBC: 4.79 x10E6/uL (ref 4.14–5.80)
RDW: 13.3 % (ref 11.6–15.4)
WBC: 13.3 10*3/uL — ABNORMAL HIGH (ref 3.4–10.8)

## 2019-11-10 LAB — TSH: TSH: 2.13 u[IU]/mL (ref 0.450–4.500)

## 2019-11-15 ENCOUNTER — Ambulatory Visit (HOSPITAL_COMMUNITY): Payer: Federal, State, Local not specified - PPO | Attending: Cardiology

## 2019-11-15 ENCOUNTER — Other Ambulatory Visit: Payer: Self-pay

## 2019-11-15 DIAGNOSIS — I5042 Chronic combined systolic (congestive) and diastolic (congestive) heart failure: Secondary | ICD-10-CM | POA: Diagnosis present

## 2019-11-15 DIAGNOSIS — I255 Ischemic cardiomyopathy: Secondary | ICD-10-CM | POA: Diagnosis present

## 2019-11-15 DIAGNOSIS — Z87828 Personal history of other (healed) physical injury and trauma: Secondary | ICD-10-CM | POA: Diagnosis not present

## 2019-11-15 DIAGNOSIS — R000 Tachycardia, unspecified: Secondary | ICD-10-CM

## 2019-11-15 DIAGNOSIS — I77810 Thoracic aortic ectasia: Secondary | ICD-10-CM | POA: Diagnosis present

## 2019-11-15 DIAGNOSIS — I2510 Atherosclerotic heart disease of native coronary artery without angina pectoris: Secondary | ICD-10-CM | POA: Diagnosis present

## 2019-11-15 DIAGNOSIS — M79604 Pain in right leg: Secondary | ICD-10-CM | POA: Diagnosis present

## 2019-11-15 DIAGNOSIS — Z8679 Personal history of other diseases of the circulatory system: Secondary | ICD-10-CM

## 2019-11-15 LAB — ECHOCARDIOGRAM COMPLETE
Area-P 1/2: 3.91 cm2
P 1/2 time: 306 msec
S' Lateral: 3.6 cm

## 2019-11-16 NOTE — Telephone Encounter (Signed)
See result note. 2D echo relatively stable but want to know vital signs.

## 2019-11-18 ENCOUNTER — Telehealth: Payer: Self-pay | Admitting: Licensed Clinical Social Worker

## 2019-11-18 NOTE — Telephone Encounter (Signed)
CSW referred to assist patient with obtaining a BP cuff, transportation and other financial resources. CSW contacted patient to follow up on needs and left message to return call and informed cuff will be delivered to home. CSW will await return call to address transportation and financial needs. Raquel Sarna, [place], [place]

## 2019-11-19 ENCOUNTER — Telehealth: Payer: Self-pay | Admitting: Licensed Clinical Social Worker

## 2019-11-19 NOTE — Telephone Encounter (Signed)
Replied to pt in other [place].

## 2019-11-19 NOTE — Telephone Encounter (Signed)
CSW attempted to reach patient to provide resources for transportation and financial. Message left. Raquel Sarna, [place], [place]

## 2019-11-23 ENCOUNTER — Other Ambulatory Visit: Payer: Self-pay | Admitting: Family Medicine

## 2019-11-23 DIAGNOSIS — E559 Vitamin D deficiency, unspecified: Secondary | ICD-10-CM

## 2019-11-24 ENCOUNTER — Encounter: Payer: Self-pay | Admitting: Cardiology

## 2019-11-24 ENCOUNTER — Ambulatory Visit (INDEPENDENT_AMBULATORY_CARE_PROVIDER_SITE_OTHER): Payer: Federal, State, Local not specified - PPO | Admitting: Cardiology

## 2019-11-24 ENCOUNTER — Encounter: Payer: Self-pay | Admitting: Student

## 2019-11-24 ENCOUNTER — Other Ambulatory Visit: Payer: Self-pay

## 2019-11-24 ENCOUNTER — Ambulatory Visit (INDEPENDENT_AMBULATORY_CARE_PROVIDER_SITE_OTHER): Payer: Federal, State, Local not specified - PPO | Admitting: Student

## 2019-11-24 VITALS — BP 142/70 | HR 102 | Ht 67.0 in | Wt 150.8 lb

## 2019-11-24 VITALS — BP 130/60 | HR 80 | Ht 68.0 in | Wt 149.4 lb

## 2019-11-24 DIAGNOSIS — I712 Thoracic aortic aneurysm, without rupture, unspecified: Secondary | ICD-10-CM

## 2019-11-24 DIAGNOSIS — I472 Ventricular tachycardia, unspecified: Secondary | ICD-10-CM

## 2019-11-24 DIAGNOSIS — I351 Nonrheumatic aortic (valve) insufficiency: Secondary | ICD-10-CM | POA: Insufficient documentation

## 2019-11-24 DIAGNOSIS — I5042 Chronic combined systolic (congestive) and diastolic (congestive) heart failure: Secondary | ICD-10-CM

## 2019-11-24 DIAGNOSIS — I2510 Atherosclerotic heart disease of native coronary artery without angina pectoris: Secondary | ICD-10-CM | POA: Diagnosis not present

## 2019-11-24 DIAGNOSIS — I10 Essential (primary) hypertension: Secondary | ICD-10-CM | POA: Diagnosis not present

## 2019-11-24 DIAGNOSIS — I255 Ischemic cardiomyopathy: Secondary | ICD-10-CM

## 2019-11-24 DIAGNOSIS — Z87828 Personal history of other (healed) physical injury and trauma: Secondary | ICD-10-CM

## 2019-11-24 DIAGNOSIS — E7800 Pure hypercholesterolemia, unspecified: Secondary | ICD-10-CM

## 2019-11-24 LAB — CUP PACEART INCLINIC DEVICE CHECK
Battery Voltage: 3.08 V
Brady Statistic RV Percent Paced: 0 %
Date Time Interrogation Session: 20211110141821
HighPow Impedance: 74 Ohm
Implantable Lead Implant Date: 20141124
Implantable Lead Location: 753860
Implantable Lead Model: 365
Implantable Lead Serial Number: 10552343
Implantable Pulse Generator Implant Date: 20141124
Lead Channel Impedance Value: 419 Ohm
Lead Channel Pacing Threshold Amplitude: 1.3 V
Lead Channel Pacing Threshold Pulse Width: 0.4 ms
Lead Channel Sensing Intrinsic Amplitude: 12.4 mV
Lead Channel Sensing Intrinsic Amplitude: 16 mV
Lead Channel Setting Pacing Amplitude: 2.5 V
Lead Channel Setting Pacing Pulse Width: 0.4 ms
Lead Channel Setting Sensing Sensitivity: 0.8 mV
Pulse Gen Model: 383594
Pulse Gen Serial Number: 60740583

## 2019-11-24 MED ORDER — ATORVASTATIN CALCIUM 40 MG PO TABS
40.0000 mg | ORAL_TABLET | Freq: Every day | ORAL | 3 refills | Status: DC
Start: 2019-11-24 — End: 2019-11-24

## 2019-11-24 MED ORDER — LISINOPRIL 5 MG PO TABS
5.0000 mg | ORAL_TABLET | Freq: Every day | ORAL | 3 refills | Status: DC
Start: 2019-11-24 — End: 2020-11-27

## 2019-11-24 MED ORDER — ATORVASTATIN CALCIUM 80 MG PO TABS: 80.0000 mg | ORAL_TABLET | Freq: Every day | ORAL | 3 refills | Status: DC
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
## 2019-11-24 NOTE — Patient Instructions (Addendum)
Medication Instructions:  Your physician has recommended you make the following change in your medication:  1) INCREASE atorvastatin to 80 mg daily  *If you need a refill on your cardiac medications before your next appointment, please call your pharmacy*  Lab Work: Fasting lipids and ALT in 6 weeks  If you have labs (blood work) drawn today and your tests are completely normal, you will receive your results only by: Marland Kitchen MyChart Message (if you have MyChart) OR . A paper copy in the mail If you have any lab test that is abnormal or we need to change your treatment, we will call you to review the results.   Testing/Procedures: Your physician has requested that you have an echocardiogram in one year. Echocardiography is a painless test that uses sound waves to create images of your heart. It provides your doctor with information about the size and shape of your heart and how well your heart's chambers and valves are working. This procedure takes approximately one hour. There are no restrictions for this procedure.  Follow-Up: At [facility], you and your health needs are our priority.  As part of our continuing mission to provide you with exceptional heart care, we have created designated Provider Care Teams.  These Care Teams include your primary Cardiologist (physician) and Advanced Practice Providers (APPs -  Physician Assistants and Nurse Practitioners) who all work together to provide you with the care you need, when you need it.  Your next appointment:   6 month(s)  The format for your next appointment:   In Person  Provider:   You may see Fransico Him, MD or one of the following Advanced Practice Providers on your designated Care Team:    Melina Copa, PA-C  Ermalinda Barrios, PA-C

## 2019-11-24 NOTE — Patient Instructions (Signed)
Medication Instructions:  *If you need a refill on your cardiac medications before your next appointment, please call your pharmacy*  Follow-Up: At [facility], you and your health needs are our priority.  As part of our continuing mission to provide you with exceptional heart care, we have created designated Provider Care Teams.  These Care Teams include your primary Cardiologist (physician) and Advanced Practice Providers (APPs -  Physician Assistants and Nurse Practitioners) who all work together to provide you with the care you need, when you need it.  We recommend signing up for the patient portal called "MyChart".  Sign up information is provided on this After Visit Summary.  MyChart is used to connect with patients for Virtual Visits (Telemedicine).  Patients are able to view lab/test results, encounter notes, upcoming appointments, etc.  Non-urgent messages can be sent to your provider as well.   To learn more about what you can do with MyChart, go to [email or web address].    Your next appointment:   Your physician wants you to follow-up in: 1 YEAR with Dr. Lovena Le. You will receive a reminder letter in the mail two months in advance. If you don't receive a letter, please call our office to schedule the follow-up appointment.  Remote monitoring is used to monitor your ICD from home. This monitoring reduces the number of office visits required to check your device to one time per year. It allows [place] to keep an eye on the functioning of your device to ensure it is working properly. You are scheduled for a device check from home on 12/14/19. You may send your transmission at any time that day. If you have a wireless device, the transmission will be sent automatically. After your physician reviews your transmission, you will receive a postcard with your next transmission date.  The format for your next appointment:   In Person with Cristopher Peru, MD

## 2019-11-29 ENCOUNTER — Other Ambulatory Visit (HOSPITAL_COMMUNITY): Payer: Federal, State, Local not specified - PPO

## 2019-12-06 ENCOUNTER — Encounter: Payer: Self-pay | Admitting: Family Medicine

## 2019-12-06 ENCOUNTER — Ambulatory Visit (HOSPITAL_COMMUNITY)
Admission: RE | Admit: 2019-12-06 | Payer: Federal, State, Local not specified - PPO | Source: Ambulatory Visit | Attending: Physician Assistant | Admitting: Physician Assistant

## 2019-12-14 ENCOUNTER — Ambulatory Visit (INDEPENDENT_AMBULATORY_CARE_PROVIDER_SITE_OTHER): Payer: Federal, State, Local not specified - PPO

## 2019-12-14 DIAGNOSIS — I255 Ischemic cardiomyopathy: Secondary | ICD-10-CM | POA: Diagnosis not present

## 2019-12-14 LAB — CUP PACEART REMOTE DEVICE CHECK
Date Time Interrogation Session: 20211129070219
Implantable Lead Implant Date: 20141124
Implantable Lead Location: 753860
Implantable Lead Model: 365
Implantable Lead Serial Number: 10552343
Implantable Pulse Generator Implant Date: 20141124
Pulse Gen Model: 383594
Pulse Gen Serial Number: 60740583

## 2019-12-20 ENCOUNTER — Other Ambulatory Visit: Payer: Self-pay | Admitting: Internal Medicine

## 2019-12-20 ENCOUNTER — Other Ambulatory Visit: Payer: Self-pay | Admitting: Physician Assistant

## 2019-12-20 DIAGNOSIS — I255 Ischemic cardiomyopathy: Secondary | ICD-10-CM

## 2019-12-20 DIAGNOSIS — I5042 Chronic combined systolic (congestive) and diastolic (congestive) heart failure: Secondary | ICD-10-CM

## 2019-12-20 DIAGNOSIS — R0989 Other specified symptoms and signs involving the circulatory and respiratory systems: Secondary | ICD-10-CM

## 2019-12-20 DIAGNOSIS — Z8679 Personal history of other diseases of the circulatory system: Secondary | ICD-10-CM

## 2019-12-20 DIAGNOSIS — I2510 Atherosclerotic heart disease of native coronary artery without angina pectoris: Secondary | ICD-10-CM

## 2019-12-20 DIAGNOSIS — M79604 Pain in right leg: Secondary | ICD-10-CM

## 2019-12-20 DIAGNOSIS — I77810 Thoracic aortic ectasia: Secondary | ICD-10-CM

## 2019-12-20 DIAGNOSIS — R000 Tachycardia, unspecified: Secondary | ICD-10-CM

## 2019-12-20 NOTE — Progress Notes (Signed)
Remote ICD transmission.   

## 2019-12-21 ENCOUNTER — Ambulatory Visit (HOSPITAL_COMMUNITY)
Admission: RE | Admit: 2019-12-21 | Discharge: 2019-12-21 | Disposition: A | Discharge status: Home / Self Care | Payer: Federal, State, Local not specified - PPO | Source: Ambulatory Visit | Attending: Cardiology | Admitting: Cardiology

## 2019-12-21 ENCOUNTER — Other Ambulatory Visit: Payer: Self-pay

## 2019-12-21 DIAGNOSIS — I2510 Atherosclerotic heart disease of native coronary artery without angina pectoris: Secondary | ICD-10-CM

## 2019-12-21 DIAGNOSIS — R0989 Other specified symptoms and signs involving the circulatory and respiratory systems: Secondary | ICD-10-CM

## 2019-12-21 DIAGNOSIS — I77810 Thoracic aortic ectasia: Secondary | ICD-10-CM

## 2019-12-21 DIAGNOSIS — I255 Ischemic cardiomyopathy: Secondary | ICD-10-CM

## 2019-12-21 DIAGNOSIS — Z87828 Personal history of other (healed) physical injury and trauma: Secondary | ICD-10-CM

## 2019-12-21 DIAGNOSIS — I5042 Chronic combined systolic (congestive) and diastolic (congestive) heart failure: Secondary | ICD-10-CM

## 2019-12-21 DIAGNOSIS — M79604 Pain in right leg: Secondary | ICD-10-CM

## 2019-12-21 DIAGNOSIS — R000 Tachycardia, unspecified: Secondary | ICD-10-CM | POA: Diagnosis present

## 2019-12-21 DIAGNOSIS — Z8679 Personal history of other diseases of the circulatory system: Secondary | ICD-10-CM | POA: Diagnosis present

## 2019-12-22 ENCOUNTER — Encounter: Payer: Self-pay | Admitting: Physician Assistant

## 2019-12-29 ENCOUNTER — Other Ambulatory Visit: Payer: Self-pay

## 2019-12-29 DIAGNOSIS — M79604 Pain in right leg: Secondary | ICD-10-CM

## 2019-12-29 DIAGNOSIS — I714 Abdominal aortic aneurysm, without rupture, unspecified: Secondary | ICD-10-CM

## 2019-12-29 DIAGNOSIS — I77810 Thoracic aortic ectasia: Secondary | ICD-10-CM

## 2020-01-11 ENCOUNTER — Encounter: Payer: Self-pay | Admitting: *Deleted

## 2020-01-12 ENCOUNTER — Other Ambulatory Visit: Payer: Federal, State, Local not specified - PPO

## 2020-01-12 ENCOUNTER — Telehealth: Payer: Self-pay | Admitting: Cardiology

## 2020-01-12 NOTE — Telephone Encounter (Signed)
Patient is returning call to discuss lower extremity arterial results.

## 2020-01-12 NOTE — Telephone Encounter (Signed)
Spoke with the patient and reviewed his results from his lower extremity ultrasound. Patient is aware that someone will be in touch with him to set up an appointment with Dr. Kirke Corin or Dr. Allyson Sabal.  Patient verbalized understanding.

## 2020-01-17 ENCOUNTER — Ambulatory Visit (HOSPITAL_COMMUNITY): Payer: Federal, State, Local not specified - PPO

## 2020-01-20 ENCOUNTER — Telehealth: Payer: Self-pay | Admitting: Cardiology

## 2020-01-20 NOTE — Telephone Encounter (Signed)
See phone note

## 2020-01-20 NOTE — Telephone Encounter (Signed)
Spoke with the patient and advised him that the AAA duplex was inadvertently scheduled and was not due to be done for another year. Explained that this test is to follow the abdominal aortic aneurysm that was seen on his ultrasound.    Advised him that he was referred to the peripheral vascular team to follow the blockages in his legs that were seen on the ultrasound.   Patient verbalized understanding

## 2020-01-20 NOTE — Telephone Encounter (Signed)
Patient called upset that his AAA duplex was cancelled. I informed him it was cancelled, because the order states he is not due for the test until December of 2022. Patient states this does not make since, because he found out about a leakage from a previous test recently from [place]. Patient was frustrated and wants to know who authorized the appointment being cancelled.

## 2020-01-21 ENCOUNTER — Telehealth: Payer: Self-pay

## 2020-01-21 ENCOUNTER — Ambulatory Visit: Payer: Federal, State, Local not specified - PPO | Admitting: Cardiology

## 2020-01-21 NOTE — Telephone Encounter (Signed)
LM of cancelled appt 01/21/20   resch for 02-15-20 at 11:00 am..Marland KitchenMarland KitchenThird message  01/21/20  VB

## 2020-01-21 NOTE — Telephone Encounter (Signed)
LM that the Patient's appt was for 01-21-20 at 2:20 w Hochrein.Marland Kitchenresch for 02-15-20, 11:00 w Fletcher Anon, will continue to try and reach patient. 01-21-20  VB

## 2020-01-28 ENCOUNTER — Inpatient Hospital Stay (HOSPITAL_COMMUNITY): Admission: RE | Admit: 2020-01-28 | Payer: Federal, State, Local not specified - PPO | Source: Ambulatory Visit

## 2020-02-15 ENCOUNTER — Ambulatory Visit: Payer: Federal, State, Local not specified - PPO | Admitting: Cardiovascular Disease

## 2020-02-22 ENCOUNTER — Telehealth: Payer: Self-pay | Admitting: Cardiovascular Disease

## 2020-02-22 ENCOUNTER — Ambulatory Visit: Payer: Federal, State, Local not specified - PPO | Admitting: Cardiovascular Disease

## 2020-02-22 NOTE — Telephone Encounter (Signed)
Spoke to patient he stated his PCP wanted him to see Dr.Arida for right leg pain.Stated he has had 3 blood clots in right leg.Stated he cancelled appointment with Dr.Arida he needs a pm appointment.Advised Dr.Arida only has am appointments.Appointment rescheduled with Dr.Berry 03/24/20 at 1:45 pm.

## 2020-02-22 NOTE — Telephone Encounter (Signed)
I reached out to [facility] to reschedule his NP appointment with Dr. Fletcher Anon due to receiving a message via patient advice request from Hector Byrd advising to make the call. After speaking with Hector Byrd he stated he is unable to drive so it is hard for him to be sure of a ride and is the reason behind his recent cancellations. Due to his health conditions he states it is exteremly hard for him to make morning appointments. He is wanting to know if he can be worked in for an afternoon appointment. He states if is ride is not able to take him he will take a taxi. Please advise.

## 2020-03-14 ENCOUNTER — Ambulatory Visit (INDEPENDENT_AMBULATORY_CARE_PROVIDER_SITE_OTHER): Payer: Federal, State, Local not specified - PPO

## 2020-03-14 DIAGNOSIS — I255 Ischemic cardiomyopathy: Secondary | ICD-10-CM | POA: Diagnosis not present

## 2020-03-15 LAB — CUP PACEART REMOTE DEVICE CHECK
Date Time Interrogation Session: 20220302090622
Implantable Lead Implant Date: 20141124
Implantable Lead Location: 753860
Implantable Lead Model: 365
Implantable Lead Serial Number: 10552343
Implantable Pulse Generator Implant Date: 20141124
Pulse Gen Model: 383594
Pulse Gen Serial Number: 60740583

## 2020-03-22 NOTE — Progress Notes (Signed)
Remote ICD transmission.   

## 2020-03-24 ENCOUNTER — Other Ambulatory Visit: Payer: Self-pay

## 2020-03-24 ENCOUNTER — Ambulatory Visit (INDEPENDENT_AMBULATORY_CARE_PROVIDER_SITE_OTHER): Payer: Federal, State, Local not specified - PPO | Admitting: Cardiovascular Disease

## 2020-03-24 ENCOUNTER — Encounter: Payer: Self-pay | Admitting: Cardiovascular Disease

## 2020-03-24 ENCOUNTER — Other Ambulatory Visit (HOSPITAL_COMMUNITY): Payer: Self-pay | Admitting: Cardiovascular Disease

## 2020-03-24 VITALS — BP 124/64 | HR 100 | Ht 68.0 in | Wt 147.0 lb

## 2020-03-24 DIAGNOSIS — I739 Peripheral vascular disease, unspecified: Secondary | ICD-10-CM

## 2020-03-24 DIAGNOSIS — I2510 Atherosclerotic heart disease of native coronary artery without angina pectoris: Secondary | ICD-10-CM

## 2020-03-24 DIAGNOSIS — I10 Essential (primary) hypertension: Secondary | ICD-10-CM | POA: Diagnosis not present

## 2020-03-24 MED ORDER — SODIUM CHLORIDE 0.9% FLUSH: 3.0000 mL | Freq: Two times a day (BID) | INTRAVENOUS | Status: DC

## 2020-03-24 NOTE — Progress Notes (Signed)
03/24/2020 Hector Byrd   09/11/1955  [id number]  Primary Physician Hector Rm, NP-C Primary Cardiologist: Lorretta Harp MD Hector Byrd, [name]:  Hector Byrd is a 65 y.o. seen appearing single Caucasian male with no children referred by Hector Hook NP for peripheral vascular valuation because of claudication.  His cardiologist is Dr. Golden Byrd.  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  He has treated hypertension and hyperlipidemia.  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  He has moderately severe LV dysfunction status post ICD implantation followed by Dr. Lovena Byrd.  He complains of bilateral lower extremity claudication right worse than left which is lifestyle limiting.  Doppler studies performed 12/21/2019 revealed a right ABI of 0.61 and a left of 0.79.  He had a high-frequency signal in his right external iliac artery and left common iliac artery.   Current Meds  Medication Sig  . aspirin EC 81 MG tablet Take 81 mg by mouth daily.   Marland Kitchen atorvastatin (LIPITOR) 80 MG tablet Take 1 tablet (80 mg total) by mouth daily.  . buprenorphine-naloxone (SUBOXONE) 8-2 MG SUBL SL tablet Place 0.5-1 tablets under the tongue See admin instructions. Up to 4 times daily 1 tablet in the morning, 1/2 tablet at 10am and 5pm, 1 tablet at 8pm  . clonazePAM (KLONOPIN) 1 MG tablet Take 1-3 mg by mouth 2 (two) times daily. 1 mg by mouth in the morning & 3 mg at bedtime.  . lamoTRIgine (LAMICTAL) 100 MG tablet Take 100 mg by mouth 2 (two) times daily.  Marland Kitchen lisinopril (ZESTRIL) 5 MG tablet Take 1 tablet (5 mg total) by mouth daily.  . naphazoline-pheniramine (NAPHCON-A) 0.025-0.3 % ophthalmic solution Place 1 drop into both eyes 2 (two) times daily.  . nitroGLYCERIN (NITROSTAT) 0.4 MG SL tablet Place 1 tablet (0.4 mg  total) under the tongue every 5 (five) minutes as needed for chest pain (MAX 3 TABLETS).  Marland Kitchen venlafaxine XR (EFFEXOR-XR) 150 MG 24 hr capsule Take 150 mg by mouth 2 (two) times daily.   . Vitamin D, Ergocalciferol, (DRISDOL) 1.25 MG (50000 UNIT) CAPS capsule Take 1 capsule (50,000 Units total) by mouth every 7 (seven) days.     No Known Allergies  Social History   Socioeconomic History  . Marital status: Single    Spouse name: Not on file  . Number of children: Not on file  . Years of education: Not on file  . Highest education level: Not on file  Occupational History  . Not on file  Tobacco Use  . Smoking status: Current Every Day Smoker    Packs/day: 0.50    Years: 40.00    Pack years: 20.00    Types: Cigarettes  . Smokeless tobacco: Never Used  . Tobacco comment: uses cigarelle- 4 per day  Vaping Use  . Vaping Use: Never used  Substance and Sexual Activity  . Alcohol use: No    Comment: HISTORY OF DRUG & ETOH 09/19/2004  . Drug use: No    Comment: none since 09/19/2004  . Sexual activity: Never  Other Topics Concern  . Not on file  Social History Narrative  . Not on file   Social Determinants of Health   Financial Resource Strain: Not on file  Food Insecurity: Not on  file  Transportation Needs: Not on file  Physical Activity: Not on file  Stress: Not on file  Social Connections: Not on file  Intimate Partner Violence: Not on file     Review of Systems: General: negative for chills, fever, night sweats or weight changes.  Cardiovascular: negative for chest pain, dyspnea on exertion, edema, orthopnea, palpitations, paroxysmal nocturnal dyspnea or shortness of breath Dermatological: negative for rash Respiratory: negative for cough or wheezing Urologic: negative for hematuria Abdominal: negative for nausea, vomiting, diarrhea, bright red blood per rectum, melena, or hematemesis Neurologic: negative for visual changes, syncope, or dizziness All other systems  reviewed and are otherwise negative except as noted above.    Blood pressure 124/64, pulse 100, height 5\' 8"  (1.727 m), weight 147 lb (66.7 kg).  General appearance: alert and no distress Neck: no adenopathy, no JVD, supple, symmetrical, trachea midline, thyroid not enlarged, symmetric, no tenderness/mass/nodules and Soft bilateral carotid bruits Lungs: clear to auscultation bilaterally Heart: regular rate and rhythm, S1, S2 normal, no murmur, click, rub or gallop Extremities: extremities normal, atraumatic, no cyanosis or edema Pulses: 2+ and symmetric Skin: Skin color, texture, turgor normal. No rashes or lesions Neurologic: Alert and oriented X 3, normal strength and tone. Normal symmetric reflexes. Normal coordination and gait  EKG sinus tachycardia 100 with septal Q waves as well as inferior Q waves.  I personally reviewed this EKG.  ASSESSMENT AND PLAN:   Peripheral arterial disease ([place]) Mr.Hector Byrd was referred to me by Hector Hook, NP for evaluation of symptomatic PAD.  He is a cardiology patient of Dr. Theodosia Byrd.  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  He has had right greater than left lower extremity claudication for the last year which is lifestyle limiting.  Doppler studies performed 12/21/2019 revealed a right ABI of 0.61 and a left of 0.79.  He has a high-frequency signal in his right external iliac artery and left common iliac artery.  He wishes to proceed with endovascular therapy for lifestyle limiting claudication.      Lorretta Harp MD FACP,FACC,FAHA, [facility] 03/24/2020 2:12 PM

## 2020-03-24 NOTE — Patient Instructions (Signed)
    [address] [place] [place] [address] Dept: [phone] Loc: [place]  03/24/2020  You are scheduled for a Peripheral Angiogram on Thursday, March 17 with Dr. Quay Burow.  1. Please arrive at the [facility] (Main Entrance A) at [facility]: [address] [place], [place] [address] at 7:30 AM (This time is two hours before your procedure to ensure your preparation). Free valet parking service is available.   Special note: Every effort is made to have your procedure done on time. Please understand that emergencies sometimes delay scheduled procedures.  2. Diet: Do not eat solid foods after midnight.  The patient may have clear liquids until 5am upon the day of the procedure.  3. Labs: You will need to have blood drawn today.  4. Medication instructions in preparation for your procedure:   On the morning of your procedure, take your Aspirin and any morning medicines NOT listed above.  You may use sips of water.  5. Plan for one night stay--bring personal belongings. 6. Bring a current list of your medications and current insurance cards. 7. You MUST have a responsible person to drive you home. 8. Someone MUST be with you the first 24 hours after you arrive home or your discharge will be delayed. 9. Please wear clothes that are easy to get on and off and wear slip-on shoes.  Thank you for allowing [place] to care for you!   -- [facility]  You will need a COVID-19  test prior to your procedure. You are scheduled for Tuesday 3/15 at 2:15PM. This is a Drive Up Visit at [address] [place],  [address]. Someone will direct you to the appropriate testing line. Stay in your car and someone will be with you shortly.  You will need to have lower extremity dopplers 1 week after this procedure.  You will need a 2 week follow  up appointment to see Dr. Gwenlyn Found after this procedure.

## 2020-03-24 NOTE — Assessment & Plan Note (Signed)
Hector Byrd was referred to me by Mack Hook, NP for evaluation of symptomatic PAD.  He is a cardiology patient of Dr. Theodosia Blender.  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  He has had right greater than left lower extremity claudication for the last year which is lifestyle limiting.  Doppler studies performed 12/21/2019 revealed a right ABI of 0.61 and a left of 0.79.  He has a high-frequency signal in his right external iliac artery and left common iliac artery.  He wishes to proceed with endovascular therapy for lifestyle limiting claudication.

## 2020-03-25 LAB — CBC
Hematocrit: 41.4 % (ref 37.5–51.0)
Hemoglobin: 13.9 g/dL (ref 13.0–17.7)
MCH: 31 pg (ref 26.6–33.0)
MCHC: 33.6 g/dL (ref 31.5–35.7)
MCV: 92 fL (ref 79–97)
Platelets: 394 10*3/uL (ref 150–450)
RBC: 4.49 x10E6/uL (ref 4.14–5.80)
RDW: 12.4 % (ref 11.6–15.4)
WBC: 13.4 10*3/uL — ABNORMAL HIGH (ref 3.4–10.8)

## 2020-03-25 LAB — BASIC METABOLIC PANEL
BUN/Creatinine Ratio: 21 (ref 10–24)
BUN: 21 mg/dL (ref 8–27)
CO2: 22 mmol/L (ref 20–29)
Calcium: 9.8 mg/dL (ref 8.6–10.2)
Chloride: 104 mmol/L (ref 96–106)
Creatinine, Ser: 0.99 mg/dL (ref 0.76–1.27)
Glucose: 70 mg/dL (ref 65–99)
Potassium: 5 mmol/L (ref 3.5–5.2)
Sodium: 142 mmol/L (ref 134–144)
eGFR: 83 mL/min/{1.73_m2} (ref >59)

## 2020-03-28 ENCOUNTER — Other Ambulatory Visit (HOSPITAL_COMMUNITY): Payer: Federal, State, Local not specified - PPO

## 2020-03-28 NOTE — Telephone Encounter (Signed)
Left message for pt to let him know that pv angiogram is cancelled as well as all follow up appointments made for post pv procedure have been cancelled. Instructed pt to call us when he is ready to re-make his pv angiogram.

## 2020-03-30 ENCOUNTER — Ambulatory Visit (HOSPITAL_COMMUNITY)
Admission: RE | Admit: 2020-03-30 | Payer: Federal, State, Local not specified - PPO | Source: Home / Self Care | Admitting: Cardiovascular Disease

## 2020-03-30 ENCOUNTER — Encounter (HOSPITAL_COMMUNITY): Admission: RE | Payer: Self-pay | Source: Home / Self Care

## 2020-03-30 SURGERY — ABDOMINAL AORTOGRAM W/LOWER EXTREMITY
Anesthesia: LOCAL

## 2020-04-07 ENCOUNTER — Encounter (HOSPITAL_COMMUNITY): Payer: Federal, State, Local not specified - PPO

## 2020-04-07 ENCOUNTER — Inpatient Hospital Stay (HOSPITAL_COMMUNITY): Admission: RE | Admit: 2020-04-07 | Payer: Federal, State, Local not specified - PPO | Source: Ambulatory Visit

## 2020-04-12 ENCOUNTER — Ambulatory Visit: Payer: Federal, State, Local not specified - PPO | Admitting: Cardiovascular Disease

## 2020-04-19 ENCOUNTER — Encounter: Payer: Self-pay | Admitting: Family Medicine

## 2020-04-27 ENCOUNTER — Encounter: Payer: Self-pay | Admitting: Family Medicine

## 2020-04-27 ENCOUNTER — Telehealth: Payer: Federal, State, Local not specified - PPO | Admitting: Family Medicine

## 2020-04-27 ENCOUNTER — Other Ambulatory Visit: Payer: Self-pay

## 2020-04-27 VITALS — BP 120/75 | Wt 147.0 lb

## 2020-04-27 DIAGNOSIS — E559 Vitamin D deficiency, unspecified: Secondary | ICD-10-CM

## 2020-04-27 NOTE — Progress Notes (Signed)
   Subjective:  Documentation for virtual telephone encounter.  The patient was located at home. The provider was located in the office. The patient did consent to this visit and is aware of possible charges through their insurance for this visit.  The other persons participating in this telemedicine service were none. Time spent on call was 3 minutes and in review of previous records 4 minutes total.  This virtual service is not related to other E/M service within previous 7 days.  99441 (5-43min) 99442 (11-54min) 99443 (21-73min)    Patient ID: Hector Byrd, male    DOB: [birth year], 65 y.o.   MRN: [id number]  HPI Chief Complaint  Patient presents with  . discuss vitamins    Vitamin C, magnesium and zinc. Wants to know if this is ok to add to regimen   This is a visit to discuss whether he can start taking a vitamin that contains vitamin C, magnesium and zinc.  States he has been taking vitamin D for his vitamin D deficiency and is feeling more energetic since starting it.  No other concerns today   Review of Systems Pertinent positives and negatives in the history of present illness.     Objective:   Physical Exam BP 120/75   Wt 147 lb (66.7 kg)   BMI 22.35 kg/m   Alert and oriented and in no acute distress      Assessment & Plan:  Vitamin D deficiency  Discussed that I am okay with him taking vitamin C, low-dose magnesium and zinc and a multivitamin.  I thanked him for consulting with me before taking any new over-the-counter medications.  Continue on vitamin D as well

## 2020-06-13 ENCOUNTER — Ambulatory Visit (INDEPENDENT_AMBULATORY_CARE_PROVIDER_SITE_OTHER): Payer: Federal, State, Local not specified - PPO

## 2020-06-13 DIAGNOSIS — I255 Ischemic cardiomyopathy: Secondary | ICD-10-CM

## 2020-06-14 LAB — CUP PACEART REMOTE DEVICE CHECK
Date Time Interrogation Session: 20220531075410
Implantable Lead Implant Date: 20141124
Implantable Lead Location: 753860
Implantable Lead Model: 365
Implantable Lead Serial Number: 10552343
Implantable Pulse Generator Implant Date: 20141124
Pulse Gen Model: 383594
Pulse Gen Serial Number: 60740583

## 2020-07-05 NOTE — Progress Notes (Signed)
Remote ICD transmission.   

## 2020-08-14 ENCOUNTER — Emergency Department (HOSPITAL_COMMUNITY): Payer: Federal, State, Local not specified - PPO

## 2020-08-14 ENCOUNTER — Encounter (HOSPITAL_COMMUNITY): Payer: Self-pay | Admitting: Emergency Medicine

## 2020-08-14 ENCOUNTER — Emergency Department (HOSPITAL_COMMUNITY)
Admission: EM | Admit: 2020-08-14 | Discharge: 2020-08-14 | Disposition: A | Discharge status: Home / Self Care | Payer: Federal, State, Local not specified - PPO | Attending: Emergency Medicine | Admitting: Emergency Medicine

## 2020-08-14 ENCOUNTER — Other Ambulatory Visit: Payer: Self-pay

## 2020-08-14 DIAGNOSIS — F17290 Nicotine dependence, other tobacco product, uncomplicated: Secondary | ICD-10-CM | POA: Insufficient documentation

## 2020-08-14 DIAGNOSIS — Z95810 Presence of automatic (implantable) cardiac defibrillator: Secondary | ICD-10-CM | POA: Insufficient documentation

## 2020-08-14 DIAGNOSIS — Z7982 Long term (current) use of aspirin: Secondary | ICD-10-CM | POA: Insufficient documentation

## 2020-08-14 DIAGNOSIS — R519 Headache, unspecified: Secondary | ICD-10-CM | POA: Diagnosis present

## 2020-08-14 DIAGNOSIS — I110 Hypertensive heart disease with heart failure: Secondary | ICD-10-CM | POA: Insufficient documentation

## 2020-08-14 DIAGNOSIS — Z20822 Contact with and (suspected) exposure to covid-19: Secondary | ICD-10-CM | POA: Diagnosis not present

## 2020-08-14 DIAGNOSIS — Z951 Presence of aortocoronary bypass graft: Secondary | ICD-10-CM | POA: Diagnosis not present

## 2020-08-14 DIAGNOSIS — R0789 Other chest pain: Secondary | ICD-10-CM | POA: Diagnosis not present

## 2020-08-14 DIAGNOSIS — Z955 Presence of coronary angioplasty implant and graft: Secondary | ICD-10-CM | POA: Diagnosis not present

## 2020-08-14 DIAGNOSIS — Z85828 Personal history of other malignant neoplasm of skin: Secondary | ICD-10-CM | POA: Insufficient documentation

## 2020-08-14 DIAGNOSIS — Z79899 Other long term (current) drug therapy: Secondary | ICD-10-CM | POA: Diagnosis not present

## 2020-08-14 DIAGNOSIS — I2510 Atherosclerotic heart disease of native coronary artery without angina pectoris: Secondary | ICD-10-CM | POA: Diagnosis not present

## 2020-08-14 DIAGNOSIS — R5383 Other fatigue: Secondary | ICD-10-CM

## 2020-08-14 DIAGNOSIS — I5042 Chronic combined systolic (congestive) and diastolic (congestive) heart failure: Secondary | ICD-10-CM | POA: Diagnosis not present

## 2020-08-14 LAB — RESP PANEL BY RT-PCR (FLU A&B, COVID) ARPGX2
Influenza A by PCR: NEGATIVE
Influenza B by PCR: NEGATIVE
SARS Coronavirus 2 by RT PCR: NEGATIVE

## 2020-08-14 LAB — CBC
HCT: 37.3 % — ABNORMAL LOW (ref 39.0–52.0)
Hemoglobin: 12.5 g/dL — ABNORMAL LOW (ref 13.0–17.0)
MCH: 31.9 pg (ref 26.0–34.0)
MCHC: 33.5 g/dL (ref 30.0–36.0)
MCV: 95.2 fL (ref 80.0–100.0)
Platelets: 282 10*3/uL (ref 150–400)
RBC: 3.92 MIL/uL — ABNORMAL LOW (ref 4.22–5.81)
RDW: 13.2 % (ref 11.5–15.5)
WBC: 9.3 10*3/uL (ref 4.0–10.5)
nRBC: 0 % (ref 0.0–0.2)

## 2020-08-14 LAB — BASIC METABOLIC PANEL
Anion gap: 8 (ref 5–15)
BUN: 17 mg/dL (ref 8–23)
CO2: 25 mmol/L (ref 22–32)
Calcium: 9.4 mg/dL (ref 8.9–10.3)
Chloride: 105 mmol/L (ref 98–111)
Creatinine, Ser: 1.1 mg/dL (ref 0.61–1.24)
GFR, Estimated: >60 mL/min (ref >60)
Glucose, Bld: 106 mg/dL — ABNORMAL HIGH (ref 70–99)
Potassium: 4.1 mmol/L (ref 3.5–5.1)
Sodium: 138 mmol/L (ref 135–145)

## 2020-08-14 LAB — BRAIN NATRIURETIC PEPTIDE: B Natriuretic Peptide: 88.3 pg/mL (ref 0.0–100.0)

## 2020-08-14 LAB — TROPONIN I (HIGH SENSITIVITY)
Troponin I (High Sensitivity): 7 ng/L (ref <18)
Troponin I (High Sensitivity): 7 ng/L (ref <18)

## 2020-08-14 NOTE — Discharge Instructions (Addendum)
Your COVID and influenza test were negative.  Your defibrillator interrogation was reassuringly normal.  Please follow-up with your cardiologist and primary care provider.  Return to the ER for any new or concerning symptoms.
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
## 2020-08-14 NOTE — ED Provider Notes (Signed)
[facility] EMERGENCY DEPARTMENT Provider Note   CSN: [id number] Arrival date & time: 08/14/20  1121     History Chief Complaint  Patient presents with   Fatigue   Headache    Hector Byrd is a 65 y.o. male.  HPI Patient is a 65 year old male with past medical history significant for 3 cm AAA, AICD due to ischemic cardiomyopathy, anxiety, arthritis, CAD, combined systolic and diastolic heart failure, depression, dyslipidemia, reflux, HTN, emphysema, former smoker.  Patient is a 65 year old male presenting today with complaints of fatigue over the past week or so.  He states he has felt more tired and less energetic.  He states that what brought him to the ER today was that he got into an argument with his landlord today and after the argument states that he started having chest pressure.  He states that this occurred approximately 3 to 4 hours ago.  He states that the pressure he feels is actually right around his defibrillator site.  He states that he is not certain that he would call a pain or pressure and necessarily been more of a strange sensation.  He states that he did not feel it fire.  He states however that sensation feels similar to how it has before/after it expired.  He states that he took 1 dose of aspirin and 1 nitroglycerin and states that his pain at this time is completely resolved although he states he has difficulty describing this as pain even before the nitroglycerin.  Additionally patient informs me that he had a mechanical fall 7/22 he states that during the fall he slipped hitting his head on the back of the head.  He states that he did pass out briefly but states that it was no more than 1 or 2 minutes.  Denies any confusion afterwards.  States that he got up and went about his business.  Denies any neck pain.  No numbness or weakness.  Denies any pain or symptoms currently.  States he is completely symptom-free.    Past Medical History:   Diagnosis Date   AAA (abdominal aortic aneurysm) ([place])    3.0cm by duplex 12/2019   AICD (automatic cardioverter/defibrillator) present    Anxiety    Aortic insufficiency    mild to moderate by echo 11/2019   Arthritis    CAD (coronary artery disease)    a. s/p stent x2 and then CABG 1996.   Cancer of kidney ([place])    Kidney    Cardiomyopathy, ischemic    Chronic combined systolic (congestive) and diastolic (congestive) heart failure ([place]) 07/07/2017   Depression    Dilated aortic root (HCC)    Dyslipidemia    Dyspnea    with exertion   Erectile dysfunction    Fatty liver 11/04/2019   GERD (gastroesophageal reflux disease)    Headache    Hypertension    Kidney cancer, primary, with metastasis from kidney to other site [facility])    Orthostasis    Pneumonia    Restless legs    not bothered by this anymore   Rotator cuff tear    S/P CABG (coronary artery bypass graft)    Ventricular tachycardia [facility])     Patient Active Problem List   Diagnosis Date Noted   Peripheral arterial disease ([place]) 03/24/2020   Aortic insufficiency    Fatty liver 11/04/2019   Elevated alkaline phosphatase level 09/07/2019   Vitamin D deficiency 09/07/2019   Smoker 09/06/2019   DOE (dyspnea on  exertion) 09/06/2019   History of kidney cancer 09/06/2019   Abnormal lung sounds 09/06/2019   Abnormal ECG 09/06/2019   Chronic combined systolic (congestive) and diastolic (congestive) heart failure ([place]) 07/07/2017   ICD (implantable cardioverter-defibrillator) malfunction 10/01/2016   Ventricular tachycardia ([place])    Emphysema lung ([place]) 08/16/2016   Thoracic aortic aneurysm ([place]) 08/16/2016   ICD (implantable cardioverter-defibrillator) in place 04/20/2014   SOB (shortness of breath) 10/19/2013   HTN (hypertension) 02/03/2013   Cardiomyopathy, ischemic    Erectile dysfunction    CAD (coronary artery disease)    Rotator cuff tear    Depression    Dyslipidemia    Cancer of kidney [facility])     Past  Surgical History:  Procedure Laterality Date   CARDIAC CATHETERIZATION     Cardiac stents     prior to 2005- 3 times total   COLONOSCOPY     CORONARY ARTERY BYPASS GRAFT  2005   CORONARY STENT PLACEMENT     ICD LEAD REMOVAL N/A 10/02/2016   Procedure: ICD LEAD REMOVAL WITH IMPLANTATION OF BIOTRONIK PLEXA PROMRI DF-1 [place] [id number];  Surgeon: Evans Lance, MD;  Location: [place];  Service: Cardiovascular;  Laterality: N/A;  [place] to back up   [place]  12-07-2012   BTK single chamber ICD implanted by Dr Lovena Le   IMPLANTABLE CARDIOVERTER DEFIBRILLATOR IMPLANT N/A 12/07/2012   Procedure: IMPLANTABLE CARDIOVERTER DEFIBRILLATOR IMPLANT;  Surgeon: Evans Lance, MD;  Location: [facility] CATH LAB;  Service: Cardiovascular;  Laterality: N/A;   Kidney resection     small amount of kidney   ROTATOR CUFF REPAIR     TEE WITHOUT CARDIOVERSION N/A 10/02/2016   Procedure: TRANSESOPHAGEAL ECHOCARDIOGRAM (TEE);  Surgeon: Evans Lance, MD;  Location: [facility] OR;  Service: Cardiovascular;  Laterality: N/A;       Family History  Problem Relation Age of Onset   Diabetes Mother    Cancer Father    Suicidality Brother     Social History   Tobacco Use   Smoking status: Every Day    Types: Cigars   Smokeless tobacco: Never   Tobacco comments:    uses cigarelle- 4 per day  Vaping Use   Vaping Use: Never used  Substance Use Topics   Alcohol use: No    Comment: HISTORY OF DRUG & ETOH 09/19/2004   Drug use: No    Comment: none since 09/19/2004    Home Medications Prior to Admission medications   Medication Sig Start Date End Date Taking? Authorizing Provider  aspirin EC 81 MG tablet Take 81 mg by mouth daily.     [provider]  atorvastatin (LIPITOR) 80 MG tablet Take 1 tablet (80 mg total) by mouth daily. Patient taking differently: Take 80 mg by mouth at bedtime. 11/24/19   Sueanne Margarita, MD  Buprenorphine HCl-Naloxone HCl 8-2 MG FILM Place  0.5-1 Film under the tongue See admin instructions. Take 1 film sublingual in the morning, take 0.5 film sublingual at 1300 & take 1 film sublingual at supper 02/24/20   [provider]  Cholecalciferol (VITAMIN D3) 50 MCG (2000 UT) TABS Take 2,000 Units by mouth daily.    [provider]  clonazePAM (KLONOPIN) 1 MG tablet Take 1-3 mg by mouth 2 (two) times daily. Take 1 tablet (1 mg) by mouth in the morning & take 2.5 tablets (2.5 mg) by mouth at bedtime. 09/23/12   [provider]  lamoTRIgine (LAMICTAL) 100 MG tablet  Take 100 mg by mouth 2 (two) times daily. 10/21/19   [provider]  lisinopril (ZESTRIL) 5 MG tablet Take 1 tablet (5 mg total) by mouth daily. Patient taking differently: Take 5 mg by mouth in the morning. 11/24/19   Shirley Friar, PA-C  metoprolol succinate (TOPROL-XL) 50 MG 24 hr tablet Take 1 tablet (50 mg total) by mouth daily. Take with or immediately following a meal. Patient taking differently: Take 50 mg by mouth daily with supper. Take with or immediately following a meal. 11/09/19 02/07/20  Dunn, Dayna N, PA-C  naphazoline-pheniramine (NAPHCON-A) 0.025-0.3 % ophthalmic solution Place 2 drops into both eyes in the morning.    [provider]  naproxen sodium (ALEVE) 220 MG tablet Take 220-440 mg by mouth daily as needed (pain).    [provider]  nitroGLYCERIN (NITROSTAT) 0.4 MG SL tablet Place 1 tablet (0.4 mg total) under the tongue every 5 (five) minutes as needed for chest pain (MAX 3 TABLETS). Patient taking differently: Place 0.4 mg under the tongue every 5 (five) minutes x 3 doses as needed for chest pain (MAX 3 TABLETS). 11/13/18   Evans Lance, MD  venlafaxine XR (EFFEXOR-XR) 150 MG 24 hr capsule Take 150 mg by mouth 2 (two) times daily.  09/26/12   [provider]    Allergies    Patient has no known allergies.  Review of Systems   Review of Systems  Constitutional:  Negative for chills  and fever.  HENT:  Negative for congestion.   Eyes:  Negative for pain.  Respiratory:  Positive for cough. Negative for shortness of breath.   Cardiovascular:  Negative for chest pain and leg swelling.  Gastrointestinal:  Negative for abdominal pain, diarrhea, nausea and vomiting.  Genitourinary:  Negative for dysuria.  Musculoskeletal:  Positive for myalgias.       BL leg pain  Skin:  Negative for rash.  Neurological:  Negative for dizziness and headaches.   Physical Exam Updated Vital Signs BP 118/70 (BP Location: Right Arm)   Pulse 72   Temp 97.9 F (36.6 C) (Oral)   Resp 16   Ht '5\' 6"'$  (1.676 m)   Wt 61.2 kg   SpO2 98%   BMI 21.79 kg/m   Physical Exam Vitals and nursing note reviewed.  Constitutional:      General: He is not in acute distress.    Comments: 65 year old male, in no acute distress.  He complains of no pain.  HENT:     Head: Normocephalic and atraumatic.     Nose: Nose normal.     Mouth/Throat:     Mouth: Mucous membranes are moist.  Eyes:     General: No scleral icterus.    Extraocular Movements: Extraocular movements intact.  Cardiovascular:     Rate and Rhythm: Normal rate and regular rhythm.     Pulses: Normal pulses.     Heart sounds: Normal heart sounds.  Pulmonary:     Effort: Pulmonary effort is normal. No respiratory distress.     Breath sounds: Normal breath sounds. No wheezing.  Abdominal:     Palpations: Abdomen is soft.     Tenderness: There is no abdominal tenderness. There is no guarding or rebound.     Comments: Abd is soft, non-TTP.   Musculoskeletal:     Cervical back: Normal range of motion.     Right lower leg: No edema.     Left lower leg: No edema.  Skin:  General: Skin is warm and dry.     Capillary Refill: Capillary refill takes less than 2 seconds.     Comments: AICD palpable in pocket left upper chest - no fluctuance/TTP or redness  Neurological:     Mental Status: He is alert. Mental status is at baseline.      Comments: Alert and oriented to self, place, time and event.   Speech is fluent, clear without dysarthria or dysphasia.   Strength 5/5 in upper/lower extremities   Sensation intact in upper/lower extremities   CN I not tested  CN II grossly intact visual fields bilaterally. Did not visualize posterior eye.  CN III, IV, VI PERRLA and EOMs intact bilaterally  CN V Intact sensation to sharp and light touch to the face  CN VII facial movements symmetric  CN VIII not tested  CN IX, X no uvula deviation, symmetric rise of soft palate  CN XI 5/5 SCM and trapezius strength bilaterally  CN XII Midline tongue protrusion, symmetric L/R movements   Psychiatric:        Mood and Affect: Mood normal.        Behavior: Behavior normal.    ED Results / Procedures / Treatments   Labs (all labs ordered are listed, but only abnormal results are displayed) Labs Reviewed  BASIC METABOLIC PANEL - Abnormal; Notable for the following components:      Result Value   Glucose, Bld 106 (*)    All other components within normal limits  CBC - Abnormal; Notable for the following components:   RBC 3.92 (*)    Hemoglobin 12.5 (*)    HCT 37.3 (*)    All other components within normal limits  RESP PANEL BY RT-PCR (FLU A&B, COVID) [id number]  BRAIN NATRIURETIC PEPTIDE  TROPONIN I (HIGH SENSITIVITY)  TROPONIN I (HIGH SENSITIVITY)    EKG None  Radiology DG Chest 2 View  Result Date: 08/14/2020 CLINICAL DATA:  Chest pain. Additional history provided: Acute onset lethargy and headache, recent fall. EXAM: CHEST - 2 VIEW COMPARISON:  Prior chest radiographs 09/16/2019 and earlier. CT angiogram chest 10/02/2017. FINDINGS: Redemonstrated single lead implantable cardiac device. Prior median sternotomy. Heart size within normal limits. No appreciable airspace consolidation. No evidence of pleural effusion or pneumothorax. No acute bony abnormality identified. Chronic fracture deformity of the proximal left humerus.  Advanced degenerative changes of the right glenohumeral joint. Redemonstrated chronic superior endplate compression deformities within the lower thoracic spine. IMPRESSION: No evidence of acute cardiopulmonary abnormality. Electronically Signed   By: Kellie Simmering DO   On: 08/14/2020 12:23   CT Head Wo Contrast  Result Date: 08/14/2020 CLINICAL DATA:  Head trauma, fall 7/22 EXAM: CT HEAD WITHOUT CONTRAST TECHNIQUE: Contiguous axial images were obtained from the base of the skull through the vertex without intravenous contrast. COMPARISON:  04/23/2016 FINDINGS: Brain: No evidence of acute infarction, hemorrhage, hydrocephalus, extra-axial collection or mass lesion/mass effect. Mild periventricular white matter hypodensity. Vascular: No hyperdense vessel or unexpected calcification. Skull: Normal. Negative for fracture or focal lesion. Sinuses/Orbits: No acute finding. Other: Incidental benign exostosis of the right mandibular condyle (series 4, image 16). IMPRESSION: No acute intracranial pathology. Small-vessel white matter disease. Electronically Signed   By: Eddie Candle M.D.   On: 08/14/2020 16:22    Procedures Procedures   Medications Ordered in ED Medications - No data to display  ED Course  I have reviewed the triage vital signs and the nursing notes.  Pertinent labs & imaging results that were  available during my care of the patient were reviewed by me and considered in my medical decision making (see chart for details).  Clinical Course as of 08/15/20 1444  Mon Aug 14, 2020  1439 Discussed with radiology technician who states that patient is currently on line for CT scan of head.  I ordered this CT scan at 1:07 PM.  Per rad tech patient is currently 6 in line. [WF]  1441 Biotronik technician has evaluated patient and did interrogation of pacemaker states that there has not been any events recently specifically no events within the past 48 hours and more specifically none within the period  of time the patient states he had symptoms. [WF]    Clinical Course User Index [WF] Tedd Sias, PA   MDM Rules/Calculators/A&P                           Patient is a somewhat chronically ill but well-appearing 65 year old male presented today with multiple medical issues discussed in HPI.  Patient notably has a AICD due to ischemic heart disease.  He is here after he had a disagreement with his landlord and had a sensation in his chest that felt abnormal.  It is not exertional nor pleuritic he has some difficulty knowing whether there are any aggravating or mitigating factors given that he is not having this pain at all anymore.  He did take a nitroglycerin and aspirin this morning.  EKG with some evidence of old infarctions however no changes from old tracings.  Notably there is no ST-T wave abnormalities consistent with STEMI.  Chest x-ray unremarkable, CT head unremarkable.  CBC and BMP without acute abnormalities.  Troponin x2 without any significant delta and are not elevated.  BNP unremarkable.  COVID influenza negative.  Biotronik technician arrived and evaluated AICD was without any findings.  Patient will follow closely with cardiologist Dr. Lovena Le and his primary care provider.  Return precautions were given and were understood and taught back. Patient is agreeable to discharge home at this time.  He will return to the ER for any new or concerning symptoms.  He is strict return precautions were understood by the patient.  He is ambulatory time of discharge.  Agreeable to discharge.  Final Clinical Impression(s) / ED Diagnoses Final diagnoses:  Other fatigue  Chest pain, atypical  Acute nonintractable headache, unspecified headache type    Rx / DC Orders ED Discharge Orders     None        Tedd Sias, [place] 08/15/20 [place], MD 08/17/20 [phone]

## 2020-08-14 NOTE — ED Notes (Signed)
Patient transported to CT 

## 2020-08-14 NOTE — ED Notes (Signed)
Pt NAD, a/ox4. Pt verbalizes understanding of all DC and f/u instructions. All questions answered. Pt walks with steady gait to DC

## 2020-08-14 NOTE — ED Notes (Signed)
Holly from [place] will be coming to interrogate pt's defibrillator. She states it will be about an hour before she arrives.

## 2020-08-15 ENCOUNTER — Other Ambulatory Visit: Payer: Self-pay | Admitting: Medical

## 2020-08-15 ENCOUNTER — Telehealth: Payer: Self-pay | Admitting: Family Medicine

## 2020-08-15 DIAGNOSIS — Z599 Problem related to housing and economic circumstances, unspecified: Secondary | ICD-10-CM

## 2020-08-15 DIAGNOSIS — Z59819 Housing instability, housed unspecified: Secondary | ICD-10-CM

## 2020-08-15 NOTE — Telephone Encounter (Signed)
This pt was seen in the ER yesterday and was on Pt ping discharge list. Pt was called concerning ER visit. Pt seem very concerned about his landlord charging him incorrectly and states that he has no money to be see and no way to get here.Appt was declined.  He states that currently he has no power and is attempting to get that turned back on. He states that last month he did not have enough food and was concerned he would not have enough this month. While discussing his case with Beverlee Nims she states that he maybe a patient to refer to [id number] for a Education officer, museum. This is a [place] pt and sending this to [place].

## 2020-08-15 NOTE — Telephone Encounter (Signed)
Left message for pt to call me back 

## 2020-08-15 NOTE — Progress Notes (Signed)
refe

## 2020-08-15 NOTE — Telephone Encounter (Signed)
Put the order in is my understanding.

## 2020-08-16 ENCOUNTER — Telehealth: Payer: Self-pay

## 2020-08-16 NOTE — Telephone Encounter (Signed)
   Telephone encounter was:  Successful.  08/16/2020 Name: Suheyb Verhagen MRN: [id number] DOB: [birth year]  Hector Byrd is a 65 y.o. year old male who is a primary care patient of Raenette Rover, [place], NP-C . The community resource team was consulted for assistance with  housing.  Care guide performed the following interventions:  Spoke to patient about housing resources.  Confirmed email address [email or web address]. [employer] they will not begin emergency assistance program until mid August, [place] is no longer accepting applications for financial assistance.  [place] the waitlist for elderly housing is 12-24 months, the waitlist for regular housing is closed.  I included information for [employer], [employer] for Housing and [employer], Research scientist (physical sciences) for [employer], [employer], Passenger transport manager transportation, Physicist, medical. Letter saved in Epic.  Follow Up Plan:  Care guide will follow up with patient by phone over the next 7 days.  Athira Janowicz, AAS Paralegal, [facility] [employer]  [address], [place] [address] ??[email or web address]  ?? [id number]   [email or web address]

## 2020-08-16 NOTE — Telephone Encounter (Signed)
Pt states he is suppose to sign another lease on the 8th but would like [place] to put in a referral still

## 2020-08-22 ENCOUNTER — Telehealth: Payer: Self-pay

## 2020-08-22 NOTE — Telephone Encounter (Signed)
   Telephone encounter was:  Unsuccessful.  08/22/2020 Name: Hector Byrd MRN: [id number] DOB: 1955/12/05  Unsuccessful outbound call made today to assist with:  Left message on voicemail for patient to return my call regarding housing resources.   Outreach Attempt:  2nd Attempt  A HIPAA compliant voice message was left requesting a return call.  Instructed patient to call back at [phone].  Arica Bevilacqua, AAS Paralegal, [place] [employer]  [address], [place] [address] ??[email or web address]  ?? [id number]   [email or web address]

## 2020-08-30 ENCOUNTER — Telehealth: Payer: Self-pay

## 2020-08-30 NOTE — Telephone Encounter (Signed)
   Telephone encounter was:  Unsuccessful.  08/30/2020 Name: Hector Byrd MRN: [id number] DOB: 1955/03/22  Unsuccessful outbound call made today to assist with:  Left message  on voicemail for patient to return my call regarding email sent  with housing resources.   Outreach Attempt:  3rd Attempt.  Referral closed unable to contact patient.  A HIPAA compliant voice message was left requesting a return call.  Instructed patient to call back at [phone].  Jessica Seidman, AAS Paralegal, [place] [employer]  [address], [place] [address] ??[email or web address]  ?? [id number]   [email or web address]

## 2020-09-12 ENCOUNTER — Ambulatory Visit (INDEPENDENT_AMBULATORY_CARE_PROVIDER_SITE_OTHER): Payer: Federal, State, Local not specified - PPO

## 2020-09-12 DIAGNOSIS — I255 Ischemic cardiomyopathy: Secondary | ICD-10-CM | POA: Diagnosis not present

## 2020-09-12 LAB — CUP PACEART REMOTE DEVICE CHECK
Date Time Interrogation Session: 20220830175711
Implantable Lead Implant Date: 20141124
Implantable Lead Location: 753860
Implantable Lead Model: 365
Implantable Lead Serial Number: 10552343
Implantable Pulse Generator Implant Date: 20141124
Pulse Gen Model: 383594
Pulse Gen Serial Number: 60740583

## 2020-09-25 NOTE — Progress Notes (Signed)
Remote ICD transmission.   

## 2020-10-16 ENCOUNTER — Encounter: Payer: Self-pay | Admitting: Family Medicine

## 2020-11-26 ENCOUNTER — Other Ambulatory Visit: Payer: Self-pay | Admitting: Cardiology

## 2020-12-09 ENCOUNTER — Other Ambulatory Visit: Payer: Self-pay | Admitting: Cardiology

## 2020-12-12 ENCOUNTER — Ambulatory Visit (INDEPENDENT_AMBULATORY_CARE_PROVIDER_SITE_OTHER): Payer: Federal, State, Local not specified - PPO

## 2020-12-12 DIAGNOSIS — I255 Ischemic cardiomyopathy: Secondary | ICD-10-CM

## 2020-12-12 LAB — CUP PACEART REMOTE DEVICE CHECK
Date Time Interrogation Session: 20221129110256
Implantable Lead Implant Date: 20141124
Implantable Lead Location: 753860
Implantable Lead Model: 365
Implantable Lead Serial Number: 10552343
Implantable Pulse Generator Implant Date: 20141124
Pulse Gen Model: 383594
Pulse Gen Serial Number: 60740583

## 2020-12-21 ENCOUNTER — Other Ambulatory Visit: Payer: Self-pay | Admitting: Cardiology

## 2020-12-21 NOTE — Progress Notes (Signed)
Remote ICD transmission.   

## 2020-12-28 ENCOUNTER — Ambulatory Visit (HOSPITAL_COMMUNITY): Payer: Federal, State, Local not specified - PPO

## 2021-01-05 ENCOUNTER — Other Ambulatory Visit: Payer: Self-pay | Admitting: Cardiology

## 2021-01-18 ENCOUNTER — Other Ambulatory Visit: Payer: Self-pay | Admitting: Cardiology

## 2021-01-18 ENCOUNTER — Encounter: Payer: Self-pay | Admitting: Cardiology

## 2021-02-01 ENCOUNTER — Other Ambulatory Visit: Payer: Self-pay | Admitting: Cardiology

## 2021-02-08 ENCOUNTER — Other Ambulatory Visit: Payer: Self-pay | Admitting: Cardiology

## 2021-02-08 ENCOUNTER — Telehealth: Payer: Federal, State, Local not specified - PPO | Admitting: Cardiology

## 2021-02-08 ENCOUNTER — Other Ambulatory Visit: Payer: Self-pay

## 2021-02-08 NOTE — Progress Notes (Signed)
Virtual Visit via Video Note   This visit type was conducted due to national recommendations for restrictions regarding the COVID-19 Pandemic (e.g. social distancing) in an effort to limit this patient's exposure and mitigate transmission in our community.  Due to his co-morbid illnesses, this patient is at least at moderate risk for complications without adequate follow up.  This format is felt to be most appropriate for this patient at this time.  All issues noted in this document were discussed and addressed.  A limited physical exam was performed with this format.  Please refer to the patient's chart for his consent to telehealth for [facility].     Date:  02/09/2021   ID:  Hector Byrd, DOB [birth year], MRN [id number] The patient was identified using 2 identifiers.  Patient Location: Home Provider Location: Home Office   PCP:  [name], PA-C   [place] [facility] Cardiologist:  Fransico Him, MD Electrophysiologist:  Cristopher Peru, MD     Evaluation Performed:  Follow-Up Visit  Chief Complaint:  CAD, HLD, HTN, CHF  History of Present Illness:    Hector Byrd is a 66 y.o. 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  He is followed by Dr. Lovena Le for his ICD.  Echo in 2014 showed EF 30 to 35% and cath 2005 showed patent RIMA-PDA, occluded radial graft to the left system.  He has a hx of syncopal episodes along with dizziness felt related to orthostatic hypotension. He has a hx of PAD followed by Dr. Gwenlyn Found.   2D echo 2021 showed EF 35-40% with ak of mid inferolateral wall and HK of basal to mid inferoseptal and  inferior walls, mild to moderate AR and mildly dilated ascending aorta at 5mm.    He is here today for followup and is doing well.  He denies any chest pain or pressure,  SOB, DOE, PND, orthopnea, LE edema, dizziness, palpitations or syncope. He gets fatigue in his lower legs if he walks over a block.  He is compliant with his meds and is tolerating meds with no SE.      The patient does not have symptoms concerning for COVID-19 infection (fever, chills, cough, or new shortness of breath).    Past Medical History:  Diagnosis Date   AAA (abdominal aortic aneurysm)    3.0cm by duplex 12/2019   AICD (automatic cardioverter/defibrillator) present    Anxiety    Aortic insufficiency    mild to moderate by echo 11/2019   Arthritis    CAD (coronary artery disease)    a. s/p stent x2 and then CABG 1996.   Cancer of kidney ([place])    Kidney    Cardiomyopathy, ischemic    Chronic combined systolic (congestive) and diastolic (congestive) heart failure ([place]) 07/07/2017   Depression    Dilated aortic root (H[facility])    Dyslipidemia    Dyspnea    with exertion   Erectile dysfunction    Fatty liver 11/04/2019   GERD (gastroesophageal reflux disease)    Headache    Hypertension    Kidney cancer, primary, with metastasis from kidney to other site [facility])    Orthostasis    Pneumonia    Restless legs    not bothered by this anymore   Rotator cuff tear    S/P CABG (  coronary artery bypass graft)    Ventricular tachycardia    Past Surgical History:  Procedure Laterality Date   CARDIAC CATHETERIZATION     Cardiac stents     prior to 2005- 3 times total   COLONOSCOPY     CORONARY ARTERY BYPASS GRAFT  2005   CORONARY STENT PLACEMENT     ICD LEAD REMOVAL N/A 10/02/2016   Procedure: ICD LEAD REMOVAL WITH IMPLANTATION OF BIOTRONIK PLEXA PROMRI DF-1 S DX 65/17 SN [id number];  Surgeon: Evans Lance, MD;  Location: [place];  Service: Cardiovascular;  Laterality: N/A;  [place] to back up   [place]  12-07-2012   BTK single chamber ICD implanted by Dr Lovena Le   IMPLANTABLE CARDIOVERTER DEFIBRILLATOR IMPLANT N/A 12/07/2012   Procedure:  IMPLANTABLE CARDIOVERTER DEFIBRILLATOR IMPLANT;  Surgeon: Evans Lance, MD;  Location: [facility] CATH LAB;  Service: Cardiovascular;  Laterality: N/A;   Kidney resection     small amount of kidney   ROTATOR CUFF REPAIR     TEE WITHOUT CARDIOVERSION N/A 10/02/2016   Procedure: TRANSESOPHAGEAL ECHOCARDIOGRAM (TEE);  Surgeon: Evans Lance, MD;  Location: [facility] OR;  Service: Cardiovascular;  Laterality: N/A;     Current Meds  Medication Sig   aspirin EC 81 MG tablet Take 81 mg by mouth daily.    atorvastatin (LIPITOR) 80 MG tablet TAKE 1 TABLET (80 MG TOTAL) BY MOUTH DAILY. PLEASE MAKE OVERDUE APPT WITH DR. Radford Pax BEFORE REFILLS   Buprenorphine HCl-Naloxone HCl 8-2 MG FILM Place 0.5-1 Film under the tongue See admin instructions. Take 1 film sublingual in the morning, take 0.5 film sublingual at 1300 & take 1 film sublingual at supper   Cholecalciferol (VITAMIN D3) 50 MCG (2000 UT) TABS Take 2,000 Units by mouth daily.   clonazePAM (KLONOPIN) 1 MG tablet Take 1-3 mg by mouth 2 (two) times daily. Take 1 tablet (1 mg) by mouth in the morning & take 2.5 tablets (2.5 mg) by mouth at bedtime.   lamoTRIgine (LAMICTAL) 100 MG tablet Take 100 mg by mouth 2 (two) times daily.   lisinopril (ZESTRIL) 5 MG tablet TAKE 1 TABLET (5 MG TOTAL) BY MOUTH DAILY. PLEASE MAKE OVERDUE APPT WITH DR. Radford Pax BEFORE ANYMORE REFILLS. THANK YOU 2ND ATTEMPT   metoprolol succinate (TOPROL-XL) 50 MG 24 hr tablet Take 1 tablet (50 mg total) by mouth daily. Take with or immediately following a meal. (Patient taking differently: Take 50 mg by mouth daily with supper. Take with or immediately following a meal.)   naphazoline-pheniramine (NAPHCON-A) 0.025-0.3 % ophthalmic solution Place 2 drops into both eyes in the morning.   naproxen sodium (ALEVE) 220 MG tablet Take 220-440 mg by mouth daily as needed (pain).   nitroGLYCERIN (NITROSTAT) 0.4 MG SL tablet Place 1 tablet (0.4 mg total) under the tongue every 5 (five) minutes as needed for  chest pain (MAX 3 TABLETS). (Patient taking differently: Place 0.4 mg under the tongue every 5 (five) minutes x 3 doses as needed for chest pain (MAX 3 TABLETS).)   venlafaxine XR (EFFEXOR-XR) 150 MG 24 hr capsule Take 150 mg by mouth 2 (two) times daily.    vitamin B-12 (CYANOCOBALAMIN) 500 MCG tablet Take 500 mcg by mouth daily.   Current Facility-Administered Medications for the 02/09/21 encounter (Video Visit) with Sueanne Margarita, MD  Medication   sodium chloride flush (NS) 0.9 % injection 3 mL     Allergies:   Patient has no known allergies.   Social History  Tobacco Use   Smoking status: Every Day    Types: Cigars   Smokeless tobacco: Never   Tobacco comments:    uses cigarelle- 4 per day  Vaping Use   Vaping Use: Never used  Substance Use Topics   Alcohol use: No    Comment: HISTORY OF DRUG & ETOH 09/19/2004   Drug use: No    Comment: none since 09/19/2004     Family Hx: The patient's family history includes Cancer in his father; Diabetes in his mother; Suicidality in his brother.  ROS:   Please see the history of present illness.     All other systems reviewed and are negative.   Prior CV studies:   The following studies were reviewed today:  2D echo  Labs/Other Tests and Data Reviewed:    EKG:  No ECG reviewed.  Recent Labs: 08/14/2020: B Natriuretic Peptide 88.3; BUN 17; Creatinine, Ser 1.10; Hemoglobin 12.5; Platelets 282; Potassium 4.1; Sodium 138   Recent Lipid Panel Lab Results  Component Value Date/Time   CHOL 179 09/06/2019 04:14 PM   TRIG 189 (H) 09/06/2019 04:14 PM   HDL 32 (L) 09/06/2019 04:14 PM   CHOLHDL 5.6 (H) 09/06/2019 04:14 PM   CHOLHDL 5 10/21/2013 01:07 PM   LDLCALC 114 (H) 09/06/2019 04:14 PM    Wt Readings from Last 3 Encounters:  02/09/21 143 lb (64.9 kg)  08/14/20 135 lb (61.2 kg)  04/27/20 147 lb (66.7 kg)     Risk Assessment/Calculations:          Objective:    Vital Signs:  BP (!) 129/59    Pulse 79    Ht 5\' 6"   (1.676 m)    Wt 143 lb (64.9 kg)    BMI 23.08 kg/m    VITAL SIGNS:  reviewed GEN:  no acute distress EYES:  sclerae anicteric, EOMI - Extraocular Movements Intact RESPIRATORY:  normal respiratory effort, symmetric expansion CARDIOVASCULAR:  no peripheral edema SKIN:  no rash, lesions or ulcers. MUSCULOSKELETAL:  no obvious deformities. NEURO:  alert and oriented x 3, no obvious focal deficit PSYCH:  normal affect  ASSESSMENT & PLAN:    1.  ASCAD  -status post CABG in 1996.   -cath 2005 showed patent RIMA-PDA, occluded radial graft to the left system.   -he denies any anginal symptoms -continue ASA, BB and statin   2.  Ischemic DCM/ Chronic systolic CHF -echo 09/9831 showed persistence of moderately reduced LV function EF 30 to 35% with grade 1 diastolic dysfunction.  -repeat echo 11/2019 showed improved LVF with EF 35-40%  -He is status post AICD. -his weight is stable and he denies any LE edema or SOB -continue prescription drug management with Lisinopril 5mg  daily and Toprol XL 50mg  daily with PRN refills -Not a candidate for Entresto or Aldactone due to history of soft blood pressure with syncope in the past due to orthostasis.  -he has not required diuretics   3.  HTN  -BP is well controlled on exam today -continue Lisinopril 5mg  daily and Toprol XL 50mg  daily   4.  Thoracic aortic aneurysm -chest CT 08/2016 showed 4 cm ascending thoracic aortic aneurysm and 3.8cm by Chest CTA 09/2017 -2D echo 11/2019 showed ascending aorta at 62mm -repeat 2D echo to reassess -BP controlled   5.  VT s/p AICD  - followed in device clinic    6.  Hyperlipidemia  -LDL goal is less than 70. -check FLP and ALT -Continue prescription drug management with Atorvastatin  80mg  daily with PRN refills   7.  Aortic insufficiency -Mild to moderate by echo 2021 -repeat echo to make sure AI is stable  8.  PAD -he has been having a lot of leg fatigue with minimal walking -I will get him back in to  see Dr. Gwenlyn Found    COVID-19 Education: The signs and symptoms of COVID-19 were discussed with the patient and how to seek care for testing (follow up with PCP or arrange E-visit).  The importance of social distancing was discussed today.  Time:   Today, I have spent 15 minutes with the patient with telehealth technology discussing the above problems.     Medication Adjustments/Labs and Tests Ordered: Current medicines are reviewed at length with the patient today.  Concerns regarding medicines are outlined above.   Tests Ordered: No orders of the defined types were placed in this encounter.   Medication Changes: No orders of the defined types were placed in this encounter.   Follow Up:  In Person in 1 year(s)  Signed, Fransico Him, MD  02/09/2021 8:15 AM    [place]

## 2021-02-09 ENCOUNTER — Telehealth (INDEPENDENT_AMBULATORY_CARE_PROVIDER_SITE_OTHER): Payer: Federal, State, Local not specified - PPO | Admitting: Cardiology

## 2021-02-09 ENCOUNTER — Encounter: Payer: Self-pay | Admitting: Cardiology

## 2021-02-09 ENCOUNTER — Other Ambulatory Visit: Payer: Self-pay

## 2021-02-09 VITALS — BP 129/59 | HR 79 | Ht 66.0 in | Wt 143.0 lb

## 2021-02-09 DIAGNOSIS — I472 Ventricular tachycardia, unspecified: Secondary | ICD-10-CM

## 2021-02-09 DIAGNOSIS — I2510 Atherosclerotic heart disease of native coronary artery without angina pectoris: Secondary | ICD-10-CM | POA: Diagnosis not present

## 2021-02-09 DIAGNOSIS — I10 Essential (primary) hypertension: Secondary | ICD-10-CM

## 2021-02-09 DIAGNOSIS — I77810 Thoracic aortic ectasia: Secondary | ICD-10-CM

## 2021-02-09 DIAGNOSIS — I5042 Chronic combined systolic (congestive) and diastolic (congestive) heart failure: Secondary | ICD-10-CM

## 2021-02-09 DIAGNOSIS — I255 Ischemic cardiomyopathy: Secondary | ICD-10-CM | POA: Diagnosis not present

## 2021-02-09 DIAGNOSIS — E7800 Pure hypercholesterolemia, unspecified: Secondary | ICD-10-CM

## 2021-02-09 DIAGNOSIS — I351 Nonrheumatic aortic (valve) insufficiency: Secondary | ICD-10-CM

## 2021-02-09 DIAGNOSIS — I739 Peripheral vascular disease, unspecified: Secondary | ICD-10-CM

## 2021-02-09 NOTE — Addendum Note (Signed)
Addended by: Antonieta Iba on: 02/09/2021 08:41 AM   Modules accepted: Orders

## 2021-02-09 NOTE — Patient Instructions (Signed)
Medication Instructions:  Your physician recommends that you continue on your current medications as directed. Please refer to the Current Medication list given to you today.  *If you need a refill on your cardiac medications before your next appointment, please call your pharmacy*  Lab Work: Fasting lipids and CMET If you have labs (blood work) drawn today and your tests are completely normal, you will receive your results only by: [place] (if you have MyChart) OR A paper copy in the mail If you have any lab test that is abnormal or we need to change your treatment, we will call you to review the results.  Testing/Procedures: Your physician has requested that you have an echocardiogram. Echocardiography is a painless test that uses sound waves to create images of your heart. It provides your doctor with information about the size and shape of your heart and how well your hearts chambers and valves are working. This procedure takes approximately one hour. There are no restrictions for this procedure.  Follow-Up: At [facility], you and your health needs are our priority.  As part of our continuing mission to provide you with exceptional heart care, we have created designated Provider Care Teams.  These Care Teams include your primary Cardiologist (physician) and Advanced Practice Providers (APPs -  Physician Assistants and Nurse Practitioners) who all work together to provide you with the care you need, when you need it.  Your next appointment:   6 month(s)  The format for your next appointment:   In Person  Provider:   Fransico Him, MD    Other Instructions You have referred to see Dr. Gwenlyn Found

## 2021-02-12 ENCOUNTER — Encounter: Payer: Self-pay | Admitting: Cardiology

## 2021-02-12 NOTE — Telephone Encounter (Signed)
Spoke with the patient and scheduled him for his lab work that was ordered at his virtual appointment with Dr. Radford Pax last week.

## 2021-02-14 ENCOUNTER — Other Ambulatory Visit: Payer: Federal, State, Local not specified - PPO | Admitting: *Deleted

## 2021-02-14 ENCOUNTER — Other Ambulatory Visit: Payer: Self-pay

## 2021-02-14 DIAGNOSIS — I5042 Chronic combined systolic (congestive) and diastolic (congestive) heart failure: Secondary | ICD-10-CM

## 2021-02-14 DIAGNOSIS — I255 Ischemic cardiomyopathy: Secondary | ICD-10-CM

## 2021-02-14 DIAGNOSIS — I2510 Atherosclerotic heart disease of native coronary artery without angina pectoris: Secondary | ICD-10-CM

## 2021-02-14 DIAGNOSIS — I351 Nonrheumatic aortic (valve) insufficiency: Secondary | ICD-10-CM

## 2021-02-15 ENCOUNTER — Encounter: Payer: Self-pay | Admitting: Cardiology

## 2021-02-15 ENCOUNTER — Other Ambulatory Visit: Payer: Federal, State, Local not specified - PPO

## 2021-02-15 ENCOUNTER — Other Ambulatory Visit: Payer: Self-pay | Admitting: Cardiology

## 2021-02-15 LAB — LIPID PANEL
Chol/HDL Ratio: 3.2 ratio (ref 0.0–5.0)
Cholesterol, Total: 102 mg/dL (ref 100–199)
HDL: 32 mg/dL — ABNORMAL LOW (ref >39)
LDL Chol Calc (NIH): 52 mg/dL (ref 0–99)
Triglycerides: 95 mg/dL (ref 0–149)
VLDL Cholesterol Cal: 18 mg/dL (ref 5–40)

## 2021-02-15 LAB — COMPREHENSIVE METABOLIC PANEL
ALT: 15 IU/L (ref 0–44)
AST: 18 IU/L (ref 0–40)
Albumin/Globulin Ratio: 1.5 (ref 1.2–2.2)
Albumin: 4 g/dL (ref 3.8–4.8)
Alkaline Phosphatase: 194 IU/L — ABNORMAL HIGH (ref 44–121)
BUN/Creatinine Ratio: 14 (ref 10–24)
BUN: 17 mg/dL (ref 8–27)
Bilirubin Total: 0.3 mg/dL (ref 0.0–1.2)
CO2: 26 mmol/L (ref 20–29)
Calcium: 9.9 mg/dL (ref 8.6–10.2)
Chloride: 104 mmol/L (ref 96–106)
Creatinine, Ser: 1.18 mg/dL (ref 0.76–1.27)
Globulin, Total: 2.6 g/dL (ref 1.5–4.5)
Glucose: 133 mg/dL — ABNORMAL HIGH (ref 70–99)
Potassium: 4.3 mmol/L (ref 3.5–5.2)
Sodium: 149 mmol/L — ABNORMAL HIGH (ref 134–144)
Total Protein: 6.6 g/dL (ref 6.0–8.5)
eGFR: 67 mL/min/{1.73_m2} (ref >59)

## 2021-02-16 ENCOUNTER — Other Ambulatory Visit: Payer: Self-pay

## 2021-02-16 ENCOUNTER — Ambulatory Visit (HOSPITAL_COMMUNITY): Payer: Federal, State, Local not specified - PPO | Attending: Cardiovascular Disease

## 2021-02-16 DIAGNOSIS — I351 Nonrheumatic aortic (valve) insufficiency: Secondary | ICD-10-CM | POA: Insufficient documentation

## 2021-02-16 DIAGNOSIS — I255 Ischemic cardiomyopathy: Secondary | ICD-10-CM

## 2021-02-16 DIAGNOSIS — I2510 Atherosclerotic heart disease of native coronary artery without angina pectoris: Secondary | ICD-10-CM | POA: Insufficient documentation

## 2021-02-16 DIAGNOSIS — I5042 Chronic combined systolic (congestive) and diastolic (congestive) heart failure: Secondary | ICD-10-CM | POA: Insufficient documentation

## 2021-02-16 LAB — ECHOCARDIOGRAM COMPLETE
Area-P 1/2: 3.08 cm2
P 1/2 time: 354 msec
S' Lateral: 3.8 cm

## 2021-02-18 ENCOUNTER — Encounter: Payer: Self-pay | Admitting: Cardiology

## 2021-02-18 DIAGNOSIS — I77810 Thoracic aortic ectasia: Secondary | ICD-10-CM | POA: Insufficient documentation

## 2021-02-19 ENCOUNTER — Encounter: Payer: Self-pay | Admitting: Cardiology

## 2021-02-20 ENCOUNTER — Encounter: Payer: Self-pay | Admitting: Cardiology

## 2021-02-20 DIAGNOSIS — I10 Essential (primary) hypertension: Secondary | ICD-10-CM

## 2021-02-20 DIAGNOSIS — I7121 Aneurysm of the ascending aorta, without rupture: Secondary | ICD-10-CM

## 2021-02-20 DIAGNOSIS — I351 Nonrheumatic aortic (valve) insufficiency: Secondary | ICD-10-CM

## 2021-02-20 DIAGNOSIS — I77810 Thoracic aortic ectasia: Secondary | ICD-10-CM

## 2021-02-20 NOTE — Telephone Encounter (Signed)
-----   Message from Sueanne Margarita, MD sent at 02/18/2021  3:21 PM EST ----- Echo shows an increased in ascending aortic dimensions from 3.9 to 4.5cm in 2021.  There is mild LV dysfunction with EF 40-45%, mildly enalrged RV, mild to moderate leakiness of AV.   Repeat 2D echo in 1 year for AI.  Please get a gated chest CTA for further evaluation of dilated aorta

## 2021-02-20 NOTE — Telephone Encounter (Signed)
The patient has been notified of the result and verbalized understanding.  All questions (if any) were answered. Antonieta Iba, RN 02/20/2021 10:48 AM  CTA has been ordered.

## 2021-02-23 MED ORDER — METOPROLOL SUCCINATE ER 50 MG PO TB24: 50.0000 mg | ORAL_TABLET | Freq: Every day | ORAL | 3 refills | Status: DC

## 2021-02-23 NOTE — Addendum Note (Signed)
Addended by: Ma Hillock on: 02/23/2021 03:29 PM   Modules accepted: Orders

## 2021-03-02 MED ORDER — ATORVASTATIN CALCIUM 80 MG PO TABS: 80.0000 mg | ORAL_TABLET | Freq: Every day | ORAL | 3 refills | Status: DC

## 2021-03-02 NOTE — Addendum Note (Signed)
Addended by: Ma Hillock on: 03/02/2021 03:27 PM   Modules accepted: Orders

## 2021-03-08 ENCOUNTER — Ambulatory Visit (HOSPITAL_COMMUNITY)
Admission: RE | Admit: 2021-03-08 | Discharge: 2021-03-08 | Disposition: A | Discharge status: Home / Self Care | Payer: Federal, State, Local not specified - PPO | Source: Ambulatory Visit | Attending: Cardiology | Admitting: Cardiology

## 2021-03-08 ENCOUNTER — Other Ambulatory Visit: Payer: Self-pay

## 2021-03-08 ENCOUNTER — Encounter (HOSPITAL_COMMUNITY): Payer: Self-pay

## 2021-03-08 DIAGNOSIS — I77810 Thoracic aortic ectasia: Secondary | ICD-10-CM | POA: Insufficient documentation

## 2021-03-08 DIAGNOSIS — I351 Nonrheumatic aortic (valve) insufficiency: Secondary | ICD-10-CM | POA: Insufficient documentation

## 2021-03-08 DIAGNOSIS — I7121 Aneurysm of the ascending aorta, without rupture: Secondary | ICD-10-CM | POA: Insufficient documentation

## 2021-03-08 MED ORDER — IOHEXOL 350 MG/ML SOLN
80.0000 mL | Freq: Once | INTRAVENOUS | Status: AC | PRN
  Administered 2021-03-08: 80 mL via INTRAVENOUS

## 2021-03-09 ENCOUNTER — Telehealth: Payer: Self-pay | Admitting: Cardiology

## 2021-03-09 ENCOUNTER — Encounter: Payer: Self-pay | Admitting: Cardiology

## 2021-03-09 NOTE — Telephone Encounter (Signed)
Hector Margarita, MD  03/08/2021  4:38 PM EST     Stable fusiform ectasia of the ascending thoracic aorta at 3.6 cm.  There is also coronary artery calcifications and LV enlargement which are noted by prior echo and known CAD  The patient has been notified of the result and verbalized understanding.  All questions (if any) were answered. Antonieta Iba, RN 03/09/2021 5:16 PM

## 2021-03-09 NOTE — Telephone Encounter (Signed)
Patient returned call for CT scan results.

## 2021-03-13 ENCOUNTER — Ambulatory Visit (INDEPENDENT_AMBULATORY_CARE_PROVIDER_SITE_OTHER): Payer: Federal, State, Local not specified - PPO

## 2021-03-13 DIAGNOSIS — I255 Ischemic cardiomyopathy: Secondary | ICD-10-CM | POA: Diagnosis not present

## 2021-03-13 LAB — CUP PACEART REMOTE DEVICE CHECK
Date Time Interrogation Session: 20230228090806
Implantable Lead Implant Date: 20141124
Implantable Lead Location: 753860
Implantable Lead Model: 365
Implantable Lead Serial Number: 10552343
Implantable Pulse Generator Implant Date: 20141124
Pulse Gen Model: 383594
Pulse Gen Serial Number: 60740583

## 2021-03-20 NOTE — Progress Notes (Signed)
Remote ICD transmission.   

## 2021-04-02 ENCOUNTER — Telehealth: Payer: Self-pay

## 2021-04-02 NOTE — Telephone Encounter (Signed)
Biotronik monitor has not been connected within 21 days.  ? ?Attempted to contact patient to advise to plug in. No answer, LMTCB. ? ?

## 2021-04-06 ENCOUNTER — Encounter: Payer: Self-pay | Admitting: Cardiology

## 2021-04-11 ENCOUNTER — Institutional Professional Consult (permissible substitution): Payer: Federal, State, Local not specified - PPO | Admitting: Cardiovascular Disease

## 2021-05-01 ENCOUNTER — Encounter: Payer: Self-pay | Admitting: Cardiovascular Disease

## 2021-05-01 ENCOUNTER — Encounter: Payer: Self-pay | Admitting: Cardiology

## 2021-05-01 ENCOUNTER — Institutional Professional Consult (permissible substitution): Payer: Federal, State, Local not specified - PPO | Admitting: Cardiovascular Disease

## 2021-05-02 NOTE — Telephone Encounter (Signed)
Multiple encounters open, please refer to other mychart encounter to for more details. ?

## 2021-06-12 ENCOUNTER — Ambulatory Visit (INDEPENDENT_AMBULATORY_CARE_PROVIDER_SITE_OTHER): Payer: Federal, State, Local not specified - PPO

## 2021-06-12 DIAGNOSIS — I255 Ischemic cardiomyopathy: Secondary | ICD-10-CM

## 2021-06-12 LAB — CUP PACEART REMOTE DEVICE CHECK
Battery Remaining Percentage: 21 %
Battery Voltage: 2.98 V
Brady Statistic RV Percent Paced: 0 %
Date Time Interrogation Session: 20230530120712
HighPow Impedance: 70 Ohm
Implantable Lead Implant Date: 20141124
Implantable Lead Location: 753860
Implantable Lead Model: 365
Implantable Lead Serial Number: 10552343
Implantable Pulse Generator Implant Date: 20141124
Lead Channel Impedance Value: 405 Ohm
Lead Channel Pacing Threshold Amplitude: 1.5 V
Lead Channel Pacing Threshold Pulse Width: 0.4 ms
Lead Channel Sensing Intrinsic Amplitude: 10.3 mV
Lead Channel Sensing Intrinsic Amplitude: 8 mV
Lead Channel Setting Pacing Amplitude: 2.5 V
Lead Channel Setting Pacing Pulse Width: 0.4 ms
Lead Channel Setting Sensing Sensitivity: 0.8 mV
Pulse Gen Model: 383594
Pulse Gen Serial Number: 60740583

## 2021-06-13 ENCOUNTER — Encounter: Payer: Self-pay | Admitting: Cardiology

## 2021-06-28 NOTE — Progress Notes (Signed)
Remote ICD transmission.   
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
## 2021-07-11 ENCOUNTER — Ambulatory Visit (INDEPENDENT_AMBULATORY_CARE_PROVIDER_SITE_OTHER): Payer: Federal, State, Local not specified - PPO | Admitting: Physician Assistant

## 2021-07-11 ENCOUNTER — Encounter: Payer: Self-pay | Admitting: Physician Assistant

## 2021-07-11 VITALS — BP 112/58 | HR 72 | Ht 67.0 in | Wt 134.2 lb

## 2021-07-11 DIAGNOSIS — I255 Ischemic cardiomyopathy: Secondary | ICD-10-CM

## 2021-07-11 DIAGNOSIS — Z95810 Presence of automatic (implantable) cardiac defibrillator: Secondary | ICD-10-CM | POA: Diagnosis not present

## 2021-07-11 DIAGNOSIS — I10 Essential (primary) hypertension: Secondary | ICD-10-CM | POA: Diagnosis not present

## 2021-07-11 DIAGNOSIS — I2510 Atherosclerotic heart disease of native coronary artery without angina pectoris: Secondary | ICD-10-CM | POA: Diagnosis not present

## 2021-07-11 LAB — CUP PACEART INCLINIC DEVICE CHECK
Date Time Interrogation Session: 20230628192601
Implantable Lead Implant Date: 20141124
Implantable Lead Location: 753860
Implantable Lead Model: 365
Implantable Lead Serial Number: 10552343
Implantable Pulse Generator Implant Date: 20141124
Lead Channel Pacing Threshold Amplitude: 1.5 V
Lead Channel Pacing Threshold Pulse Width: 0.4 ms
Lead Channel Sensing Intrinsic Amplitude: 14.2 mV
Lead Channel Sensing Intrinsic Amplitude: 8.4 mV
Pulse Gen Model: 383594
Pulse Gen Serial Number: 60740583

## 2021-07-11 NOTE — Patient Instructions (Signed)
Medication Instructions:  Your physician recommends that you continue on your current medications as directed. Please refer to the Current Medication list given to you today.  *If you need a refill on your cardiac medications before your next appointment, please call your pharmacy*   Lab Work: None ordered  If you have labs (blood work) drawn today and your tests are completely normal, you will receive your results only by: [place] (if you have MyChart) OR A paper copy in the mail If you have any lab test that is abnormal or we need to change your treatment, we will call you to review the results.   Testing/Procedures: None ordered   Follow-Up: At [facility], you and your health needs are our priority.  As part of our continuing mission to provide you with exceptional heart care, we have created designated Provider Care Teams.  These Care Teams include your primary Cardiologist (physician) and Advanced Practice Providers (APPs -  Physician Assistants and Nurse Practitioners) who all work together to provide you with the care you need, when you need it.  We recommend signing up for the patient portal called "MyChart".  Sign up information is provided on this After Visit Summary.  MyChart is used to connect with patients for Virtual Visits (Telemedicine).  Patients are able to view lab/test results, encounter notes, upcoming appointments, etc.  Non-urgent messages can be sent to your provider as well.   To learn more about what you can do with MyChart, go to [email or web address].    Your next appointment:   12 month(s)  The format for your next appointment:   In Person  Provider:   Cristopher Peru, MD or Tommye Standard, PA-C    Other Instructions   Important Information About Sugar      Your physician has requested that you have a cardiac catheterization. Cardiac catheterization is used to diagnose and/or treat various heart conditions. Doctors may recommend this  procedure for a number of different reasons. The most common reason is to evaluate chest pain. Chest pain can be a symptom of coronary artery disease (CAD), and cardiac catheterization can show whether plaque is narrowing or blocking your heart's arteries. This procedure is also used to evaluate the valves, as well as measure the blood flow and oxygen levels in different parts of your heart. For further information please visit [email or web address]. Please follow instruction sheet, as given.

## 2021-07-12 ENCOUNTER — Telehealth: Payer: Self-pay

## 2021-07-12 NOTE — Telephone Encounter (Signed)
Clearance to resume driving letter has been faxed to [facility] and original copy mailed to pt's home address.

## 2021-09-11 ENCOUNTER — Ambulatory Visit (INDEPENDENT_AMBULATORY_CARE_PROVIDER_SITE_OTHER): Payer: Federal, State, Local not specified - PPO

## 2021-09-11 DIAGNOSIS — I255 Ischemic cardiomyopathy: Secondary | ICD-10-CM

## 2021-09-11 LAB — CUP PACEART REMOTE DEVICE CHECK
Date Time Interrogation Session: 20230829083853
Implantable Lead Implant Date: 20141124
Implantable Lead Location: 753860
Implantable Lead Model: 365
Implantable Lead Serial Number: 10552343
Implantable Pulse Generator Implant Date: 20141124
Pulse Gen Model: 383594
Pulse Gen Serial Number: 60740583

## 2021-09-21 ENCOUNTER — Ambulatory Visit: Payer: Federal, State, Local not specified - PPO | Admitting: Cardiology

## 2021-09-24 ENCOUNTER — Ambulatory Visit: Payer: Federal, State, Local not specified - PPO | Attending: Cardiology | Admitting: Physician Assistant

## 2021-09-24 ENCOUNTER — Encounter: Payer: Self-pay | Admitting: Physician Assistant

## 2021-09-24 VITALS — BP 120/66 | HR 75 | Ht 68.0 in | Wt 127.0 lb

## 2021-09-24 DIAGNOSIS — I739 Peripheral vascular disease, unspecified: Secondary | ICD-10-CM | POA: Diagnosis not present

## 2021-09-24 DIAGNOSIS — E7800 Pure hypercholesterolemia, unspecified: Secondary | ICD-10-CM | POA: Diagnosis not present

## 2021-09-24 DIAGNOSIS — Z720 Tobacco use: Secondary | ICD-10-CM

## 2021-09-24 DIAGNOSIS — I255 Ischemic cardiomyopathy: Secondary | ICD-10-CM

## 2021-09-24 DIAGNOSIS — I10 Essential (primary) hypertension: Secondary | ICD-10-CM

## 2021-09-24 DIAGNOSIS — I77810 Thoracic aortic ectasia: Secondary | ICD-10-CM

## 2021-09-24 NOTE — Patient Instructions (Addendum)
Medication Instructions:  Your physician recommends that you continue on your current medications as directed. Please refer to the Current Medication list given to you today. *If you need a refill on your cardiac medications before your next appointment, please call your pharmacy*   Lab Work: None ordered   Testing/Procedures: Your physician has requested that you have a lower extremity arterial exercise duplex. During this test, exercise and ultrasound are used to evaluate arterial blood flow in the legs. Allow one hour for this exam. There are no restrictions or special instructions.  Your physician has requested that you have an ankle brachial index (ABI). During this test an ultrasound and blood pressure cuff are used to evaluate the arteries that supply the arms and legs with blood. Allow thirty minutes for this exam. There are no restrictions or special instructions. NEEDS APPOINTMENT WITH DR Gwenlyn Found AFTER STUDY  Follow-Up: At [facility], you and your health needs are our priority.  As part of our continuing mission to provide you with exceptional heart care, we have created designated Provider Care Teams.  These Care Teams include your primary Cardiologist (physician) and Advanced Practice Providers (APPs -  Physician Assistants and Nurse Practitioners) who all work together to provide you with the care you need, when you need it.  We recommend signing up for the patient portal called "MyChart".  Sign up information is provided on this After Visit Summary.  MyChart is used to connect with patients for Virtual Visits (Telemedicine).  Patients are able to view lab/test results, encounter notes, upcoming appointments, etc.  Non-urgent messages can be sent to your provider as well.   To learn more about what you can do with MyChart, go to [email or web address].    Your next appointment:    6 month(s)  The format for your next appointment:   In Person  Provider:   Fransico Him, MD     Other Instructions   Important Information About Sugar

## 2021-09-24 NOTE — Progress Notes (Signed)
Cardiology Office Note:    Date:  09/24/2021   ID:  Hector Byrd, DOB September 18, 1955, MRN [id number]  PCP:  [name], NP-C  [facility] Cardiologist:  Fransico Him, MD  [facility] Electrophysiologist:  Cristopher Peru, MD   Chief Complaint: 6 month follow up   History of Present Illness:    Hector Byrd is a 66 y.o. male with a hx of CAD status post CABG in 1996, dyslipidemia, ischemic dilated cardiomyopathy with chronic systolic CHF s/p AICD in 7858, aortic regurgitation, thoracic aortic aneurysm, PVD, and HTN with orthostatic hypotension and tobacco abuse seen for follow up.   Cath in 2005 showed patent RIMA-PDA, occluded radial graft to the left system.   Hx of ICM with AICD implantation in 2014.  Echo 06/2016: LVEF 30-35% Echo 11/2019: LVEF 35-40%  Hx of PVD with doppler studies performed 12/21/2019 revealed a right ABI of 0.61 and a left of 0.79.  He had a high-frequency signal in his right external iliac artery and left common iliac artery. Had right greater than left lower extremity claudication., scheduled endovascular therapy but never completed.   Last echo 02/2021: improved LVEF to 40-45%, worsen ascending aorta dilation at 54m From 3.9cm, mild to moderate AI.   Patient is here for follow-up.  Instead of smoking cigarettes he is smoking few small cigar.  He gets lower extremity weakness after walking about 1.5 block.  Denies chest pain, shortness of breath, orthopnea, PND, syncope, lower extremity edema or melena.  Reported low appetite and weight loss.  Past Medical History:  Diagnosis Date   AAA [place]dominal aortic aneurysm) ([place]    3.0cm by duplex 12/2019   AICD (automatic cardioverter/defibrillator) present    Anxiety    Aortic insufficiency    mild to moderate by echo 02/2021   Arthritis    Ascending aorta dilatation (HCC)    4.5cm by echo 02/2021 and 3.6 cm by chest CTA   CAD (coronary artery disease)    a. s/p stent x2 and then CABG 1996.   Cancer of kidney  ([facility]    Kidney    Cardiomyopathy, ischemic    Chronic combined systolic (congestive) and diastolic (congestive) heart failure ([facility] 07/07/2017   Depression    Dilated aortic root (HCC)    Dyslipidemia    Dyspnea    with exertion   Erectile dysfunction    Fatty liver 11/04/2019   GERD (gastroesophageal reflux disease)    Headache    Hypertension    Kidney cancer, primary, with metastasis from kidney to other site ([facility]    Orthostasis    Pneumonia    Restless legs    not bothered by this anymore   Rotator cuff tear    S/P CABG (coronary artery bypass graft)    Ventricular tachycardia ([facility]     Past Surgical History:  Procedure Laterality Date   CARDIAC CATHETERIZATION     Cardiac stents     prior to 2005- 3 times total   COLONOSCOPY     CORONARY ARTERY BYPASS GRAFT  2005   CORONARY STENT PLACEMENT     ICD LEAD REMOVAL N/A 10/02/2016   Procedure: ICD LEAD REMOVAL WITH IMPLANTATION OF BIOTRONIK PLEXA PROMRI DF-1 S DX 65/17 SN [id number]  Surgeon: TEvans Lance MD;  [place]  Service: Cardiovascular;  Laterality: N/A;  [place] to back up   [place]   BTK single chamber ICD implanted by Dr TRayetta PiggCARDIOVERTER DEFIBRILLATOR  IMPLANT N/A 12/07/2012   Procedure: IMPLANTABLE CARDIOVERTER DEFIBRILLATOR IMPLANT;  Surgeon: Evans Lance, MD;  Location: [facility] C[facility] LAB;  Service: Cardiovascular;  Laterality: N/A;   Kidney resection     small amount of kidney   ROTATOR CUFF REPAIR     TEE WITHOUT CARDIOVERSION N/A 10/02/2016   Procedure: TRANSESOPHAGEAL ECHOCARDIOGRAM (TEE);  Surgeon: Evans Lance, MD;  Location: [facility] OR;  Service: Cardiovascular;  Laterality: N/A;    Current Medications: Current Meds  Medication Sig   aspirin EC 81 MG tablet Take 81 mg by mouth daily.    atorvastatin (LIPITOR) 80 MG tablet Take 1 tablet (80 mg total) by mouth daily.   Buprenorphine HCl-Naloxone HCl 8-2 MG FILM Place 0.5-1 Film  under the tongue See admin instructions. Take 1 film sublingual in the morning, take 0.5 film sublingual at 1300 & take 1 film sublingual at supper   Cholecalciferol (VITAMIN D3) 50 MCG (2000 UT) TABS Take 2,000 Units by mouth daily.   clonazePAM (KLONOPIN) 1 MG tablet Take 1-3 mg by mouth 2 (two) times daily. Take 1 tablet (1 mg) by mouth in the morning & take 2.5 tablets (2.5 mg) by mouth at bedtime.   lamoTRIgine (LAMICTAL) 100 MG tablet Take 100 mg by mouth 2 (two) times daily.   lisinopril (ZESTRIL) 5 MG tablet Take 1 tablet (5 mg total) by mouth daily. Please make overdue appt with Dr. Radford Pax before anymore refills. Thank you 2nd attempt   metoprolol succinate (TOPROL-XL) 50 MG 24 hr tablet Take 1 tablet (50 mg total) by mouth daily. Take with or immediately following a meal.   naphazoline-pheniramine (NAPHCON-A) 0.025-0.3 % ophthalmic solution Place 2 drops into both eyes in the morning.   naproxen sodium (ALEVE) 220 MG tablet Take 220-440 mg by mouth daily as needed (pain).   nitroGLYCERIN (NITROSTAT) 0.4 MG SL tablet Place 1 tablet (0.4 mg total) under the tongue every 5 (five) minutes as needed for chest pain (MAX 3 TABLETS).   venlafaxine XR (EFFEXOR-XR) 150 MG 24 hr capsule Take 150 mg by mouth 2 (two) times daily.    vitamin B-12 (CYANOCOBALAMIN) 500 MCG tablet Take 500 mcg by mouth daily.   Current Facility-Administered Medications for the 09/24/21 encounter (Office Visit) with Leanor Kail, PA  Medication   sodium chloride flush (NS) 0.9 % injection 3 mL     Allergies:   Patient has no known allergies.   Social History   Socioeconomic History   Marital status: Single    Spouse name: Not on file   Number of children: Not on file   Years of education: Not on file   Highest education level: Not on file  Occupational History   Not on file  Tobacco Use   Smoking status: Every Day    Types: Cigars   Smokeless tobacco: Never   Tobacco comments:    uses cigarelle- 4 per  day  Vaping Use   Vaping Use: Never used  Substance and Sexual Activity   Alcohol use: No    Comment: HISTORY OF DRUG & ETOH 09/19/2004   Drug use: No    Comment: none since 09/19/2004   Sexual activity: Never  Other Topics Concern   Not on file  Social History Narrative   Not on file   Social Determinants of Health   Financial Resource Strain: Not on file  Food Insecurity: Not on file  Transportation Needs: Not on file  Physical Activity: Not on file  Stress: Not on file  Social Connections: Not on file     Family History: The patient's family history includes Cancer in his father; Diabetes in his mother; Suicidality in his brother.    ROS:   Please see the history of present illness.    All other systems reviewed and are negative.   EKGs/Labs/Other Studies Reviewed:    The following studies were reviewed today:  Echo 02/2021 1. Compared with the echo 11/2019, ascending aorta is now 4.5 cm,  increased from 3.9 cm previously.   2. Akinesis of the basal to mid inferoseptal and inferior myocardium.  Mid-apical posterolateral hypokinesis. Left ventricular ejection fraction,  by estimation, is 40 to 45%. The left ventricle has mildly decreased  function. The left ventricle  demonstrates regional wall motion abnormalities (see scoring  diagram/findings for description). Left ventricular diastolic parameters  are consistent with Grade I diastolic dysfunction (impaired relaxation).  The average left ventricular global  longitudinal strain is -17.1 %.   3. Right ventricular systolic function is normal. The right ventricular  size is mildly enlarged.   4. The mitral valve is normal in structure. Trivial mitral valve  regurgitation. No evidence of mitral stenosis.   5. The aortic valve is tricuspid. There is mild calcification of the  aortic valve. There is mild thickening of the aortic valve. Aortic valve  regurgitation is mild to moderate. No aortic stenosis is present. Aortic   regurgitation PHT measures 354 msec.   6. Aortic dilatation noted. There is moderate dilatation of the ascending  aorta, measuring 45 mm.   7. The inferior vena cava is normal in size with greater than 50%  respiratory variability, suggesting right atrial pressure of 3 mmHg.  EKG:  EKG is not ordered today.   Recent Labs: 02/14/2021: ALT 15; BUN 17; Creatinine, Ser 1.18; Potassium 4.3; Sodium 149  Recent Lipid Panel    Component Value Date/Time   CHOL 102 02/14/2021 1259   TRIG 95 02/14/2021 1259   HDL 32 (L) 02/14/2021 1259   CHOLHDL 3.2 02/14/2021 1259   CHOLHDL 5 10/21/2013 1307   VLDL 35.8 10/21/2013 1307   LDLCALC 52 02/14/2021 1259     Physical Exam:    VS:  BP 120/66   Pulse 75   Ht '5\' 8"'$  (1.727 m)   Wt 127 lb (57.6 kg)   SpO2 96%   BMI 19.31 kg/m     Wt Readings from Last 3 Encounters:  09/24/21 127 lb (57.6 kg)  07/11/21 134 lb 3.2 oz (60.9 kg)  02/09/21 143 lb (64.9 kg)     GEN: Thin frail elderly appearing than stated age in no acute distress  HEENT: Normal NECK: No JVD; No carotid bruits LYMPHATICS: No lymphadenopathy CARDIAC: RRR, no murmurs, rubs, gallops RESPIRATORY:  Clear to auscultation without rales, wheezing or rhonchi  ABDOMEN: Soft, non-tender, non-distended MUSCULOSKELETAL:  No edema; No deformity  SKIN: Warm and dry NEUROLOGIC:  Alert and oriented x 3 PSYCHIATRIC:  Normal affect   ASSESSMENT AND PLAN:    CAD -No angina.  Continue aspirin, statin and beta-blocker.  2. ICD - EF improved to 40-45% 02/2021.  Euvolemic.  Continue lisinopril and Toprol-XL at current dose.  3. AI and dilatation of ascending aorta  - worsen ascending aorta dilatation at 22m From 3.9cm, mild to moderate AI by echo 02/2021 - Repeat in 1 yr  4. PVD - Followed by Dr. BGwenlyn Found- He is due to repeat study -Has claudication symptoms right greater than left  5. HTN Blood pressure  stable and controlled on current medication  6. HLD -Continue Lipitor 80 mg  daily  7.  Tobacco smoking -Recommended complete cessation.  He has cut back significantly.  8.  Weight loss -Recommended follow-up with PCP.  Small frequent meal recommended.   Medication Adjustments/Labs and Tests Ordered: Current medicines are reviewed at length with the patient today.  Concerns regarding medicines are outlined above.  Orders Placed This Encounter  Procedures   VAS [place] ABI WITH/WO TBI   VAS [place] LOWER EXTREMITY ARTERIAL DUPLEX   No orders of the defined types were placed in this encounter.   Patient Instructions  Medication Instructions:  Your physician recommends that you continue on your current medications as directed. Please refer to the Current Medication list given to you today. *If you need a refill on your cardiac medications before your next appointment, please call your pharmacy*   Lab Work: None ordered   Testing/Procedures: Your physician has requested that you have a lower extremity arterial exercise duplex. During this test, exercise and ultrasound are used to evaluate arterial blood flow in the legs. Allow one hour for this exam. There are no restrictions or special instructions.  Your physician has requested that you have an ankle brachial index (ABI). During this test an ultrasound and blood pressure cuff are used to evaluate the arteries that supply the arms and legs with blood. Allow thirty minutes for this exam. There are no restrictions or special instructions. NEEDS APPOINTMENT WITH DR Gwenlyn Found AFTER STUDY  Follow-Up: At [facility], you and your health needs are our priority.  As part of our continuing mission to provide you with exceptional heart care, we have created designated Provider Care Teams.  These Care Teams include your primary Cardiologist (physician) and Advanced Practice Providers (APPs -  Physician Assistants and Nurse Practitioners) who all work together to provide you with the care you need, when you need it.  We  recommend signing up for the patient portal called "MyChart".  Sign up information is provided on this After Visit Summary.  MyChart is used to connect with patients for Virtual Visits (Telemedicine).  Patients are able to view lab/test results, encounter notes, upcoming appointments, etc.  Non-urgent messages can be sent to your provider as well.   To learn more about what you can do with MyChart, go to [email or web address].    Your next appointment:    6 month(s)  The format for your next appointment:   In Person  Provider:   Fransico Him, MD     Other Instructions   Important Information About Sugar         Jarrett Soho, [place]  09/24/2021 2:54 PM    [place]

## 2021-10-05 NOTE — Progress Notes (Signed)
Remote ICD transmission.   

## 2021-10-16 ENCOUNTER — Other Ambulatory Visit (HOSPITAL_COMMUNITY): Payer: Self-pay | Admitting: Physician Assistant

## 2021-10-16 ENCOUNTER — Ambulatory Visit (HOSPITAL_COMMUNITY)
Admission: RE | Admit: 2021-10-16 | Discharge: 2021-10-16 | Disposition: A | Discharge status: Home / Self Care | Payer: Federal, State, Local not specified - PPO | Source: Ambulatory Visit | Attending: Cardiology | Admitting: Cardiology

## 2021-10-16 DIAGNOSIS — I714 Abdominal aortic aneurysm, without rupture, unspecified: Secondary | ICD-10-CM

## 2021-10-16 DIAGNOSIS — I739 Peripheral vascular disease, unspecified: Secondary | ICD-10-CM | POA: Diagnosis present

## 2021-10-29 ENCOUNTER — Encounter: Payer: Self-pay | Admitting: Cardiology

## 2021-11-06 ENCOUNTER — Ambulatory Visit: Payer: Federal, State, Local not specified - PPO | Admitting: Cardiovascular Disease

## 2021-11-06 ENCOUNTER — Ambulatory Visit (HOSPITAL_COMMUNITY): Admission: RE | Admit: 2021-11-06 | Payer: Federal, State, Local not specified - PPO | Source: Ambulatory Visit

## 2021-11-20 ENCOUNTER — Telehealth: Payer: Self-pay | Admitting: Cardiology

## 2021-11-20 DIAGNOSIS — I739 Peripheral vascular disease, unspecified: Secondary | ICD-10-CM

## 2021-11-20 NOTE — Telephone Encounter (Signed)
Patient called stating the last time he had the AAA procedure he had embarrassing diarrhea.  Patient stated he is concerned this will happen again and would like to know how to best prepare for this test.

## 2021-11-20 NOTE — Telephone Encounter (Signed)
Pt stated that he was instructed to take gas-x prior to AAA duplex Friday. He does NOT want to do this, says this will cause more problems. Aware he does NOT have to take the gas-x. Pt relieved with this news. Pt will continue w/ AAA duplex scheduled this Friday.

## 2021-11-20 NOTE — Telephone Encounter (Signed)
Reached out to by Venida Jarvis, RN for help with pt scheduled for AAA duplex. Upon further review pt needs Aorta/IVC/Iliac dopplers to assess iliac stenosis. Placed order for Aorta/IVC/Iliac dopplers. Will forward to Vascular scheduler to change order association.

## 2021-11-20 NOTE — Addendum Note (Signed)
Addended by: Beatrix Fetters on: 11/20/2021 05:09 PM   Modules accepted: Orders

## 2021-11-20 NOTE — Telephone Encounter (Signed)
Patient called back to follow-up on advice regarding taking his AAA test.

## 2021-11-23 ENCOUNTER — Ambulatory Visit (HOSPITAL_COMMUNITY)
Admission: RE | Admit: 2021-11-23 | Discharge: 2021-11-23 | Disposition: A | Discharge status: Home / Self Care | Payer: Federal, State, Local not specified - PPO | Source: Ambulatory Visit | Attending: Physician Assistant | Admitting: Physician Assistant

## 2021-11-23 DIAGNOSIS — I714 Abdominal aortic aneurysm, without rupture, unspecified: Secondary | ICD-10-CM | POA: Diagnosis present

## 2021-11-28 ENCOUNTER — Encounter: Payer: Self-pay | Admitting: Internal Medicine

## 2021-11-29 ENCOUNTER — Ambulatory Visit: Payer: Federal, State, Local not specified - PPO | Attending: Cardiovascular Disease | Admitting: Cardiovascular Disease

## 2021-11-29 ENCOUNTER — Encounter: Payer: Self-pay | Admitting: Cardiovascular Disease

## 2021-11-29 VITALS — BP 110/56 | HR 82 | Ht 67.0 in | Wt 121.4 lb

## 2021-11-29 DIAGNOSIS — I739 Peripheral vascular disease, unspecified: Secondary | ICD-10-CM | POA: Diagnosis not present

## 2021-11-29 NOTE — H&P (View-Only) (Signed)
11/29/2021 [name]   07/11/1955  [id number]  Primary Physician Hector Rm, NP-C Primary Cardiologist: Hector Byrd Hector Byrd, [name]:  Hector Byrd is a 66 y.o.  seen appearing single Caucasian male with no children referred by Mack Hook NP for peripheral vascular valuation because of claudication.  His cardiologist is Dr. Fransico Byrd.  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  He has treated hypertension and hyperlipidemia.  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  He has moderately severe LV dysfunction status post ICD implantation followed by Hector Byrd.  He complains of bilateral lower extremity claudication right worse than left which is lifestyle limiting.  Doppler studies performed 12/21/2019 revealed a right ABI of 0.61 and a left of 0.79.  He had a high-frequency signal in his right external iliac artery and left common iliac artery.   Since I saw Byrd a year and a half ago he had progressive lifestyle-limiting claudication.  Recent Doppler studies performed 11/23/2021 revealed a right ABI of 0.83 and a left of 1.05.  He did have a high-frequency signal in his right extrailiac artery and a mildly elevated signal in his left common femoral artery.  He wishes to proceed with outpatient diagnostic peripheral angiography and potential endovascular therapy.   Current Meds  Medication Sig   aspirin EC 81 MG tablet Take 81 mg by mouth daily.    atorvastatin (LIPITOR) 80 MG tablet Take 1 tablet (80 mg total) by mouth daily.   Buprenorphine HCl-Naloxone HCl 8-2 MG FILM Place 0.5-1 Film under the tongue See admin instructions. Take 1 film sublingual in the morning, take 0.5 film sublingual at 1300 & take 1 film sublingual at supper    Cholecalciferol (VITAMIN D3) 50 MCG (2000 UT) TABS Take 2,000 Units by mouth daily.   clonazePAM (KLONOPIN) 1 MG tablet Take 1-3 mg by mouth 2 (two) times daily. Take 1 tablet (1 mg) by mouth in the morning & take 2.5 tablets (2.5 mg) by mouth at bedtime.   lamoTRIgine (LAMICTAL) 100 MG tablet Take 100 mg by mouth 2 (two) times daily.   lisinopril (ZESTRIL) 5 MG tablet Take 1 tablet (5 mg total) by mouth daily. Please make overdue appt with Hector Byrd before anymore refills. Thank you 2nd attempt   metoprolol succinate (TOPROL-XL) 50 MG 24 hr tablet Take 1 tablet (50 mg total) by mouth daily. Take with or immediately following a meal.   naphazoline-pheniramine (NAPHCON-A) 0.025-0.3 % ophthalmic solution Place 2 drops into both eyes in the morning.   naproxen sodium (ALEVE) 220 MG tablet Take 220-440 mg by mouth daily as needed (pain).   nitroGLYCERIN (NITROSTAT) 0.4 MG SL tablet Place 1 tablet (0.4 mg total) under the tongue every 5 (five) minutes as needed for chest pain (MAX 3 TABLETS).   venlafaxine XR (EFFEXOR-XR) 150 MG 24 hr capsule Take 150 mg by mouth 2 (two) times daily.    vitamin B-12 (CYANOCOBALAMIN) 500 MCG tablet Take 500 mcg by mouth daily.   Current Facility-Administered Medications for the 11/29/21 encounter (Office Visit) with Hector Harp, Byrd  Medication   sodium chloride flush (NS) 0.9 % injection 3 mL     No Known Allergies  Social History  Socioeconomic History   Marital status: Single    Spouse name: Not on file   Number of children: Not on file   Years of education: Not on file   Highest education level: Not on file  Occupational History   Not on file  Tobacco Use   Smoking status: Every Day    Types: Cigars   Smokeless tobacco: Never   Tobacco comments:    uses cigarelle- 4 per day  Vaping Use   Vaping Use: Never used  Substance and Sexual Activity   Alcohol use: No    Comment: HISTORY OF DRUG & ETOH 09/19/2004   Drug use: No    Comment: none since  09/19/2004   Sexual activity: Never  Other Topics Concern   Not on file  Social History Narrative   Not on file   Social Determinants of Health   Financial Resource Strain: Not on file  Food Insecurity: Not on file  Transportation Needs: Not on file  Physical Activity: Not on file  Stress: Not on file  Social Connections: Not on file  Intimate Partner Violence: Not on file     Review of Systems: General: negative for chills, fever, night sweats or weight changes.  Cardiovascular: negative for chest pain, dyspnea on exertion, edema, orthopnea, palpitations, paroxysmal nocturnal dyspnea or shortness of breath Dermatological: negative for rash Respiratory: negative for cough or wheezing Urologic: negative for hematuria Abdominal: negative for nausea, vomiting, diarrhea, bright red blood per rectum, melena, or hematemesis Neurologic: negative for visual changes, syncope, or dizziness All other systems reviewed and are otherwise negative except as noted above.    Blood pressure (!) 110/56, pulse 82, height '5\' 7"'$  (1.702 m), weight 121 lb 6.4 oz (55.1 kg), SpO2 98 %.  General appearance: alert and no distress Neck: no adenopathy, no carotid bruit, no JVD, supple, symmetrical, trachea midline, and thyroid not enlarged, symmetric, no tenderness/mass/nodules Lungs: clear to auscultation bilaterally Heart: regular rate and rhythm, S1, S2 normal, no murmur, click, rub or gallop Extremities: extremities normal, atraumatic, no cyanosis or edema Pulses: 2+ and symmetric Skin: Skin color, texture, turgor normal. No rashes or lesions Neurologic: Grossly normal  EKG not performed today  ASSESSMENT AND PLAN:   Peripheral arterial disease [employer]) Hector Byrd returns today for follow-up of his PAD.  He has lifestyle-limiting claudication right greater than left.  Recent Doppler studies performed 11/23/2021 revealed a right ABI of 0.83 and a left of 1.05.  He had a high-frequency signal in his right  extrailiac artery and a moderately elevated signal in his left common iliac artery.  He wishes to proceed with outpatient diagnostic peripheral angiography and potential endovascular therapy for claudication.     Hector Byrd FACP,FACC,FAHA, [facility] 11/29/2021 3:13 PM

## 2021-11-29 NOTE — Assessment & Plan Note (Signed)
Mr. Vasques returns today for follow-up of his PAD.  He has lifestyle-limiting claudication right greater than left.  Recent Doppler studies performed 11/23/2021 revealed a right ABI of 0.83 and a left of 1.05.  He had a high-frequency signal in his right extrailiac artery and a moderately elevated signal in his left common iliac artery.  He wishes to proceed with outpatient diagnostic peripheral angiography and potential endovascular therapy for claudication.

## 2021-11-29 NOTE — Addendum Note (Signed)
Addended by: Beatrix Fetters on: 11/29/2021 03:29 PM   Modules accepted: Orders

## 2021-11-29 NOTE — Progress Notes (Signed)
11/29/2021 Hector Byrd   1955-05-31  [id number]  Primary Physician Hector Rm, NP-C Primary Cardiologist: Lorretta Harp MD Hector Byrd, Hector Byrd  HPI:  Hector Byrd is a 66 y.o.  seen appearing single Caucasian male with no children referred by Mack Hook NP for peripheral vascular valuation because of claudication.  His cardiologist is Dr. Fransico Him.  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  He has treated hypertension and hyperlipidemia.  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  He has moderately severe LV dysfunction status post ICD implantation followed by Dr. Lovena Le.  He complains of bilateral lower extremity claudication right worse than left which is lifestyle limiting.  Doppler studies performed 12/21/2019 revealed a right ABI of 0.61 and a left of 0.79.  He had a high-frequency signal in his right external iliac artery and left common iliac artery.   Since I saw him a year and a half ago he had progressive lifestyle-limiting claudication.  Recent Doppler studies performed 11/23/2021 revealed a right ABI of 0.83 and a left of 1.05.  He did have a high-frequency signal in his right extrailiac artery and a mildly elevated signal in his left common femoral artery.  He wishes to proceed with outpatient diagnostic peripheral angiography and potential endovascular therapy.   Current Meds  Medication Sig   aspirin EC 81 MG tablet Take 81 mg by mouth daily.    atorvastatin (LIPITOR) 80 MG tablet Take 1 tablet (80 mg total) by mouth daily.   Buprenorphine HCl-Naloxone HCl 8-2 MG FILM Place 0.5-1 Film under the tongue See admin instructions. Take 1 film sublingual in the morning, take 0.5 film sublingual at 1300 & take 1 film sublingual at supper    Cholecalciferol (VITAMIN D3) 50 MCG (2000 UT) TABS Take 2,000 Units by mouth daily.   clonazePAM (KLONOPIN) 1 MG tablet Take 1-3 mg by mouth 2 (two) times daily. Take 1 tablet (1 mg) by mouth in the morning & take 2.5 tablets (2.5 mg) by mouth at bedtime.   lamoTRIgine (LAMICTAL) 100 MG tablet Take 100 mg by mouth 2 (two) times daily.   lisinopril (ZESTRIL) 5 MG tablet Take 1 tablet (5 mg total) by mouth daily. Please make overdue appt with Dr. Radford Pax before anymore refills. Thank you 2nd attempt   metoprolol succinate (TOPROL-XL) 50 MG 24 hr tablet Take 1 tablet (50 mg total) by mouth daily. Take with or immediately following a meal.   naphazoline-pheniramine (NAPHCON-A) 0.025-0.3 % ophthalmic solution Place 2 drops into both eyes in the morning.   naproxen sodium (ALEVE) 220 MG tablet Take 220-440 mg by mouth daily as needed (pain).   nitroGLYCERIN (NITROSTAT) 0.4 MG SL tablet Place 1 tablet (0.4 mg total) under the tongue every 5 (five) minutes as needed for chest pain (MAX 3 TABLETS).   venlafaxine XR (EFFEXOR-XR) 150 MG 24 hr capsule Take 150 mg by mouth 2 (two) times daily.    vitamin B-12 (CYANOCOBALAMIN) 500 MCG tablet Take 500 mcg by mouth daily.   Current Facility-Administered Medications for the 11/29/21 encounter (Office Visit) with Lorretta Harp, MD  Medication   sodium chloride flush (NS) 0.9 % injection 3 mL     No Known Allergies  Social History  Socioeconomic History   Marital status: Single    Spouse name: Not on file   Number of children: Not on file   Years of education: Not on file   Highest education level: Not on file  Occupational History   Not on file  Tobacco Use   Smoking status: Every Day    Types: Cigars   Smokeless tobacco: Never   Tobacco comments:    uses cigarelle- 4 per day  Vaping Use   Vaping Use: Never used  Substance and Sexual Activity   Alcohol use: No    Comment: HISTORY OF DRUG & ETOH 09/19/2004   Drug use: No    Comment: none since  09/19/2004   Sexual activity: Never  Other Topics Concern   Not on file  Social History Narrative   Not on file   Social Determinants of Health   Financial Resource Strain: Not on file  Food Insecurity: Not on file  Transportation Needs: Not on file  Physical Activity: Not on file  Stress: Not on file  Social Connections: Not on file  Intimate Partner Violence: Not on file     Review of Systems: General: negative for chills, fever, night sweats or weight changes.  Cardiovascular: negative for chest pain, dyspnea on exertion, edema, orthopnea, palpitations, paroxysmal nocturnal dyspnea or shortness of breath Dermatological: negative for rash Respiratory: negative for cough or wheezing Urologic: negative for hematuria Abdominal: negative for nausea, vomiting, diarrhea, bright red blood per rectum, melena, or hematemesis Neurologic: negative for visual changes, syncope, or dizziness All other systems reviewed and are otherwise negative except as noted above.    Blood pressure (!) 110/56, pulse 82, height '5\' 7"'$  (1.702 m), weight 121 lb 6.4 oz (55.1 kg), SpO2 98 %.  General appearance: alert and no distress Neck: no adenopathy, no carotid bruit, no JVD, supple, symmetrical, trachea midline, and thyroid not enlarged, symmetric, no tenderness/mass/nodules Lungs: clear to auscultation bilaterally Heart: regular rate and rhythm, S1, S2 normal, no murmur, click, rub or gallop Extremities: extremities normal, atraumatic, no cyanosis or edema Pulses: 2+ and symmetric Skin: Skin color, texture, turgor normal. No rashes or lesions Neurologic: Grossly normal  EKG not performed today  ASSESSMENT AND PLAN:   Peripheral arterial disease [facility]) Mr. Lacomb returns today for follow-up of his PAD.  He has lifestyle-limiting claudication right greater than left.  Recent Doppler studies performed 11/23/2021 revealed a right ABI of 0.83 and a left of 1.05.  He had a high-frequency signal in his right  extrailiac artery and a moderately elevated signal in his left common iliac artery.  He wishes to proceed with outpatient diagnostic peripheral angiography and potential endovascular therapy for claudication.     Lorretta Harp MD FACP,FACC,FAHA, [facility] 11/29/2021 3:13 PM

## 2021-11-29 NOTE — Patient Instructions (Addendum)
Medication Instructions:  Your physician recommends that you continue on your current medications as directed. Please refer to the Current Medication list given to you today.  *If you need a refill on your cardiac medications before your next appointment, please call your pharmacy*   Lab Work: Your physician recommends that you have labs drawn today: BMET & CBC  If you have labs (blood work) drawn today and your tests are completely normal, you will receive your results only by: [place] (if you have MyChart) OR A paper copy in the mail If you have any lab test that is abnormal or we need to change your treatment, we will call you to review the results.   Testing/Procedures: Your physician has requested that you have an Aorta/Iliac Duplex. This will be take place at [place], [address].  No food after 11PM the night before.  Water is OK. (Don't drink liquids if you have been instructed not to for ANOTHER test) Avoid foods that produce bowel gas, for 24 hours prior to exam (see below). No breakfast, no chewing gum, no smoking or carbonated beverages. Patient may take morning medications with water. Come in for test at least 15 minutes early to register. To be done 1-2 weeks after PV procedure (12/4)  Your physician has requested that you have a lower extremity arterial duplex. During this test, ultrasound is used to evaluate arterial blood flow in the legs. Allow one hour for this exam. There are no restrictions or special instructions. This will take place at [place], [address]. To be done 1-2 weeks after PV procedure (12/4)  Your physician has requested that you have an ankle brachial index (ABI). During this test an ultrasound and blood pressure cuff are used to evaluate the arteries that supply the arms and legs with blood. Allow thirty minutes for this exam. There are no restrictions or special instructions. This will take place at [place], [address].  To be done 1-2 weeks after PV procedure (12/4)     Follow-Up: At [facility], you and your health needs are our priority.  As part of our continuing mission to provide you with exceptional heart care, we have created designated Provider Care Teams.  These Care Teams include your primary Cardiologist (physician) and Advanced Practice Providers (APPs -  Physician Assistants and Nurse Practitioners) who all work together to provide you with the care you need, when you need it.  We recommend signing up for the patient portal called "MyChart".  Sign up information is provided on this After Visit Summary.  MyChart is used to connect with patients for Virtual Visits (Telemedicine).  Patients are able to view lab/test results, encounter notes, upcoming appointments, etc.  Non-urgent messages can be sent to your provider as well.   To learn more about what you can do with MyChart, go to [email or web address].    Your next appointment:   2-3 week(s) after PV procedure   The format for your next appointment:   In Person  Provider:   Quay Burow, MD    Other Instructions       Cardiac/Peripheral Catheterization   You are scheduled for a Peripheral Angiogram on Monday, December 4 with Dr. Quay Burow.  1. Please arrive at the Main Entrance A at [facility]: [place], [place] [address] on December 4 at 12:00 PM (This time is two hours before your procedure to ensure your preparation). Free valet parking service  is available. You will check in at ADMITTING. The support person will be asked to wait in the waiting room.  It is OK to have someone drop you off and come back when you are ready to be discharged.        Special note: Every effort is made to have your procedure done on time. Please understand that emergencies sometimes delay scheduled procedures.   . 2. Diet: Do not eat solid foods after midnight.  You may have clear liquids until 5 AM the  day of the procedure.  3. Labs: You will need to have blood drawn today (11/16)  4. Medication instructions in preparation for your procedure:  On the morning of your procedure, take Aspirin 81 mg and any morning medicines NOT listed above.  You may use sips of water.  5. Pack an over night bag your provider may want to keep you for observation. 6. You MUST have a responsible adult to drive you home. 7. An adult MUST be with you the first 24 hours after you arrive home. 8. Bring a current list of your medications, and the last time and date medication taken. 9. Bring ID and current insurance cards. 10.Please wear clothes that are easy to get on and off and wear slip-on shoes.  Thank you for allowing [place] to care for you!   -- [facility]

## 2021-11-30 ENCOUNTER — Other Ambulatory Visit: Payer: Self-pay

## 2021-11-30 DIAGNOSIS — I739 Peripheral vascular disease, unspecified: Secondary | ICD-10-CM

## 2021-11-30 LAB — BASIC METABOLIC PANEL
BUN/Creatinine Ratio: 21 (ref 10–24)
BUN: 23 mg/dL (ref 8–27)
CO2: 25 mmol/L (ref 20–29)
Calcium: 9.7 mg/dL (ref 8.6–10.2)
Chloride: 102 mmol/L (ref 96–106)
Creatinine, Ser: 1.1 mg/dL (ref 0.76–1.27)
Glucose: 94 mg/dL (ref 70–99)
Potassium: 5.3 mmol/L — ABNORMAL HIGH (ref 3.5–5.2)
Sodium: 141 mmol/L (ref 134–144)
eGFR: 73 mL/min/{1.73_m2} (ref >59)

## 2021-11-30 LAB — CBC
Hematocrit: 39.3 % (ref 37.5–51.0)
Hemoglobin: 12.8 g/dL — ABNORMAL LOW (ref 13.0–17.7)
MCH: 29.8 pg (ref 26.6–33.0)
MCHC: 32.6 g/dL (ref 31.5–35.7)
MCV: 91 fL (ref 79–97)
Platelets: 272 10*3/uL (ref 150–450)
RBC: 4.3 x10E6/uL (ref 4.14–5.80)
RDW: 11.9 % (ref 11.6–15.4)
WBC: 13.3 10*3/uL — ABNORMAL HIGH (ref 3.4–10.8)

## 2021-11-30 MED ORDER — SODIUM CHLORIDE 0.9% FLUSH: 3.0000 mL | Freq: Two times a day (BID) | INTRAVENOUS | Status: DC

## 2021-12-11 ENCOUNTER — Ambulatory Visit (INDEPENDENT_AMBULATORY_CARE_PROVIDER_SITE_OTHER): Payer: Federal, State, Local not specified - PPO

## 2021-12-11 DIAGNOSIS — I255 Ischemic cardiomyopathy: Secondary | ICD-10-CM | POA: Diagnosis not present

## 2021-12-11 LAB — CUP PACEART REMOTE DEVICE CHECK
Date Time Interrogation Session: 20231128080409
Implantable Lead Connection Status: 753985
Implantable Lead Implant Date: 20141124
Implantable Lead Location: 753860
Implantable Lead Model: 365
Implantable Lead Serial Number: 10552343
Implantable Pulse Generator Implant Date: 20141124
Pulse Gen Model: 383594
Pulse Gen Serial Number: 60740583

## 2021-12-13 ENCOUNTER — Telehealth: Payer: Self-pay | Admitting: *Deleted

## 2021-12-13 NOTE — Telephone Encounter (Signed)
Patient reports that he lives alone and does not have any family or friends to stay with him if same day discharge. Patient reports his circumstances would be the same regardless of day or time of procedure. Patient also plans to use [facility] for transportation to and from hospital.  Patient advised he will not be able to be discharged the same day if he does not have responsible adult with him when he is discharged and first 24 hours home.   I will forward to Dr Gwenlyn Found for his review.

## 2021-12-13 NOTE — Telephone Encounter (Addendum)
Abdominal aortogram  scheduled at [facility] for: Monday December 17, 2021 2 PM Arrival time and place: [place] Entrance A at: 20 Noon  Nothing to eat after midnight prior to procedure, clear liquids until 5 AM day of procedure.  Medication instructions: -Usual morning medications can be taken with sips of water including aspirin 81 mg.  Confirmed patient has responsible adult to drive home post procedure and be with patient first 24 hours after arriving home-* see note.  Patient reports no new symptoms concerning for COVID-19 in the past 10 days.  Reviewed procedure instructions with patient.

## 2021-12-13 NOTE — Telephone Encounter (Signed)
Per Dr Jerrol Banana aware plan will be to keep him overnight.

## 2021-12-17 ENCOUNTER — Ambulatory Visit (HOSPITAL_COMMUNITY)
Admission: RE | Admit: 2021-12-17 | Discharge: 2021-12-18 | Disposition: A | Discharge status: Home / Self Care | Payer: Federal, State, Local not specified - PPO | Attending: Cardiovascular Disease | Admitting: Cardiovascular Disease

## 2021-12-17 ENCOUNTER — Other Ambulatory Visit: Payer: Self-pay

## 2021-12-17 ENCOUNTER — Encounter (HOSPITAL_COMMUNITY)
Admission: RE | Disposition: A | Discharge status: Home / Self Care | Payer: Self-pay | Source: Home / Self Care | Attending: Cardiovascular Disease

## 2021-12-17 DIAGNOSIS — I255 Ischemic cardiomyopathy: Secondary | ICD-10-CM | POA: Diagnosis not present

## 2021-12-17 DIAGNOSIS — F17290 Nicotine dependence, other tobacco product, uncomplicated: Secondary | ICD-10-CM | POA: Insufficient documentation

## 2021-12-17 DIAGNOSIS — Z951 Presence of aortocoronary bypass graft: Secondary | ICD-10-CM | POA: Diagnosis not present

## 2021-12-17 DIAGNOSIS — Z955 Presence of coronary angioplasty implant and graft: Secondary | ICD-10-CM | POA: Diagnosis not present

## 2021-12-17 DIAGNOSIS — I708 Atherosclerosis of other arteries: Secondary | ICD-10-CM | POA: Insufficient documentation

## 2021-12-17 DIAGNOSIS — E785 Hyperlipidemia, unspecified: Secondary | ICD-10-CM | POA: Insufficient documentation

## 2021-12-17 DIAGNOSIS — I10 Essential (primary) hypertension: Secondary | ICD-10-CM | POA: Diagnosis not present

## 2021-12-17 DIAGNOSIS — I2510 Atherosclerotic heart disease of native coronary artery without angina pectoris: Secondary | ICD-10-CM | POA: Insufficient documentation

## 2021-12-17 DIAGNOSIS — Z95810 Presence of automatic (implantable) cardiac defibrillator: Secondary | ICD-10-CM | POA: Diagnosis present

## 2021-12-17 DIAGNOSIS — I70211 Atherosclerosis of native arteries of extremities with intermittent claudication, right leg: Secondary | ICD-10-CM | POA: Insufficient documentation

## 2021-12-17 DIAGNOSIS — I739 Peripheral vascular disease, unspecified: Secondary | ICD-10-CM | POA: Diagnosis present

## 2021-12-17 HISTORY — PX: PERIPHERAL VASCULAR INTERVENTION: CATH118257

## 2021-12-17 HISTORY — PX: ABDOMINAL AORTOGRAM W/LOWER EXTREMITY: CATH118223

## 2021-12-17 LAB — POCT ACTIVATED CLOTTING TIME
Activated Clotting Time: 293 seconds
Activated Clotting Time: 223 seconds
Activated Clotting Time: 201 seconds

## 2021-12-17 LAB — GLUCOSE, CAPILLARY: Glucose-Capillary: 90 mg/dL (ref 70–99)

## 2021-12-17 SURGERY — ABDOMINAL AORTOGRAM W/LOWER EXTREMITY
Anesthesia: LOCAL | Laterality: Right

## 2021-12-17 MED ORDER — SODIUM CHLORIDE 0.9 % IV SOLN: INTRAVENOUS | Status: AC

## 2021-12-17 MED ORDER — LABETALOL HCL 5 MG/ML IV SOLN: 10.0000 mg | INTRAVENOUS | Status: DC | PRN

## 2021-12-17 MED ORDER — LIDOCAINE HCL (PF) 1 % IJ SOLN
INTRAMUSCULAR | Status: DC | PRN
  Administered 2021-12-17: 15 mL via INTRADERMAL

## 2021-12-17 MED ORDER — SODIUM CHLORIDE 0.9 % IV SOLN: 250.0000 mL | INTRAVENOUS | Status: DC | PRN

## 2021-12-17 MED ORDER — FENTANYL CITRATE (PF) 100 MCG/2ML IJ SOLN
INTRAMUSCULAR | Status: DC | PRN
  Administered 2021-12-17: 25 ug via INTRAVENOUS

## 2021-12-17 MED ORDER — HEPARIN (PORCINE) IN NACL 1000-0.9 UT/500ML-% IV SOLN
INTRAVENOUS | Status: AC
  Filled 2021-12-17: qty 1000

## 2021-12-17 MED ORDER — FENTANYL CITRATE (PF) 100 MCG/2ML IJ SOLN
INTRAMUSCULAR | Status: AC
  Filled 2021-12-17: qty 2

## 2021-12-17 MED ORDER — SODIUM CHLORIDE 0.9% FLUSH: 3.0000 mL | INTRAVENOUS | Status: DC | PRN

## 2021-12-17 MED ORDER — HEPARIN (PORCINE) IN NACL 1000-0.9 UT/500ML-% IV SOLN
INTRAVENOUS | Status: DC | PRN
  Administered 2021-12-17 (×2): 500 mL

## 2021-12-17 MED ORDER — LISINOPRIL 5 MG PO TABS
5.0000 mg | ORAL_TABLET | Freq: Every day | ORAL | Status: DC
  Administered 2021-12-17 – 2021-12-18 (×2): 5 mg via ORAL
  Filled 2021-12-17 (×2): qty 1

## 2021-12-17 MED ORDER — HEPARIN SODIUM (PORCINE) 1000 UNIT/ML IJ SOLN
INTRAMUSCULAR | Status: DC | PRN
  Administered 2021-12-17: 8000 [IU] via INTRAVENOUS

## 2021-12-17 MED ORDER — METOPROLOL SUCCINATE ER 50 MG PO TB24
50.0000 mg | ORAL_TABLET | Freq: Every day | ORAL | Status: DC
  Administered 2021-12-17 – 2021-12-18 (×2): 50 mg via ORAL
  Filled 2021-12-17 (×2): qty 1

## 2021-12-17 MED ORDER — MORPHINE SULFATE (PF) 2 MG/ML IV SOLN: 2.0000 mg | INTRAVENOUS | Status: DC | PRN

## 2021-12-17 MED ORDER — HEPARIN SODIUM (PORCINE) 1000 UNIT/ML IJ SOLN
INTRAMUSCULAR | Status: AC
  Filled 2021-12-17: qty 10

## 2021-12-17 MED ORDER — ASPIRIN 81 MG PO TBEC
81.0000 mg | DELAYED_RELEASE_TABLET | Freq: Every day | ORAL | Status: DC
  Administered 2021-12-18: 81 mg via ORAL
  Filled 2021-12-17: qty 1

## 2021-12-17 MED ORDER — CLOPIDOGREL BISULFATE 300 MG PO TABS
ORAL_TABLET | ORAL | Status: DC | PRN
  Administered 2021-12-17: 300 mg via ORAL

## 2021-12-17 MED ORDER — ASPIRIN 81 MG PO CHEW: 81.0000 mg | CHEWABLE_TABLET | ORAL | Status: DC

## 2021-12-17 MED ORDER — ONDANSETRON HCL 4 MG/2ML IJ SOLN: 4.0000 mg | Freq: Four times a day (QID) | INTRAMUSCULAR | Status: DC | PRN

## 2021-12-17 MED ORDER — ATORVASTATIN CALCIUM 80 MG PO TABS
80.0000 mg | ORAL_TABLET | Freq: Every day | ORAL | Status: DC
  Filled 2021-12-17: qty 1

## 2021-12-17 MED ORDER — SODIUM CHLORIDE 0.9 % WEIGHT BASED INFUSION
3.0000 mL/kg/h | INTRAVENOUS | Status: DC
  Administered 2021-12-17: 3 mL/kg/h via INTRAVENOUS

## 2021-12-17 MED ORDER — ATORVASTATIN CALCIUM 80 MG PO TABS
80.0000 mg | ORAL_TABLET | Freq: Every day | ORAL | Status: DC
  Administered 2021-12-17 – 2021-12-18 (×2): 80 mg via ORAL
  Filled 2021-12-17 (×2): qty 1

## 2021-12-17 MED ORDER — SODIUM CHLORIDE 0.9 % WEIGHT BASED INFUSION: 1.0000 mL/kg/h | INTRAVENOUS | Status: DC

## 2021-12-17 MED ORDER — ACETAMINOPHEN 325 MG PO TABS
650.0000 mg | ORAL_TABLET | ORAL | Status: DC | PRN
  Administered 2021-12-17: 650 mg via ORAL
  Filled 2021-12-17: qty 2

## 2021-12-17 MED ORDER — CLOPIDOGREL BISULFATE 75 MG PO TABS
75.0000 mg | ORAL_TABLET | Freq: Every day | ORAL | Status: DC
  Administered 2021-12-18: 75 mg via ORAL
  Filled 2021-12-17: qty 1

## 2021-12-17 MED ORDER — OXYCODONE HCL 5 MG PO TABS
5.0000 mg | ORAL_TABLET | Freq: Four times a day (QID) | ORAL | Status: DC | PRN
  Administered 2021-12-17: 5 mg via ORAL
  Filled 2021-12-17: qty 1

## 2021-12-17 MED ORDER — IODIXANOL 320 MG/ML IV SOLN
INTRAVENOUS | Status: DC | PRN
  Administered 2021-12-17: 124 mL via INTRA_ARTERIAL

## 2021-12-17 MED ORDER — SODIUM CHLORIDE 0.9% FLUSH
3.0000 mL | Freq: Two times a day (BID) | INTRAVENOUS | Status: DC
  Administered 2021-12-18: 3 mL via INTRAVENOUS

## 2021-12-17 MED ORDER — HYDRALAZINE HCL 20 MG/ML IJ SOLN: 5.0000 mg | INTRAMUSCULAR | Status: DC | PRN

## 2021-12-17 MED ORDER — MIDAZOLAM HCL 2 MG/2ML IJ SOLN
INTRAMUSCULAR | Status: DC | PRN
  Administered 2021-12-17: 1 mg via INTRAVENOUS

## 2021-12-17 MED ORDER — LIDOCAINE HCL (PF) 1 % IJ SOLN
INTRAMUSCULAR | Status: AC
  Filled 2021-12-17: qty 30

## 2021-12-17 MED ORDER — MIDAZOLAM HCL 2 MG/2ML IJ SOLN
INTRAMUSCULAR | Status: AC
  Filled 2021-12-17: qty 2

## 2021-12-17 MED ORDER — CLOPIDOGREL BISULFATE 300 MG PO TABS
ORAL_TABLET | ORAL | Status: AC
  Filled 2021-12-17: qty 1

## 2021-12-17 MED ORDER — ASPIRIN 81 MG PO TBEC: 81.0000 mg | DELAYED_RELEASE_TABLET | Freq: Every day | ORAL | Status: DC

## 2021-12-17 SURGICAL SUPPLY — 16 items
BALLN MUSTANG 5.0X20 75 (BALLOONS) ×2
BALLN MUSTANG 7X20X135 (BALLOONS) ×2
BALLOON MUSTANG 5.0X20 75 (BALLOONS) IMPLANT
BALLOON MUSTANG 7X20X135 (BALLOONS) IMPLANT
CATH ANGIO 5F PIGTAIL 65CM (CATHETERS) IMPLANT
KIT ENCORE 26 ADVANTAGE (KITS) IMPLANT
KIT PV (KITS) ×2 IMPLANT
SHEATH BRITE TIP 6FR 35CM (SHEATH) IMPLANT
SHEATH PINNACLE 5F 10CM (SHEATH) IMPLANT
SHEATH PINNACLE 6F 10CM (SHEATH) IMPLANT
STENT ABSOLUTE PRO 8X40X135 (Permanent Stent) IMPLANT
SYR MEDRAD MARK 7 150ML (SYRINGE) ×2 IMPLANT
TRANSDUCER W/STOPCOCK (MISCELLANEOUS) ×2 IMPLANT
TRAY PV CATH (CUSTOM PROCEDURE TRAY) ×2 IMPLANT
WIRE HITORQ VERSACORE ST 145CM (WIRE) IMPLANT
WIRE VERSACORE LOC 115CM (WIRE) IMPLANT

## 2021-12-17 NOTE — Progress Notes (Signed)
Site area: Right groin a 6 french arterial sheath was removed  Site Prior to Removal:  Level 0  Pressure Applied For 20 MINUTES    Bedrest Beginning at 1845pm X 4 hours  Manual:   Yes.    Patient Status During Pull:  stable  Post Pull Groin Site:  Level 0  Post Pull Instructions Given:  Yes.    Post Pull Pulses Present:  Yes.    Dressing Applied:  Yes.    Comments:

## 2021-12-17 NOTE — Interval H&P Note (Signed)
History and Physical Interval Note:  12/17/2021 2:09 PM  Hector Byrd  has presented today for surgery, with the diagnosis of pad.  The various methods of treatment have been discussed with the patient and family. After consideration of risks, benefits and other options for treatment, the patient has consented to  Procedure(s): ABDOMINAL AORTOGRAM W/LOWER EXTREMITY (N/A) as a surgical intervention.  The patient's history has been reviewed, patient examined, no change in status, stable for surgery.  I have reviewed the patient's chart and labs.  Questions were answered to the patient's satisfaction.     Quay Burow

## 2021-12-18 ENCOUNTER — Other Ambulatory Visit (HOSPITAL_COMMUNITY): Payer: Self-pay

## 2021-12-18 ENCOUNTER — Encounter (HOSPITAL_COMMUNITY): Payer: Self-pay | Admitting: Cardiovascular Disease

## 2021-12-18 DIAGNOSIS — I739 Peripheral vascular disease, unspecified: Secondary | ICD-10-CM

## 2021-12-18 DIAGNOSIS — I70211 Atherosclerosis of native arteries of extremities with intermittent claudication, right leg: Secondary | ICD-10-CM | POA: Diagnosis not present

## 2021-12-18 LAB — CBC
HCT: 35.7 % — ABNORMAL LOW (ref 39.0–52.0)
Hemoglobin: 12.3 g/dL — ABNORMAL LOW (ref 13.0–17.0)
MCH: 31.6 pg (ref 26.0–34.0)
MCHC: 34.5 g/dL (ref 30.0–36.0)
MCV: 91.8 fL (ref 80.0–100.0)
Platelets: 246 10*3/uL (ref 150–400)
RBC: 3.89 MIL/uL — ABNORMAL LOW (ref 4.22–5.81)
RDW: 13.3 % (ref 11.5–15.5)
WBC: 9 10*3/uL (ref 4.0–10.5)
nRBC: 0 % (ref 0.0–0.2)

## 2021-12-18 LAB — LIPID PANEL
Cholesterol: 81 mg/dL (ref 0–200)
HDL: 32 mg/dL — ABNORMAL LOW (ref >40)
LDL Cholesterol: 34 mg/dL (ref 0–99)
Total CHOL/HDL Ratio: 2.5 RATIO
Triglycerides: 74 mg/dL (ref <150)
VLDL: 15 mg/dL (ref 0–40)

## 2021-12-18 LAB — BASIC METABOLIC PANEL
Anion gap: 11 (ref 5–15)
BUN: 13 mg/dL (ref 8–23)
CO2: 24 mmol/L (ref 22–32)
Calcium: 9.4 mg/dL (ref 8.9–10.3)
Chloride: 106 mmol/L (ref 98–111)
Creatinine, Ser: 1.02 mg/dL (ref 0.61–1.24)
GFR, Estimated: >60 mL/min (ref >60)
Glucose, Bld: 81 mg/dL (ref 70–99)
Potassium: 3.8 mmol/L (ref 3.5–5.1)
Sodium: 141 mmol/L (ref 135–145)

## 2021-12-18 MED ORDER — PANTOPRAZOLE SODIUM 40 MG PO TBEC
40.0000 mg | DELAYED_RELEASE_TABLET | Freq: Every day | ORAL | 5 refills | Status: DC
  Filled 2021-12-18: qty 30, 30d supply, fill #0

## 2021-12-18 MED ORDER — CLOPIDOGREL BISULFATE 75 MG PO TABS
75.0000 mg | ORAL_TABLET | Freq: Every day | ORAL | 5 refills | Status: DC
  Filled 2021-12-18: qty 30, 30d supply, fill #0

## 2021-12-18 NOTE — Discharge Summary (Signed)
Discharge Summary    Patient ID: Brenin Heidelberger MRN: [id number]; DOB: Jun 04, 1955  Admit date: 12/17/2021 Discharge date: 12/18/2021  PCP:  [name], NP-C   [place] [facility] Cardiologist:  Fransico Him, MD  Electrophysiologist:  Cristopher Peru, MD       Discharge Diagnoses    Principal Problem:   Claudication in peripheral vascular disease [facility]) Active Problems:   CAD (coronary artery disease)   Dyslipidemia   Cardiomyopathy, ischemic   HTN (hypertension)   ICD (implantable cardioverter-defibrillator) in place    Diagnostic Studies/Procedures    LE angiography 12/17/2021   Pre Procedure Diagnosis: Peripheral arterial disease   Post Procedure Diagnosis: Peripheral arterial disease   Operators: Dr. Quay Burow   Procedures Performed:             1.  Ultrasound-guided right common femoral access             2.  Abdominal aortogram/bilateral iliac angiogram             3.  PTA and stent right extrailiac artery   Angiographic Data:    1: Abdominal aorta-renal arteries widely patent.  The infrarenal abdominal aorta was tortuous and moderately atherosclerotic. 2: Left lower extremity-60% proximal eccentric left common iliac artery stenosis 3: Right lower extremity-40% proximal right common iliac artery stenosis and 80% right external iliac artery stenosis   IMPRESSION: Mr.Soderquist has a high-grade right extrailiac artery stenosis contributing to right lower extremity lifestyle-limiting claudication.  We will proceed with PTA and stenting.   Procedure Description: The 5 French sheath was exchanged over an 035 versa core wire for a 6 [place] Brite tip sheath.  The patient received a total of 8000's of heparin with an ACT of 293.  I performed predilatation with a 5 mm x 2 cm Maverick balloon.  I then stented the diseased segment with a 8 mm x 4 cm Abbott nitinol absolute Pro self-expanding stent and postdilated with a 7 mm x 2 cm balloon resulting reduction of  a 80% right extrailiac artery stenosis to 0% residual.  Patient tolerated the procedure well.  I exchanged the Brite tip sheath over the wire for a short 6 French sheath which was then secured in place.  The patient received 300 mg of p.o. clopidogrel prior to leaving the lab.   Final Impression: Successful right external iliac artery PTA and stenting using a 8 mm x 40 mm long Abbott nitinol absolute Pro self-expanding stent for lifestyle limiting claudication.  Patient tolerated procedure well.  The sheath will be removed once ACT falls below less than 170 pressure will be held.  He will be discharged home in the morning on aspirin and clopidogrel.  We will arrange lower extremity arterial Doppler studies in unrefined office next week and I will see him back the week after.               _____________   History of Present Illness     Kdyn Vonbehren is a 66 y.o. male  seen appearing single Caucasian male with no children referred by Mack Hook NP for peripheral vascular valuation because of claudication.  His cardiologist is Dr. Fransico Him.  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  He has treated hypertension and hyperlipidemia.  He had CABG back in 1996  after several stent procedures and has been stented since that time.  He has moderately severe LV dysfunction status post ICD implantation followed by Dr. Lovena Le.  He complains of bilateral lower extremity claudication right worse than left which is lifestyle limiting.  Doppler studies performed 12/21/2019 revealed a right ABI of 0.61 and a left of 0.79.  He had a high-frequency signal in his right external iliac artery and left common iliac artery.    Since I saw him a year and a half ago he had progressive lifestyle-limiting claudication.  Recent Doppler studies performed 11/23/2021  revealed a right ABI of 0.83 and a left of 1.05.  He did have a high-frequency signal in his right extrailiac artery and a mildly elevated signal in his left common femoral artery.  He wishes to proceed with outpatient diagnostic peripheral angiography and potential endovascular therapy.  Hospital Course     Consultants: N/A   Patient presented to [facility] on 12/17/2021 for the planned lower extremity angiography.  The procedure revealed widely patent renal arteries, 60% prox L common iliac, 40% prox R common iliac, 80% R external iliac artery. Patient underwent R external iliac artery PTA and stenting with 25m x 44mAbbott nitinol absolute Pro self-expanding stent.  He was kept overnight for observation.  Patient was seen in the morning of 12/18/2021 at which time his right femoral cath site shows no sign of bleeding and is nontender.  He ambulated without any issue.  He has home Prilosec has been switched to Protonix given the need for Plavix therapy.  He will be discharged on dual antiplatelet therapy including aspirin and Plavix.  Both Plavix prescription and Protonix [facility] Doppler has been arranged for December 18 and follow-up scheduled with Dr. BeGwenlyn Foundn December 27.      Did the patient have an acute coronary syndrome (MI, NSTEMI, STEMI, etc) this admission?:  No                               Did the patient have a percutaneous coronary intervention (stent / angioplasty)?:  No.          _____________  Discharge Vitals Blood pressure (!) 145/72, pulse 83, temperature 98.1 F (36.7 C), temperature source Oral, resp. rate 18, height '5\' 7"'$  (1.702 m), weight 56.2 kg, SpO2 99 %.  Filed Weights   12/17/21 1349  Weight: 56.2 kg    Labs & Radiologic Studies    CBC Recent Labs    12/18/21 0306  WBC 9.0  HGB 12.3*  HCT 35.7*  MCV 91.8  PLT 24814 Basic Metabolic Panel Recent Labs    12/18/21 0306  NA 141  K  3.8  CL 106  CO2 24  GLUCOSE 81  BUN 13  CREATININE 1.02  CALCIUM 9.4   Liver Function Tests No results for input(s): "AST", "ALT", "ALKPHOS", "BILITOT", "PROT", "ALBUMIN" in the last 72 hours. No results for input(s): "LIPASE", "AMYLASE" in the last 72 hours. High Sensitivity Troponin:   No results for input(s): "TROPONINIHS" in the last 720 hours.  BNP Invalid input(s): "POCBNP" D-Dimer No results for input(s): "DDIMER" in the last 72 hours. Hemoglobin A1C No results for input(s): "HGBA1C" in the last 72 hours. Fasting Lipid Panel Recent Labs    12/18/21 0306  CHOL 81  HDL 32*  LDLCALC 34  TRIG 74  CHOLHDL 2.5  Thyroid Function Tests No results for input(s): "TSH", "T4TOTAL", "T3FREE", "THYROIDAB" in the last 72 hours.  Invalid input(s): "FREET3" _____________  PERIPHERAL VASCULAR CATHETERIZATION  Result Date: 12/17/2021 Images from the original result were not included.  [id number] LOCATION:  FACILITY: [facility] PHYSICIAN: Quay Burow, M.D. 19-Jul-1955 DATE OF PROCEDURE:  12/17/2021 DATE OF DISCHARGE: PV Angiogram/Intervention History obtained from chart review.Blair Lundeen is a 66 y.o.  seen appearing single Caucasian male with no children referred by Mack Hook NP for peripheral vascular valuation because of claudication.  His cardiologist is Dr. Fransico Him.  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  He has treated hypertension and hyperlipidemia.  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  He has moderately severe LV dysfunction status post ICD implantation followed by Dr. Lovena Le.  He complains of bilateral lower extremity claudication right worse than left which is lifestyle limiting.  Doppler studies performed 12/21/2019 revealed a right ABI of 0.61 and a  left of 0.79.  He had a high-frequency signal in his right external iliac artery and left common iliac artery.  Since I saw him a year and a half ago he had progressive lifestyle-limiting claudication.  Recent Doppler studies performed 11/23/2021 revealed a right ABI of 0.83 and a left of 1.05.  He did have a high-frequency signal in his right extrailiac artery and a mildly elevated signal in his left common femoral artery.  He wishes to proceed with outpatient diagnostic peripheral angiography and potential endovascular therapy. Pre Procedure Diagnosis: Peripheral arterial disease Post Procedure Diagnosis: Peripheral arterial disease Operators: Dr. Quay Burow Procedures Performed:  1.  Ultrasound-guided right common femoral access  2.  Abdominal aortogram/bilateral iliac angiogram  3.  PTA and stent right extrailiac artery  PROCEDURE DESCRIPTION: The patient was brought to the second floor  Cardiac cath lab in the the postabsorptive state. He was premedicated with IV Versed and fentanyl. His right groin was prepped and shaved in usual sterile fashion. Xylocaine 1% was used for local anesthesia. A 5 French sheath was inserted into the right common femoral artery using standard Seldinger technique.  Ultrasound was used to identify the right common femoral artery and guide access.  A digital image of this was captured and placed the patient's chart.  A 5 French pigtail catheter was placed in distal abdominal aorta.  Distal abdominal aortography, bilateral iliac angiography was performed.  Omnipaque dye was used for the entirety of the case (124 cc total contrast the patient).  Retrograde pressures monitored in the case.  Angiographic Data: 1: Abdominal aorta-renal arteries widely patent.  The infrarenal abdominal aorta was tortuous and moderately atherosclerotic. 2: Left lower extremity-60% proximal eccentric left common iliac artery stenosis 3: Right lower extremity-40% proximal right common iliac  artery stenosis and 80% right external iliac artery stenosis   Mr.Spicer has a high-grade right extrailiac artery stenosis contributing to right lower extremity lifestyle-limiting claudication.  We will proceed with PTA and stenting. Procedure Description: The 5 French sheath was exchanged over an 035 versa core wire for a 6 [place] Brite tip sheath.  The patient received a total of 8000's of heparin with an ACT of 293.  I performed predilatation with a 5 mm x 2 cm Maverick balloon.  I then stented the diseased  segment with a 8 mm x 4 cm Abbott nitinol absolute Pro self-expanding stent and postdilated with a 7 mm x 2 cm balloon resulting reduction of a 80% right extrailiac artery stenosis to 0% residual.  Patient tolerated the procedure well.  I exchanged the Brite tip sheath over the wire for a short 6 French sheath which was then secured in place.  The patient received 300 mg of p.o. clopidogrel prior to leaving the lab. Final Impression: Successful right external iliac artery PTA and stenting using a 8 mm x 40 mm long Abbott nitinol absolute Pro self-expanding stent for lifestyle limiting claudication.  Patient tolerated procedure well.  The sheath will be removed once ACT falls below less than 170 pressure will be held.  He will be discharged home in the morning on aspirin and clopidogrel.  We will arrange lower extremity arterial Doppler studies in unrefined office next week and I will see him back the week after. Quay Burow. MD, [facility] 12/17/2021 3:23 PM    CUP PACEART REMOTE DEVICE CHECK  Result Date: 12/11/2021 Scheduled remote reviewed. Normal device function.  Estimated longevity 15%, nearing RRT. Will initiate IFU with monthly transmissions. 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 Patient reports lower extremity weakness after              walking 1.5 blocks, right > left. Risk Factors: Hypertension, current smoker, coronary artery disease. Other Factors: ABI performed 10/16/21 were 0.83 on the right and 1.05 on the                left. Limitations: Air/bowel gas.  Comparison Study: Previous arterial duplex performed 12/21/19 showed distal aorta                   dimensions of 3.0 x 2.9 cm, severe >50% right external iliac                   stenosis and >50% left common iliac stenosis. Performing Technologist: Mariane Masters RVT  Examination Guidelines: A complete evaluation includes B-mode imaging, spectral Doppler, color Doppler, and power Doppler as needed of all accessible portions of each vessel. Bilateral testing is considered an integral part of a complete examination. Limited examinations for reoccurring indications may be performed as noted.  Abdominal Aorta Findings: +-------------+-------+----------+---------+----------+--------+---------------+ Location     AP (cm)Trans (cm)PSV      Waveform  ThrombusComments                                      (cm/s)                                     +-------------+-------+----------+---------+----------+--------+---------------+ Proximal     2.80   2.70      60  biphasic                          +-------------+-------+----------+---------+----------+--------+---------------+ Mid          2.60   2.60      31       biphasic          ectatic         +-------------+-------+----------+---------+----------+--------+---------------+ Distal       2.90   3.00      19       biphasic          fusiform         +-------------+-------+----------+---------+----------+--------+---------------+ RT CIA Prox  1.7    2.0       41       biphasic                          +-------------+-------+----------+---------+----------+--------+---------------+ RT CIA Mid                    77       monophasic                        +-------------+-------+----------+---------+----------+--------+---------------+ RT CIA Distal                 98       monophasic                        +-------------+-------+----------+---------+----------+--------+---------------+ RT EIA Prox  1.1    1.0       153      monophasic                        +-------------+-------+----------+---------+----------+--------+---------------+ RT EIA Mid                    558      stenotic          severe >50%                                                              stenosis        +-------------+-------+----------+---------+----------+--------+---------------+ RT EIA Distal                 281                        turbulent       +-------------+-------+----------+---------+----------+--------+---------------+ LT CIA Prox  1.2    1.3       341      stenotic          >50% stenosis   +-------------+-------+----------+---------+----------+--------+---------------+ LT CIA Mid                    194      biphasic                          +-------------+-------+----------+---------+----------+--------+---------------+ LT CIA Distal                 187      biphasic                          +-------------+-------+----------+---------+----------+--------+---------------+  LT EIA Prox  1.0    0.9       131      biphasic                          +-------------+-------+----------+---------+----------+--------+---------------+ LT EIA Mid                    155      biphasic                          +-------------+-------+----------+---------+----------+--------+---------------+ LT  EIA Distal                 167      biphasic                          +-------------+-------+----------+---------+----------+--------+---------------+ Visualization of the Superceliac artery, Proximal Abdominal Aorta, Left EIA Proximal artery and Right EIA Proximal artery was limited. IVC/Iliac Findings: +--------+------+--------+--------+   IVC   PatentThrombusComments +--------+------+--------+--------+ IVC Proxpatent                 +--------+------+--------+--------+    Summary: Abdominal Aorta: There is evidence of abnormal dilatation of the distal Abdominal aorta. There is evidence of abnormal dilation of the Right Common Iliac artery. The largest aortic measurement is 3.0 cm. The largest aortic diameter remains essentially unchanged compared to prior exam. Previous diameter measurement was 3.0 cm obtained on 12/21/19. Stenosis: +--------------------+-------------+------------------+ Location            Stenosis     Comments           +--------------------+-------------+------------------+ Left Common Iliac   >50% stenosisessentially stable +--------------------+-------------+------------------+ Right External Iliac>50% stenosisessentially stable +--------------------+-------------+------------------+  IVC/Iliac: There is no evidence of thrombus involving the IVC.  *See table(s) above for measurements and observations. [facility] PV consult if patient is symptomatic. Suggest follow up study in 24 months.  Electronically signed by Kathlyn Sacramento MD on 11/26/2021 at 7:46:03 AM.    Final    Disposition   Pt is being discharged home today in good condition.  Follow-up Plans & Appointments     Follow-up [facility] Follow up on 12/31/2021.   Specialty: Cardiology Why: '@9'$ :00AM. lower extremity doppler to make sure stent is patent. Contact information: [place] [id number] [address] [place] [address] [phone]        Lorretta Harp, MD Follow up on 01/09/2022.   Specialties: Cardiology, Radiology Why: 2:30PM. Post PV procedure follow up Contact information: [address] [place] [address] [phone]                Discharge Instructions     Diet - low sodium heart healthy   Complete by: As directed    Discharge instructions   Complete by: As directed    No lifting over 5 lbs for 1 week. No sexual activity for 1 week. Keep procedure site clean & dry. If you notice increased pain, swelling, bleeding or pus, call/return!  You may shower, but no soaking baths/hot tubs/pools for 1 week.   Increase activity slowly   Complete by: As directed         Discharge Medications   Allergies as of 12/18/2021   No Known Allergies      Medication List  STOP taking these medications    naproxen sodium 220 MG tablet Commonly known as: ALEVE   omeprazole 20 MG tablet Commonly known as: PRILOSEC OTC       TAKE these medications    aspirin EC 81 MG tablet Take 81 mg by mouth daily.   atorvastatin 80 MG tablet Commonly known as: LIPITOR Take 1 tablet (80 mg total) by mouth daily.   Buprenorphine HCl-Naloxone HCl 8-2 MG Film Place 0.5-1 Film under the tongue See admin instructions. Take 0.5 film in the morning, 0.5 film at 1300, and 1 film at supper   CAL-MAG-ZINC PO Take 1 capsule by mouth daily.   clonazePAM 1 MG tablet Commonly known as: KLONOPIN Take 1-3 mg by mouth See admin instructions. Take 1 mg in the morning and 3 mg at night   clopidogrel 75 MG tablet Commonly known as: PLAVIX Take 1 tablet (75 mg total) by mouth daily with breakfast.   cyanocobalamin 500 MCG tablet Commonly known as: VITAMIN B12 Take 500 mcg by mouth daily.   lamoTRIgine 100 MG tablet Commonly known as: LAMICTAL Take 100 mg by mouth 2 (two) times daily.   lisinopril 5 MG tablet Commonly  known as: ZESTRIL Take 1 tablet (5 mg total) by mouth daily. Please make overdue appt with Dr. Radford Pax before anymore refills. Thank you 2nd attempt   metoprolol succinate 50 MG 24 hr tablet Commonly known as: TOPROL-XL Take 1 tablet (50 mg total) by mouth daily. Take with or immediately following a meal. What changed: when to take this   naphazoline-pheniramine 0.025-0.3 % ophthalmic solution Commonly known as: NAPHCON-A Place 2 drops into both eyes in the morning.   nitroGLYCERIN 0.4 MG SL tablet Commonly known as: NITROSTAT Place 1 tablet (0.4 mg total) under the tongue every 5 (five) minutes as needed for chest pain (MAX 3 TABLETS).   pantoprazole 40 MG tablet Commonly known as: Protonix Take 1 tablet (40 mg total) by mouth daily.   venlafaxine XR 75 MG 24 hr capsule Commonly known as: EFFEXOR-XR Take 75 mg by mouth 2 (two) times daily.   Vitamin D3 50 MCG (2000 UT) Tabs Take 2,000 Units by mouth daily.           Outstanding Labs/Studies   Outpatient LE doppler 12/18  Duration of Discharge Encounter   Greater than 30 minutes including physician time.  Hilbert Corrigan, PA 12/18/2021, 10:43 AM

## 2021-12-18 NOTE — Progress Notes (Addendum)
Rounding Note    Patient Name: Hector Byrd Date of Encounter: 12/18/2021  [place] Cardiologist: Fransico Him, MD   Subjective   Denies any CP or SOB. Ambulated ok this morning.   Inpatient Medications    Scheduled Meds:  aspirin EC  81 mg Oral Daily   atorvastatin  80 mg Oral Daily   clopidogrel  75 mg Oral Q breakfast   lisinopril  5 mg Oral Daily   metoprolol succinate  50 mg Oral Daily   sodium chloride flush  3 mL Intravenous Q12H   Continuous Infusions:  sodium chloride     PRN Meds: sodium chloride, acetaminophen, hydrALAZINE, labetalol, morphine injection, ondansetron (ZOFRAN) IV, oxyCODONE, sodium chloride flush   Vital Signs    Vitals:   12/17/21 2005 12/17/21 2234 12/18/21 0552 12/18/21 0817  BP:  (!) 144/66 (!) 148/75 (!) 145/72  Pulse:  66  83  Resp:    18  Temp: 98.2 F (36.8 C)  98.2 F (36.8 C) 98.1 F (36.7 C)  TempSrc: Oral  Oral Oral  SpO2:    99%  Weight:      Height:        Intake/Output Summary (Last 24 hours) at 12/18/2021 0938 Last data filed at 12/18/2021 2202 Gross per 24 hour  Intake 1072.53 ml  Output --  Net 1072.53 ml      12/17/2021    1:49 PM 11/29/2021    3:00 PM 09/24/2021    2:26 PM  Last 3 Weights  Weight (lbs) 124 lb 121 lb 6.4 oz 127 lb  Weight (kg) 56.246 kg 55.067 kg 57.607 kg      Telemetry    NSR without significant ST-T wave changes - Personally Reviewed  ECG    No EKG  Physical Exam   GEN: No acute distress.   Neck: No JVD Cardiac: RRR, no murmurs, rubs, or gallops.  R groin cath site covered with dressing, clean, dry, no significant pain Respiratory: Clear to auscultation bilaterally. GI: Soft, nontender, non-distended  MS: No edema; No deformity. Neuro:  Nonfocal  Psych: Normal affect   Labs    High Sensitivity Troponin:  No results for input(s): "TROPONINIHS" in the last 720 hours.   Chemistry Recent Labs  Lab 12/18/21 0306  NA 141  K 3.8  CL 106  CO2 24  GLUCOSE 81   BUN 13  CREATININE 1.02  CALCIUM 9.4  GFRNONAA >60  ANIONGAP 11    Lipids  Recent Labs  Lab 12/18/21 0306  CHOL 81  TRIG 74  HDL 32*  LDLCALC 34  CHOLHDL 2.5    Hematology Recent Labs  Lab 12/18/21 0306  WBC 9.0  RBC 3.89*  HGB 12.3*  HCT 35.7*  MCV 91.8  MCH 31.6  MCHC 34.5  RDW 13.3  PLT 246   Thyroid No results for input(s): "TSH", "FREET4" in the last 168 hours.  BNPNo results for input(s): "BNP", "PROBNP" in the last 168 hours.  DDimer No results for input(s): "DDIMER" in the last 168 hours.   Radiology    PERIPHERAL VASCULAR CATHETERIZATION  Result Date: 12/17/2021 Images from the original result were not included.  [id number] LOCATION:  FACILITY: [facility] PHYSICIAN: Quay Burow, M.D. Apr 18, 1955 DATE OF PROCEDURE:  12/17/2021 DATE OF DISCHARGE: PV Angiogram/Intervention History obtained from chart review.Hector Byrd is a 66 y.o.  seen appearing single Caucasian male with no children referred by Mack Hook NP for peripheral vascular valuation because of claudication.  His cardiologist is  Dr. Fransico Him.  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  He has treated hypertension and hyperlipidemia.  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  He has moderately severe LV dysfunction status post ICD implantation followed by Dr. Lovena Le.  He complains of bilateral lower extremity claudication right worse than left which is lifestyle limiting.  Doppler studies performed 12/21/2019 revealed a right ABI of 0.61 and a left of 0.79.  He had a high-frequency signal in his right external iliac artery and left common iliac artery.  Since I saw him a year and a half ago he had progressive lifestyle-limiting claudication.  Recent Doppler studies performed 11/23/2021 revealed  a right ABI of 0.83 and a left of 1.05.  He did have a high-frequency signal in his right extrailiac artery and a mildly elevated signal in his left common femoral artery.  He wishes to proceed with outpatient diagnostic peripheral angiography and potential endovascular therapy. Pre Procedure Diagnosis: Peripheral arterial disease Post Procedure Diagnosis: Peripheral arterial disease Operators: Dr. Quay Burow Procedures Performed:  1.  Ultrasound-guided right common femoral access  2.  Abdominal aortogram/bilateral iliac angiogram  3.  PTA and stent right extrailiac artery  PROCEDURE DESCRIPTION: The patient was brought to the second floor  Cardiac cath lab in the the postabsorptive state. He was premedicated with IV Versed and fentanyl. His right groin was prepped and shaved in usual sterile fashion. Xylocaine 1% was used for local anesthesia. A 5 French sheath was inserted into the right common femoral artery using standard Seldinger technique.  Ultrasound was used to identify the right common femoral artery and guide access.  A digital image of this was captured and placed the patient's chart.  A 5 French pigtail catheter was placed in distal abdominal aorta.  Distal abdominal aortography, bilateral iliac angiography was performed.  Omnipaque dye was used for the entirety of the case (124 cc total contrast the patient).  Retrograde pressures monitored in the case.  Angiographic Data: 1: Abdominal aorta-renal arteries widely patent.  The infrarenal abdominal aorta was tortuous and moderately atherosclerotic. 2: Left lower extremity-60% proximal eccentric left common iliac artery stenosis 3: Right lower extremity-40% proximal right common iliac artery stenosis and 80% right external iliac artery stenosis   [name] has a high-grade right extrailiac artery stenosis contributing to right lower extremity lifestyle-limiting claudication.  We will proceed with PTA and stenting. Procedure Description: The  5 French sheath was exchanged over an 035 versa core wire for a 6 [place] Brite tip sheath.  The patient received a total of 8000's of heparin with an ACT of 293.  I performed predilatation with a 5 mm x 2 cm Maverick balloon.  I then stented the diseased segment with a 8 mm x 4 cm Abbott nitinol absolute Pro self-expanding stent and postdilated with a 7 mm x 2 cm balloon resulting reduction of a 80% right extrailiac artery stenosis to 0% residual.  Patient tolerated the procedure well.  I exchanged the Brite tip sheath over the wire for a short 6 French sheath which was then secured in place.  The patient received 300 mg of p.o. clopidogrel prior to leaving the lab. Final Impression: Successful right external iliac artery PTA and stenting using a 8 mm x  40 mm long Abbott nitinol absolute Pro self-expanding stent for lifestyle limiting claudication.  Patient tolerated procedure well.  The sheath will be removed once ACT falls below less than 170 pressure will be held.  He will be discharged home in the morning on aspirin and clopidogrel.  We will arrange lower extremity arterial Doppler studies in unrefined office next week and I will see him back the week after. Quay Burow. MD, [facility] 12/17/2021 3:23 PM     Cardiac Studies   LE angiography 12/17/2021  Pre Procedure Diagnosis: Peripheral arterial disease   Post Procedure Diagnosis: Peripheral arterial disease   Operators: Dr. Quay Burow   Procedures Performed:             1.  Ultrasound-guided right common femoral access             2.  Abdominal aortogram/bilateral iliac angiogram             3.  PTA and stent right extrailiac artery  Angiographic Data:    1: Abdominal aorta-renal arteries widely patent.  The infrarenal abdominal aorta was tortuous and moderately atherosclerotic. 2: Left lower extremity-60% proximal eccentric left common iliac artery stenosis 3: Right lower extremity-40% proximal right common iliac artery stenosis and 80%  right external iliac artery stenosis   IMPRESSION: Mr.Lyerly has a high-grade right extrailiac artery stenosis contributing to right lower extremity lifestyle-limiting claudication.  We will proceed with PTA and stenting.   Procedure Description: The 5 French sheath was exchanged over an 035 versa core wire for a 6 [place] Brite tip sheath.  The patient received a total of 8000's of heparin with an ACT of 293.  I performed predilatation with a 5 mm x 2 cm Maverick balloon.  I then stented the diseased segment with a 8 mm x 4 cm Abbott nitinol absolute Pro self-expanding stent and postdilated with a 7 mm x 2 cm balloon resulting reduction of a 80% right extrailiac artery stenosis to 0% residual.  Patient tolerated the procedure well.  I exchanged the Brite tip sheath over the wire for a short 6 French sheath which was then secured in place.  The patient received 300 mg of p.o. clopidogrel prior to leaving the lab.   Final Impression: Successful right external iliac artery PTA and stenting using a 8 mm x 40 mm long Abbott nitinol absolute Pro self-expanding stent for lifestyle limiting claudication.  Patient tolerated procedure well.  The sheath will be removed once ACT falls below less than 170 pressure will be held.  He will be discharged home in the morning on aspirin and clopidogrel.  We will arrange lower extremity arterial Doppler studies in unrefined office next week and I will see him back the week after.              Patient Profile     67 y.o. male with PMH of CAD s/p CABG 1996, ICM s/p ICD followed by Dr. Lovena Le, HTN, HLD, and tobacco abuse who presented for LE angiography procedure by Dr. Gwenlyn Found  Assessment & Plan    PAD  - LE angiography 12/17/2021 showed widely patent renal arteries, 60% prox L common iliac, 40% prox R common iliac, 80% R external iliac artery. Patient underwent R external iliac artery PTA and stenting with 57m x 447mAbbott nitinol absolute Pro self-expanding stent  - doing  well. Ambulated without issue. No significant groin pain from cath, D/C today. Eager to leave. Continue ASA and plavix  CAD s/p CABG  1996: no chest pain  ICM s/p ICD: lisinopril and toprol XL   HTN: BP borderline elevated.   HLD  For questions or updates, please contact [place] Please consult [email or web address] for contact info under        Signed, Almyra Deforest, [place]  12/18/2021, 9:38 AM    Patient seen, examined. Available data reviewed. Agree with findings, assessment, and plan as outlined by Almyra Deforest, PA.  Thin male in no distress.  Heart is regular rate and rhythm with a 2/6 systolic murmur at the left lower sternal border, lungs are clear, right groin site is clear with no hematoma or ecchymosis.  Patient doing well after iliac stenting.  Stable for discharge as outlined above.  I reviewed his medications and agree with his discharge medicines as outlined.  Sherren Mocha, M.D. 12/18/2021 10:25 AM

## 2021-12-31 ENCOUNTER — Ambulatory Visit (HOSPITAL_COMMUNITY): Payer: Federal, State, Local not specified - PPO

## 2022-01-02 ENCOUNTER — Ambulatory Visit (HOSPITAL_BASED_OUTPATIENT_CLINIC_OR_DEPARTMENT_OTHER)
Admission: RE | Admit: 2022-01-02 | Discharge: 2022-01-02 | Disposition: A | Discharge status: Home / Self Care | Payer: Federal, State, Local not specified - PPO | Source: Ambulatory Visit | Attending: Cardiovascular Disease | Admitting: Cardiovascular Disease

## 2022-01-02 ENCOUNTER — Ambulatory Visit (HOSPITAL_COMMUNITY)
Admission: RE | Admit: 2022-01-02 | Discharge: 2022-01-02 | Disposition: A | Discharge status: Home / Self Care | Payer: Federal, State, Local not specified - PPO | Source: Ambulatory Visit | Attending: Cardiovascular Disease | Admitting: Cardiovascular Disease

## 2022-01-02 DIAGNOSIS — I739 Peripheral vascular disease, unspecified: Secondary | ICD-10-CM | POA: Insufficient documentation

## 2022-01-02 DIAGNOSIS — Z95820 Peripheral vascular angioplasty status with implants and grafts: Secondary | ICD-10-CM | POA: Diagnosis not present

## 2022-01-09 ENCOUNTER — Ambulatory Visit: Payer: Federal, State, Local not specified - PPO | Attending: Cardiovascular Disease | Admitting: Cardiovascular Disease

## 2022-01-09 ENCOUNTER — Encounter: Payer: Self-pay | Admitting: Cardiovascular Disease

## 2022-01-09 VITALS — BP 98/56 | HR 92 | Ht 67.0 in | Wt 115.6 lb

## 2022-01-09 DIAGNOSIS — I739 Peripheral vascular disease, unspecified: Secondary | ICD-10-CM | POA: Diagnosis not present

## 2022-01-09 NOTE — Assessment & Plan Note (Signed)
Hector Byrd returns today after his recent endovascular procedure which I performed 12//23.  He had an 80% right extrailiac artery stenosis which I stented with an 8 mm x 40 mm long absolute Pro nitinol self-expanding stent.  He had excellent angiographic and clinical result.  His claudication has resolved.  He did have a 60% left common iliac artery stenosis that I did not feel.  Physiologically significant.  He also is asymptomatic on that side.  His Dopplers improved and his velocities normalized.  He is on dual antiplatelet therapy.  We will recheck lower extremity arterial Doppler studies in 6 months.

## 2022-01-09 NOTE — Progress Notes (Signed)
01/09/2022 Carrel Leather   [birth year]  [id number]  Primary Physician [name], NP-C Primary Cardiologist: Lorretta Harp MD Hector Byrd, Hector Byrd  HPI:  Hector Byrd is a 66 y.o.    seen appearing single Caucasian male with no children referred by Mack Hook NP for peripheral vascular valuation because of claudication.  His cardiologist is Dr. Fransico Byrd.  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  He has treated hypertension and hyperlipidemia.  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  He has moderately severe LV dysfunction status post ICD implantation followed by Dr. Lovena Le.  He complains of bilateral lower extremity claudication right worse than left which is lifestyle limiting.  Doppler studies performed 12/21/2019 revealed a right ABI of 0.61 and a left of 0.79.  He had a high-frequency signal in his right external iliac artery and left common iliac artery.    He has been experiencing progressive lifestyle-limiting claudication.  Recent Doppler studies performed 11/23/2021 revealed a right ABI of 0.83 and a left of 1.05.  He did have a high-frequency signal in his right extrailiac artery and a mildly elevated signal in his left common femoral artery.  I performed outpatient peripheral angiography on Byrd 12/17/2021 revealing 80% right extrailiac artery stenosis which I stented with a 8 mm x 40 mm long absolute Pro nitinol self-expanding stent with excellent result.  He was discharged home the following day.  His groin is stable and the puncture wound is flat and healed.  He did have a 60% left common iliac artery stenosis that did not appear to be physiologically significant.  He is asymptomatic on that side.  Current Meds  Medication Sig   atorvastatin  (LIPITOR) 80 MG tablet Take 1 tablet (80 mg total) by mouth daily.   Buprenorphine HCl-Naloxone HCl 8-2 MG FILM Place 0.5-1 Film under the tongue See admin instructions. Take 0.5 film in the morning, 0.5 film at 1300, and 1 film at supper   Calcium-Magnesium-Zinc (CAL-MAG-ZINC PO) Take 1 capsule by mouth daily.   Cholecalciferol (VITAMIN D3) 50 MCG (2000 UT) TABS Take 2,000 Units by mouth daily.   clonazePAM (KLONOPIN) 1 MG tablet Take 1-3 mg by mouth See admin instructions. Take 1 mg in the morning and 3 mg at night   clopidogrel (PLAVIX) 75 MG tablet Take 1 tablet (75 mg total) by mouth daily with breakfast.   lamoTRIgine (LAMICTAL) 100 MG tablet Take 100 mg by mouth 2 (two) times daily.   lisinopril (ZESTRIL) 5 MG tablet Take 1 tablet (5 mg total) by mouth daily. Please make overdue appt with Dr. Radford Pax before anymore refills. Thank you 2nd attempt   metoprolol succinate (TOPROL-XL) 50 MG 24 hr tablet Take 1 tablet (50 mg total) by mouth daily. Take with or immediately following a meal. (Patient taking differently: Take 50 mg by mouth at bedtime. Take with or immediately following a meal.)   naphazoline-pheniramine (NAPHCON-A) 0.025-0.3 % ophthalmic solution Place 2 drops into both eyes in the morning.   nitroGLYCERIN (NITROSTAT) 0.4 MG SL tablet Place 1 tablet (0.4 mg total) under the tongue every 5 (five) minutes as needed for chest pain (MAX 3 TABLETS).   pantoprazole (PROTONIX) 40 MG tablet Take 1 tablet (40 mg total)  by mouth daily.   venlafaxine XR (EFFEXOR-XR) 75 MG 24 hr capsule Take 75 mg by mouth 2 (two) times daily.   vitamin B-12 (CYANOCOBALAMIN) 500 MCG tablet Take 500 mcg by mouth daily.     No Known Allergies  Social History   Socioeconomic History   Marital status: Single    Spouse name: Not on file   Number of children: Not on file   Years of education: Not on file   Highest education level: Not on file  Occupational History   Not on file  Tobacco Use   Smoking  status: Every Day    Types: Cigars   Smokeless tobacco: Never   Tobacco comments:    uses cigarelle- 4 per day  Vaping Use   Vaping Use: Never used  Substance and Sexual Activity   Alcohol use: No    Comment: HISTORY OF DRUG & ETOH 09/19/2004   Drug use: No    Comment: none since 09/19/2004   Sexual activity: Never  Other Topics Concern   Not on file  Social History Narrative   Not on file   Social Determinants of Health   Financial Resource Strain: Not on file  Food Insecurity: Not on file  Transportation Needs: Not on file  Physical Activity: Not on file  Stress: Not on file  Social Connections: Not on file  Intimate Partner Violence: Not on file     Review of Systems: General: negative for chills, fever, night sweats or weight changes.  Cardiovascular: negative for chest pain, dyspnea on exertion, edema, orthopnea, palpitations, paroxysmal nocturnal dyspnea or shortness of breath Dermatological: negative for rash Respiratory: negative for cough or wheezing Urologic: negative for hematuria Abdominal: negative for nausea, vomiting, diarrhea, bright red blood per rectum, melena, or hematemesis Neurologic: negative for visual changes, syncope, or dizziness All other systems reviewed and are otherwise negative except as noted above.    Blood pressure (!) 98/56, pulse 92, height '5\' 7"'$  (1.702 m), weight 115 lb 9.6 oz (52.4 kg), SpO2 98 %.  General appearance: alert and no distress Neck: no adenopathy, no carotid bruit, no JVD, supple, symmetrical, trachea midline, and thyroid not enlarged, symmetric, no tenderness/mass/nodules Lungs: clear to auscultation bilaterally Heart: regular rate and rhythm, S1, S2 normal, no murmur, click, rub or gallop Extremities: extremities normal, atraumatic, no cyanosis or edema Pulses: 2+ and symmetric Skin: Skin color, texture, turgor normal. No rashes or lesions Neurologic: Grossly normal  EKG not performed today  ASSESSMENT AND PLAN:    Claudication in peripheral vascular disease [facility]) [name] returns today after his recent endovascular procedure which I performed 12//23.  He had an 80% right extrailiac artery stenosis which I stented with an 8 mm x 40 mm long absolute Pro nitinol self-expanding stent.  He had excellent angiographic and clinical result.  His claudication has resolved.  He did have a 60% left common iliac artery stenosis that I did not feel.  Physiologically significant.  He also is asymptomatic on that side.  His Dopplers improved and his velocities normalized.  He is on dual antiplatelet therapy.  We will recheck lower extremity arterial Doppler studies in 6 months.     Lorretta Harp MD FACP,FACC,FAHA, [facility] 01/09/2022 2:44 PM

## 2022-01-09 NOTE — Patient Instructions (Signed)
Medication Instructions:  Your physician recommends that you continue on your current medications as directed. Please refer to the Current Medication list given to you today.  *If you need a refill on your cardiac medications before your next appointment, please call your pharmacy*   Testing/Procedures: Your physician has requested that you have an Aorta/Iliac Duplex. This will be take place at [facility], [address].  No food after 11PM the night before.  Water is OK. (Don't drink liquids if you have been instructed not to for ANOTHER test) Avoid foods that produce bowel gas, for 24 hours prior to exam (see below). No breakfast, no chewing gum, no smoking or carbonated beverages. Patient may take morning medications with water. Come in for test at least 15 minutes early to register. To be done in June 2024.   Your physician has requested that you have an ankle brachial index (ABI). During this test an ultrasound and blood pressure cuff are used to evaluate the arteries that supply the arms and legs with blood. Allow thirty minutes for this exam. There are no restrictions or special instructions. This will take place at [facility], [address].  To be done in June 2024.     Follow-Up: At [facility], you and your health needs are our priority.  As part of our continuing mission to provide you with exceptional heart care, we have created designated Provider Care Teams.  These Care Teams include your primary Cardiologist (physician) and Advanced Practice Providers (APPs -  Physician Assistants and Nurse Practitioners) who all work together to provide you with the care you need, when you need it.  We recommend signing up for the patient portal called "MyChart".  Sign up information is provided on this After Visit Summary.  MyChart is used to connect with patients for Virtual Visits (Telemedicine).  Patients are able to view lab/test results, encounter notes, upcoming  appointments, etc.  Non-urgent messages can be sent to your provider as well.   To learn more about what you can do with MyChart, go to [email or web address].    Your next appointment:   6 month(s)  The format for your next appointment:   In Person  Provider:   Quay Burow, MD

## 2022-01-10 NOTE — Progress Notes (Signed)
Remote ICD transmission.   

## 2022-01-15 ENCOUNTER — Other Ambulatory Visit (HOSPITAL_COMMUNITY): Payer: Self-pay

## 2022-01-21 ENCOUNTER — Other Ambulatory Visit (HOSPITAL_COMMUNITY): Payer: Self-pay

## 2022-01-23 ENCOUNTER — Ambulatory Visit: Payer: Federal, State, Local not specified - PPO | Admitting: Cardiovascular Disease

## 2022-01-31 ENCOUNTER — Ambulatory Visit: Payer: Self-pay | Admitting: Nurse Practitioner

## 2022-02-07 ENCOUNTER — Ambulatory Visit: Payer: Medicare Other | Admitting: Nurse Practitioner

## 2022-02-08 ENCOUNTER — Encounter: Payer: Self-pay | Admitting: Nurse Practitioner

## 2022-02-15 ENCOUNTER — Encounter: Payer: Self-pay | Admitting: Nurse Practitioner

## 2022-02-21 ENCOUNTER — Other Ambulatory Visit: Payer: Self-pay | Admitting: Cardiology

## 2022-02-21 DIAGNOSIS — I10 Essential (primary) hypertension: Secondary | ICD-10-CM

## 2022-03-12 ENCOUNTER — Ambulatory Visit (INDEPENDENT_AMBULATORY_CARE_PROVIDER_SITE_OTHER): Payer: Federal, State, Local not specified - PPO

## 2022-03-12 DIAGNOSIS — I255 Ischemic cardiomyopathy: Secondary | ICD-10-CM

## 2022-03-14 ENCOUNTER — Telehealth: Payer: Self-pay

## 2022-03-14 LAB — CUP PACEART REMOTE DEVICE CHECK
Battery Voltage: 12
Date Time Interrogation Session: 20240229074051
Implantable Lead Connection Status: 753985
Implantable Lead Implant Date: 20141124
Implantable Lead Location: 753860
Implantable Lead Model: 365
Implantable Lead Serial Number: 10552343
Implantable Pulse Generator Implant Date: 20141124
Pulse Gen Model: 383594
Pulse Gen Serial Number: 60740583

## 2022-03-14 NOTE — Telephone Encounter (Signed)
  Scheduled remote reviewed. Normal device function.   Battery estimated 12% - route to triage Next remote to be determined LA  LM on cell VM (okay per DPR) with details of device closing in on ERI mark (projected 25mhs) and that we will be setting him up on monthly remote checks to watch for indicator mark. I did leave reassurance that after reaching the ERI mark, there is a guaranteed at least 311m time frame for replacement thereafter and we would be calling him at the ERI mark to schedule him with Dr. TaLovena Leo review for gen change.  He was given device clinic number to call back if any questions.

## 2022-03-19 ENCOUNTER — Encounter: Payer: Self-pay | Admitting: Nurse Practitioner

## 2022-03-19 ENCOUNTER — Ambulatory Visit (INDEPENDENT_AMBULATORY_CARE_PROVIDER_SITE_OTHER): Payer: Federal, State, Local not specified - PPO | Admitting: Nurse Practitioner

## 2022-03-19 ENCOUNTER — Other Ambulatory Visit (INDEPENDENT_AMBULATORY_CARE_PROVIDER_SITE_OTHER): Payer: Federal, State, Local not specified - PPO

## 2022-03-19 ENCOUNTER — Telehealth: Payer: Self-pay

## 2022-03-19 VITALS — BP 126/68 | HR 68 | Wt 95.0 lb

## 2022-03-19 DIAGNOSIS — R1010 Upper abdominal pain, unspecified: Secondary | ICD-10-CM | POA: Diagnosis not present

## 2022-03-19 DIAGNOSIS — R627 Adult failure to thrive: Secondary | ICD-10-CM

## 2022-03-19 DIAGNOSIS — R197 Diarrhea, unspecified: Secondary | ICD-10-CM | POA: Diagnosis not present

## 2022-03-19 LAB — COMPREHENSIVE METABOLIC PANEL
ALT: 17 U/L (ref 0–53)
AST: 22 U/L (ref 0–37)
Albumin: 3.5 g/dL (ref 3.5–5.2)
Alkaline Phosphatase: 116 U/L (ref 39–117)
BUN: 21 mg/dL (ref 6–23)
CO2: 29 mEq/L (ref 19–32)
Calcium: 9.9 mg/dL (ref 8.4–10.5)
Chloride: 101 mEq/L (ref 96–112)
Creatinine, Ser: 1.08 mg/dL (ref 0.40–1.50)
GFR: 70.21 mL/min (ref >60.00)
Glucose, Bld: 108 mg/dL — ABNORMAL HIGH (ref 70–99)
Potassium: 4.3 mEq/L (ref 3.5–5.1)
Sodium: 139 mEq/L (ref 135–145)
Total Bilirubin: 1 mg/dL (ref 0.2–1.2)
Total Protein: 6.6 g/dL (ref 6.0–8.3)

## 2022-03-19 LAB — CBC WITH DIFFERENTIAL/PLATELET
Basophils Absolute: 0.1 10*3/uL (ref 0.0–0.1)
Basophils Relative: 0.4 % (ref 0.0–3.0)
Eosinophils Absolute: 0 10*3/uL (ref 0.0–0.7)
Eosinophils Relative: 0.1 % (ref 0.0–5.0)
HCT: 38.7 % — ABNORMAL LOW (ref 39.0–52.0)
Hemoglobin: 12.9 g/dL — ABNORMAL LOW (ref 13.0–17.0)
Lymphocytes Relative: 10.5 % — ABNORMAL LOW (ref 12.0–46.0)
Lymphs Abs: 1.4 10*3/uL (ref 0.7–4.0)
MCHC: 33.2 g/dL (ref 30.0–36.0)
MCV: 92.4 fl (ref 78.0–100.0)
Monocytes Absolute: 0.9 10*3/uL (ref 0.1–1.0)
Monocytes Relative: 6.6 % (ref 3.0–12.0)
Neutro Abs: 11.2 10*3/uL — ABNORMAL HIGH (ref 1.4–7.7)
Neutrophils Relative %: 82.4 % — ABNORMAL HIGH (ref 43.0–77.0)
Platelets: 292 10*3/uL (ref 150.0–400.0)
RBC: 4.19 Mil/uL — ABNORMAL LOW (ref 4.22–5.81)
RDW: 15.1 % (ref 11.5–15.5)
WBC: 13.6 10*3/uL — ABNORMAL HIGH (ref 4.0–10.5)

## 2022-03-19 NOTE — Patient Instructions (Addendum)
I advised you to go to the emergency room for labs and abdominal/pelvic CT with intension to admit for failure to thrive, however, at this time you decline to do so.   Go to our lab to complete the ordered blood and stool tests   Our office will contact you to schedule an abdominal/pelvic CT scan after your labs results have been reviewed  Go to the emergency  room if you develop severe abdominal pain, dizziness, chest pain or shortness of breath   Your provider has requested that you go to the basement level for lab work before leaving today. Press "B" on the elevator. The lab is located at the first door on the left as you exit the elevator.   Follow up with your primary doctor.  Due to recent changes in healthcare laws, you may see the results of your imaging and laboratory studies on MyChart before your provider has had a chance to review them.  We understand that in some cases there may be results that are confusing or concerning to you. Not all laboratory results come back in the same time frame and the provider may be waiting for multiple results in order to interpret others.  Please give [place] 48 hours in order for your provider to thoroughly review all the results before contacting the office for clarification of your results.    Thank you for trusting me with your gastrointestinal care!   Carl Best, CRNP
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
## 2022-03-19 NOTE — Telephone Encounter (Signed)
Contacted pt and left voicemail to call back regarding CT scan.

## 2022-03-19 NOTE — Addendum Note (Signed)
Addended by: Annabell Howells on: 03/19/2022 04:35 PM   Modules accepted: Orders

## 2022-03-20 ENCOUNTER — Ambulatory Visit (HOSPITAL_COMMUNITY): Payer: Federal, State, Local not specified - PPO

## 2022-03-20 ENCOUNTER — Telehealth: Payer: Self-pay

## 2022-03-20 NOTE — Telephone Encounter (Signed)
Contacted pt and pt is aware of labs results and has vied them via [place]. Pt agreed to get CT Rescheduled.

## 2022-03-23 NOTE — Progress Notes (Signed)
Agree with assessment/plan. Refused adm  Carmell Austria, MD [facility] [phone]

## 2022-03-27 ENCOUNTER — Telehealth: Payer: Self-pay | Admitting: Cardiology

## 2022-03-27 NOTE — Telephone Encounter (Signed)
Called to r/s appt with Dr. Radford Pax on 03/25, first call the MB was full, second call we LVM, third call - the VM not set up. Will send MyChart message to pt

## 2022-03-31 ENCOUNTER — Ambulatory Visit (HOSPITAL_BASED_OUTPATIENT_CLINIC_OR_DEPARTMENT_OTHER): Admission: RE | Admit: 2022-03-31 | Payer: Federal, State, Local not specified - PPO | Source: Ambulatory Visit

## 2022-04-08 ENCOUNTER — Emergency Department (HOSPITAL_COMMUNITY): Payer: Medicare Other

## 2022-04-08 ENCOUNTER — Ambulatory Visit: Payer: Federal, State, Local not specified - PPO | Admitting: Cardiology

## 2022-04-08 ENCOUNTER — Other Ambulatory Visit: Payer: Self-pay

## 2022-04-08 ENCOUNTER — Encounter (HOSPITAL_COMMUNITY): Payer: Self-pay

## 2022-04-08 ENCOUNTER — Inpatient Hospital Stay (HOSPITAL_COMMUNITY): Payer: Medicare Other

## 2022-04-08 ENCOUNTER — Inpatient Hospital Stay (HOSPITAL_COMMUNITY)
Admission: EM | Admit: 2022-04-08 | Discharge: 2022-04-13 | DRG: 435 | Disposition: A | Discharge status: Hospice | Payer: Medicare Other | Attending: Internal Medicine | Admitting: Internal Medicine

## 2022-04-08 DIAGNOSIS — Z8673 Personal history of transient ischemic attack (TIA), and cerebral infarction without residual deficits: Secondary | ICD-10-CM

## 2022-04-08 DIAGNOSIS — L89130 Pressure ulcer of right lower back, unstageable: Secondary | ICD-10-CM | POA: Diagnosis present

## 2022-04-08 DIAGNOSIS — Z951 Presence of aortocoronary bypass graft: Secondary | ICD-10-CM | POA: Diagnosis not present

## 2022-04-08 DIAGNOSIS — Z515 Encounter for palliative care: Secondary | ICD-10-CM | POA: Diagnosis not present

## 2022-04-08 DIAGNOSIS — Z66 Do not resuscitate: Secondary | ICD-10-CM | POA: Diagnosis not present

## 2022-04-08 DIAGNOSIS — X088XXD Exposure to other specified smoke, fire and flames, subsequent encounter: Secondary | ICD-10-CM | POA: Diagnosis present

## 2022-04-08 DIAGNOSIS — Z87891 Personal history of nicotine dependence: Secondary | ICD-10-CM | POA: Diagnosis not present

## 2022-04-08 DIAGNOSIS — K219 Gastro-esophageal reflux disease without esophagitis: Secondary | ICD-10-CM | POA: Diagnosis present

## 2022-04-08 DIAGNOSIS — I6389 Other cerebral infarction: Secondary | ICD-10-CM | POA: Diagnosis not present

## 2022-04-08 DIAGNOSIS — R0902 Hypoxemia: Secondary | ICD-10-CM | POA: Diagnosis not present

## 2022-04-08 DIAGNOSIS — Z955 Presence of coronary angioplasty implant and graft: Secondary | ICD-10-CM

## 2022-04-08 DIAGNOSIS — I110 Hypertensive heart disease with heart failure: Secondary | ICD-10-CM | POA: Diagnosis present

## 2022-04-08 DIAGNOSIS — R918 Other nonspecific abnormal finding of lung field: Secondary | ICD-10-CM | POA: Diagnosis not present

## 2022-04-08 DIAGNOSIS — C259 Malignant neoplasm of pancreas, unspecified: Secondary | ICD-10-CM | POA: Diagnosis not present

## 2022-04-08 DIAGNOSIS — E43 Unspecified severe protein-calorie malnutrition: Secondary | ICD-10-CM | POA: Diagnosis present

## 2022-04-08 DIAGNOSIS — I739 Peripheral vascular disease, unspecified: Secondary | ICD-10-CM | POA: Diagnosis present

## 2022-04-08 DIAGNOSIS — Z79899 Other long term (current) drug therapy: Secondary | ICD-10-CM

## 2022-04-08 DIAGNOSIS — F17290 Nicotine dependence, other tobacco product, uncomplicated: Secondary | ICD-10-CM | POA: Diagnosis present

## 2022-04-08 DIAGNOSIS — R64 Cachexia: Secondary | ICD-10-CM | POA: Diagnosis present

## 2022-04-08 DIAGNOSIS — Z833 Family history of diabetes mellitus: Secondary | ICD-10-CM

## 2022-04-08 DIAGNOSIS — G4700 Insomnia, unspecified: Secondary | ICD-10-CM | POA: Diagnosis not present

## 2022-04-08 DIAGNOSIS — K862 Cyst of pancreas: Secondary | ICD-10-CM | POA: Diagnosis not present

## 2022-04-08 DIAGNOSIS — K760 Fatty (change of) liver, not elsewhere classified: Secondary | ICD-10-CM | POA: Diagnosis present

## 2022-04-08 DIAGNOSIS — W19XXXA Unspecified fall, initial encounter: Secondary | ICD-10-CM | POA: Diagnosis present

## 2022-04-08 DIAGNOSIS — Z602 Problems related to living alone: Secondary | ICD-10-CM | POA: Diagnosis present

## 2022-04-08 DIAGNOSIS — I472 Ventricular tachycardia, unspecified: Secondary | ICD-10-CM | POA: Diagnosis not present

## 2022-04-08 DIAGNOSIS — L89621 Pressure ulcer of left heel, stage 1: Secondary | ICD-10-CM | POA: Diagnosis present

## 2022-04-08 DIAGNOSIS — F418 Other specified anxiety disorders: Secondary | ICD-10-CM | POA: Diagnosis not present

## 2022-04-08 DIAGNOSIS — I714 Abdominal aortic aneurysm, without rupture, unspecified: Secondary | ICD-10-CM | POA: Diagnosis present

## 2022-04-08 DIAGNOSIS — F32A Depression, unspecified: Secondary | ICD-10-CM | POA: Diagnosis present

## 2022-04-08 DIAGNOSIS — Z681 Body mass index (BMI) 19 or less, adult: Secondary | ICD-10-CM | POA: Diagnosis not present

## 2022-04-08 DIAGNOSIS — E861 Hypovolemia: Secondary | ICD-10-CM | POA: Diagnosis present

## 2022-04-08 DIAGNOSIS — Z85528 Personal history of other malignant neoplasm of kidney: Secondary | ICD-10-CM

## 2022-04-08 DIAGNOSIS — E785 Hyperlipidemia, unspecified: Secondary | ICD-10-CM | POA: Diagnosis present

## 2022-04-08 DIAGNOSIS — C762 Malignant neoplasm of abdomen: Secondary | ICD-10-CM | POA: Diagnosis not present

## 2022-04-08 DIAGNOSIS — J439 Emphysema, unspecified: Secondary | ICD-10-CM | POA: Diagnosis present

## 2022-04-08 DIAGNOSIS — R531 Weakness: Secondary | ICD-10-CM | POA: Diagnosis present

## 2022-04-08 DIAGNOSIS — I77810 Thoracic aortic ectasia: Secondary | ICD-10-CM | POA: Diagnosis present

## 2022-04-08 DIAGNOSIS — K8689 Other specified diseases of pancreas: Secondary | ICD-10-CM | POA: Diagnosis not present

## 2022-04-08 DIAGNOSIS — R627 Adult failure to thrive: Secondary | ICD-10-CM | POA: Diagnosis present

## 2022-04-08 DIAGNOSIS — Z95810 Presence of automatic (implantable) cardiac defibrillator: Secondary | ICD-10-CM

## 2022-04-08 DIAGNOSIS — Z789 Other specified health status: Secondary | ICD-10-CM | POA: Diagnosis not present

## 2022-04-08 DIAGNOSIS — I5042 Chronic combined systolic (congestive) and diastolic (congestive) heart failure: Secondary | ICD-10-CM | POA: Diagnosis not present

## 2022-04-08 DIAGNOSIS — R7989 Other specified abnormal findings of blood chemistry: Secondary | ICD-10-CM | POA: Diagnosis not present

## 2022-04-08 DIAGNOSIS — R634 Abnormal weight loss: Secondary | ICD-10-CM | POA: Diagnosis not present

## 2022-04-08 DIAGNOSIS — L89140 Pressure ulcer of left lower back, unstageable: Secondary | ICD-10-CM | POA: Diagnosis present

## 2022-04-08 DIAGNOSIS — I2510 Atherosclerotic heart disease of native coronary artery without angina pectoris: Secondary | ICD-10-CM | POA: Diagnosis not present

## 2022-04-08 DIAGNOSIS — Y92019 Unspecified place in single-family (private) house as the place of occurrence of the external cause: Secondary | ICD-10-CM | POA: Diagnosis not present

## 2022-04-08 DIAGNOSIS — Z7902 Long term (current) use of antithrombotics/antiplatelets: Secondary | ICD-10-CM

## 2022-04-08 DIAGNOSIS — R109 Unspecified abdominal pain: Secondary | ICD-10-CM | POA: Diagnosis present

## 2022-04-08 DIAGNOSIS — C786 Secondary malignant neoplasm of retroperitoneum and peritoneum: Secondary | ICD-10-CM | POA: Diagnosis present

## 2022-04-08 DIAGNOSIS — G2581 Restless legs syndrome: Secondary | ICD-10-CM | POA: Diagnosis present

## 2022-04-08 DIAGNOSIS — M1990 Unspecified osteoarthritis, unspecified site: Secondary | ICD-10-CM | POA: Diagnosis present

## 2022-04-08 DIAGNOSIS — S0083XA Contusion of other part of head, initial encounter: Secondary | ICD-10-CM | POA: Diagnosis present

## 2022-04-08 DIAGNOSIS — I255 Ischemic cardiomyopathy: Secondary | ICD-10-CM | POA: Diagnosis present

## 2022-04-08 DIAGNOSIS — Z7189 Other specified counseling: Secondary | ICD-10-CM | POA: Diagnosis not present

## 2022-04-08 DIAGNOSIS — R296 Repeated falls: Secondary | ICD-10-CM | POA: Diagnosis not present

## 2022-04-08 DIAGNOSIS — Z711 Person with feared health complaint in whom no diagnosis is made: Secondary | ICD-10-CM | POA: Diagnosis not present

## 2022-04-08 DIAGNOSIS — I351 Nonrheumatic aortic (valve) insufficiency: Secondary | ICD-10-CM | POA: Diagnosis present

## 2022-04-08 DIAGNOSIS — F419 Anxiety disorder, unspecified: Secondary | ICD-10-CM | POA: Diagnosis present

## 2022-04-08 DIAGNOSIS — I6381 Other cerebral infarction due to occlusion or stenosis of small artery: Secondary | ICD-10-CM | POA: Diagnosis not present

## 2022-04-08 DIAGNOSIS — Z818 Family history of other mental and behavioral disorders: Secondary | ICD-10-CM

## 2022-04-08 DIAGNOSIS — Z809 Family history of malignant neoplasm, unspecified: Secondary | ICD-10-CM

## 2022-04-08 DIAGNOSIS — R630 Anorexia: Secondary | ICD-10-CM | POA: Diagnosis present

## 2022-04-08 DIAGNOSIS — T24001D Burn of unspecified degree of unspecified site of right lower limb, except ankle and foot, subsequent encounter: Secondary | ICD-10-CM

## 2022-04-08 DIAGNOSIS — R52 Pain, unspecified: Secondary | ICD-10-CM | POA: Diagnosis not present

## 2022-04-08 DIAGNOSIS — R54 Age-related physical debility: Secondary | ICD-10-CM | POA: Diagnosis present

## 2022-04-08 LAB — COMPREHENSIVE METABOLIC PANEL
ALT: 26 U/L (ref 0–44)
AST: 34 U/L (ref 15–41)
Albumin: 3.4 g/dL — ABNORMAL LOW (ref 3.5–5.0)
Alkaline Phosphatase: 76 U/L (ref 38–126)
Anion gap: 15 (ref 5–15)
BUN: 49 mg/dL — ABNORMAL HIGH (ref 8–23)
CO2: 25 mmol/L (ref 22–32)
Calcium: 9.4 mg/dL (ref 8.9–10.3)
Chloride: 96 mmol/L — ABNORMAL LOW (ref 98–111)
Creatinine, Ser: 1.14 mg/dL (ref 0.61–1.24)
GFR, Estimated: >60 mL/min (ref >60)
Glucose, Bld: 117 mg/dL — ABNORMAL HIGH (ref 70–99)
Potassium: 4.3 mmol/L (ref 3.5–5.1)
Sodium: 136 mmol/L (ref 135–145)
Total Bilirubin: 1.7 mg/dL — ABNORMAL HIGH (ref 0.3–1.2)
Total Protein: 6 g/dL — ABNORMAL LOW (ref 6.5–8.1)

## 2022-04-08 LAB — CBC WITH DIFFERENTIAL/PLATELET
Abs Immature Granulocytes: 0.03 10*3/uL (ref 0.00–0.07)
Basophils Absolute: 0 10*3/uL (ref 0.0–0.1)
Basophils Relative: 0 %
Eosinophils Absolute: 0 10*3/uL (ref 0.0–0.5)
Eosinophils Relative: 0 %
HCT: 39.8 % (ref 39.0–52.0)
Hemoglobin: 13.7 g/dL (ref 13.0–17.0)
Immature Granulocytes: 0 %
Lymphocytes Relative: 10 %
Lymphs Abs: 0.9 10*3/uL (ref 0.7–4.0)
MCH: 31.7 pg (ref 26.0–34.0)
MCHC: 34.4 g/dL (ref 30.0–36.0)
MCV: 92.1 fL (ref 80.0–100.0)
Monocytes Absolute: 0.7 10*3/uL (ref 0.1–1.0)
Monocytes Relative: 8 %
Neutro Abs: 7 10*3/uL (ref 1.7–7.7)
Neutrophils Relative %: 82 %
Platelets: 235 10*3/uL (ref 150–400)
RBC: 4.32 MIL/uL (ref 4.22–5.81)
RDW: 15.3 % (ref 11.5–15.5)
WBC: 8.7 10*3/uL (ref 4.0–10.5)
nRBC: 0 % (ref 0.0–0.2)

## 2022-04-08 LAB — AMMONIA: Ammonia: 25 umol/L (ref 9–35)

## 2022-04-08 LAB — ETHANOL: Alcohol, Ethyl (B): <10 mg/dL (ref <10)

## 2022-04-08 LAB — TROPONIN I (HIGH SENSITIVITY): Troponin I (High Sensitivity): 110 ng/L (ref <18)

## 2022-04-08 MED ORDER — ATORVASTATIN CALCIUM 80 MG PO TABS
80.0000 mg | ORAL_TABLET | Freq: Every day | ORAL | Status: DC
  Administered 2022-04-09 – 2022-04-13 (×5): 80 mg via ORAL
  Filled 2022-04-08 (×5): qty 1

## 2022-04-08 MED ORDER — METOPROLOL SUCCINATE ER 25 MG PO TB24
50.0000 mg | ORAL_TABLET | Freq: Every day | ORAL | Status: DC
  Administered 2022-04-09 – 2022-04-13 (×5): 50 mg via ORAL
  Filled 2022-04-08 (×5): qty 2

## 2022-04-08 MED ORDER — CLOPIDOGREL BISULFATE 75 MG PO TABS: 75.0000 mg | ORAL_TABLET | Freq: Every day | ORAL | Status: DC

## 2022-04-08 MED ORDER — LAMOTRIGINE 100 MG PO TABS
100.0000 mg | ORAL_TABLET | Freq: Two times a day (BID) | ORAL | Status: DC
  Administered 2022-04-09 – 2022-04-13 (×10): 100 mg via ORAL
  Filled 2022-04-08 (×5): qty 1
  Filled 2022-04-08: qty 4
  Filled 2022-04-08 (×4): qty 1

## 2022-04-08 MED ORDER — SODIUM CHLORIDE 0.9 % IV SOLN: INTRAVENOUS | Status: DC

## 2022-04-08 MED ORDER — PANTOPRAZOLE SODIUM 40 MG PO TBEC
40.0000 mg | DELAYED_RELEASE_TABLET | Freq: Every day | ORAL | Status: DC
  Administered 2022-04-09 – 2022-04-13 (×5): 40 mg via ORAL
  Filled 2022-04-08 (×5): qty 1

## 2022-04-08 MED ORDER — ENOXAPARIN SODIUM 40 MG/0.4ML IJ SOSY: 40.0000 mg | PREFILLED_SYRINGE | INTRAMUSCULAR | Status: DC

## 2022-04-08 MED ORDER — STROKE: EARLY STAGES OF RECOVERY BOOK
Freq: Once | Status: AC
  Filled 2022-04-08: qty 1

## 2022-04-08 MED ORDER — ACETAMINOPHEN 160 MG/5ML PO SOLN: 650.0000 mg | ORAL | Status: DC | PRN

## 2022-04-08 MED ORDER — ACETAMINOPHEN 650 MG RE SUPP: 650.0000 mg | RECTAL | Status: DC | PRN

## 2022-04-08 MED ORDER — ACETAMINOPHEN 325 MG PO TABS: 650.0000 mg | ORAL_TABLET | ORAL | Status: DC | PRN
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
## 2022-04-08 NOTE — H&P (Signed)
History and Physical    Hector Byrd [id number] DOB: 03-31-1955 DOA: 04/08/2022  PCP: Orma Render, NP   Patient coming from: Home   Chief Complaint: Weakness, falls   HPI: Hector Byrd is a 67 y.o. male with medical history significant for depression, CAD, PAD, cardiomyopathy with ICD, ascending aortic dilatation, and recent abdominal pain, loss of appetite, and weight loss presents to the emergency department with progressive generalized weakness and multiple falls.  Patient reports upper abdominal pain, loose stools, and weight loss progressing over the past 3 months or so.  He has also been experiencing nausea but no vomiting.  He denies any melena, hematochezia, fever, or chills.  He has more recently been developing worsening weakness with frequent falls.  1 week ago, he felt as though the right leg was weaker, but now reports bilateral lower extremity weakness with inability to ambulate.  ED Course: Upon arrival to the ED, patient is found to be afebrile and saturating well on room air with stable blood pressure.  EKG demonstrates sinus rhythm with LVH and QTc 570 ms.  Chest x-ray is negative for acute findings.  Head CT negative for acute intracranial hemorrhage but notable for age-indeterminate left thalamic infarction.  Labs are most notable for elevated BUN to creatinine ratio, total bilirubin 1.7, and troponin 110.    Neurology and cardiology were consulted with ED physician and hospitalists asked to admit.   Review of Systems:  All other systems reviewed and apart from HPI, are negative.  Past Medical History:  Diagnosis Date   AAA (abdominal aortic aneurysm) ([place])    3.0cm by duplex 12/2019   AICD (automatic cardioverter/defibrillator) present    Anxiety    Aortic insufficiency    mild to moderate by echo 02/2021   Arthritis    Ascending aorta dilatation (HCC)    4.5cm by echo 02/2021 and 3.6 cm by chest CTA   CAD (coronary artery disease)    a. s/p stent x2 and then  CABG 1996.   Cancer of kidney ([place])    Kidney    Cardiomyopathy, ischemic    Chronic combined systolic (congestive) and diastolic (congestive) heart failure ([place]) 07/07/2017   Depression    Dilated aortic root (HCC)    Dyslipidemia    Dyspnea    with exertion   Erectile dysfunction    Fatty liver 11/04/2019   GERD (gastroesophageal reflux disease)    Headache    Hypertension    Kidney cancer, primary, with metastasis from kidney to other site [facility])    Orthostasis    Pneumonia    Restless legs    not bothered by this anymore   Rotator cuff tear    S/P CABG (coronary artery bypass graft)    Ventricular tachycardia [facility])     Past Surgical History:  Procedure Laterality Date   ABDOMINAL AORTOGRAM W/LOWER EXTREMITY N/A 12/17/2021   Procedure: ABDOMINAL AORTOGRAM W/LOWER EXTREMITY;  Surgeon: Lorretta Harp, MD;  Location: [place] [facility];  Service: Cardiovascular;  Laterality: N/A;   CARDIAC CATHETERIZATION     Cardiac stents     prior to 2005- 3 times total   COLONOSCOPY     CORONARY ARTERY BYPASS GRAFT  2005   CORONARY STENT PLACEMENT     ICD LEAD REMOVAL N/A 10/02/2016   Procedure: ICD LEAD REMOVAL WITH IMPLANTATION OF BIOTRONIK PLEXA PROMRI DF-1 S DX 65/17 SN [id number];  Surgeon: Evans Lance, MD;  Location: [place];  Service: Cardiovascular;  Laterality: N/A;  [place] to back up   [place]  12-07-2012   BTK single chamber ICD implanted by Dr Lovena Le   IMPLANTABLE CARDIOVERTER DEFIBRILLATOR IMPLANT N/A 12/07/2012   Procedure: IMPLANTABLE CARDIOVERTER DEFIBRILLATOR IMPLANT;  Surgeon: Evans Lance, MD;  Location: [facility] CATH LAB;  Service: Cardiovascular;  Laterality: N/A;   Kidney resection     small amount of kidney   PERIPHERAL VASCULAR INTERVENTION Right 12/17/2021   Procedure: PERIPHERAL VASCULAR INTERVENTION;  Surgeon: Lorretta Harp, MD;  Location: [place] [facility];  Service: Cardiovascular;  Laterality: Right;  external iliac    ROTATOR CUFF REPAIR     TEE WITHOUT CARDIOVERSION N/A 10/02/2016   Procedure: TRANSESOPHAGEAL ECHOCARDIOGRAM (TEE);  Surgeon: Evans Lance, MD;  Location: [facility] OR;  Service: Cardiovascular;  Laterality: N/A;    Social History:   reports that he has been smoking cigars. He has never used smokeless tobacco. He reports that he does not drink alcohol and does not use drugs.  No Known Allergies  Family History  Problem Relation Age of Onset   Diabetes Mother    Cancer Father    Suicidality Brother      Prior to Admission medications   Medication Sig Start Date End Date Taking? Authorizing Provider  atorvastatin (LIPITOR) 80 MG tablet TAKE 1 TABLET BY MOUTH EVERY DAY 02/21/22   Sueanne Margarita, MD  Buprenorphine HCl-Naloxone HCl 8-2 MG FILM Place 0.5-1 Film under the tongue See admin instructions. Take 0.5 film in the morning, 0.5 film at 1300, and 1 film at supper 02/24/20   [provider]  Calcium-Magnesium-Zinc (CAL-MAG-ZINC PO) Take 1 capsule by mouth daily.    [provider]  Cholecalciferol (VITAMIN D3) 50 MCG (2000 UT) TABS Take 2,000 Units by mouth daily.    [provider]  clonazePAM (KLONOPIN) 1 MG tablet Take 1-3 mg by mouth See admin instructions. Take 1 mg in the morning and 3 mg at night 09/23/12   [provider]  clopidogrel (PLAVIX) 75 MG tablet Take 1 tablet (75 mg total) by mouth daily with breakfast. 12/18/21   Almyra Deforest, PA  lamoTRIgine (LAMICTAL) 100 MG tablet Take 100 mg by mouth 2 (two) times daily. 10/21/19   [provider]  lisinopril (ZESTRIL) 5 MG tablet Take 1 tablet (5 mg total) by mouth daily. 02/21/22   Sueanne Margarita, MD  metoprolol succinate (TOPROL-XL) 50 MG 24 hr tablet TAKE 1 TABLET BY MOUTH DAILY. TAKE WITH OR IMMEDIATELY FOLLOWING A MEAL. 02/21/22   Turner, Eber Hong, MD  naphazoline-pheniramine (NAPHCON-A) 0.025-0.3 % ophthalmic solution Place 2 drops into both eyes in the morning.    [provider]   nitroGLYCERIN (NITROSTAT) 0.4 MG SL tablet Place 1 tablet (0.4 mg total) under the tongue every 5 (five) minutes as needed for chest pain (MAX 3 TABLETS). 11/13/18   Evans Lance, MD  pantoprazole (PROTONIX) 40 MG tablet Take 1 tablet (40 mg total) by mouth daily. 12/18/21 06/16/22  Almyra Deforest, PA  venlafaxine XR (EFFEXOR-XR) 75 MG 24 hr capsule Take 75 mg by mouth 2 (two) times daily.    [provider]  vitamin B-12 (CYANOCOBALAMIN) 500 MCG tablet Take 500 mcg by mouth daily.    [provider]    Physical Exam: Vitals:   04/08/22 2045 04/08/22 2115 04/08/22 2145 04/08/22 2215  BP: 133/77 118/73 122/81 133/87  Pulse: 82 92 88   Resp: 12 14 (!) 8 (!) 7  Temp:  TempSrc:      SpO2: 100% 100% 100%      Constitutional: NAD, cachectic  Eyes: PERTLA, lids and conjunctivae normal ENMT: Mucous membranes are dry. Posterior pharynx clear of any exudate or lesions.   Neck: supple, no masses  Respiratory: no wheezing, no crackles. No accessory muscle use.  Cardiovascular: S1 & S2 heard, regular rate and rhythm. No extremity edema.   Abdomen: No distension, soft, no rebound pain or guarding. Bowel sounds active.  Musculoskeletal: no clubbing / cyanosis. No joint deformity upper and lower extremities.   Skin: Ecchymoses. Poor turgor. Neurologic: CN 2-12 grossly intact. Moving all extremities. Alert and oriented to person, place, and situation.  Psychiatric: Calm. Cooperative.    Labs and Imaging on Admission: I have personally reviewed following labs and imaging studies  CBC: Recent Labs  Lab 04/08/22 2117  WBC 8.7  NEUTROABS 7.0  HGB 13.7  HCT 39.8  MCV 92.1  PLT [id number]   Basic Metabolic Panel: Recent Labs  Lab 04/08/22 2117  NA 136  K 4.3  CL 96*  CO2 25  GLUCOSE 117*  BUN 49*  CREATININE 1.14  CALCIUM 9.4   GFR: CrCl cannot be calculated (Unknown ideal weight.). Liver Function Tests: Recent Labs  Lab 04/08/22 2117  AST 34  ALT 26  ALKPHOS 76   BILITOT 1.7*  PROT 6.0*  ALBUMIN 3.4*   No results for input(s): "LIPASE", "AMYLASE" in the last 168 hours. Recent Labs  Lab 04/08/22 2117  AMMONIA 25   Coagulation Profile: No results for input(s): "INR", "PROTIME" in the last 168 hours. Cardiac Enzymes: No results for input(s): "CKTOTAL", "CKMB", "CKMBINDEX", "TROPONINI" in the last 168 hours. BNP (last 3 results) No results for input(s): "PROBNP" in the last 8760 hours. HbA1C: No results for input(s): "HGBA1C" in the last 72 hours. CBG: No results for input(s): "GLUCAP" in the last 168 hours. Lipid Profile: No results for input(s): "CHOL", "HDL", "LDLCALC", "TRIG", "CHOLHDL", "LDLDIRECT" in the last 72 hours. Thyroid Function Tests: No results for input(s): "TSH", "T4TOTAL", "FREET4", "T3FREE", "THYROIDAB" in the last 72 hours. Anemia Panel: No results for input(s): "VITAMINB12", "FOLATE", "FERRITIN", "TIBC", "IRON", "RETICCTPCT" in the last 72 hours. Urine analysis:    Component Value Date/Time   COLORURINE AMBER (A) 07/05/2016 1522   APPEARANCEUR HAZY (A) 07/05/2016 1522   LABSPEC 1.031 (H) 07/05/2016 1522   PHURINE 5.0 07/05/2016 1522   GLUCOSEU NEGATIVE 07/05/2016 1522   HGBUR NEGATIVE 07/05/2016 1522   BILIRUBINUR moderate (A) 07/19/2016 1325   KETONESUR small (15) (A) 07/19/2016 1325   KETONESUR 5 (A) 07/05/2016 1522   PROTEINUR =100 (A) 07/19/2016 1325   PROTEINUR 30 (A) 07/05/2016 1522   UROBILINOGEN 4.0 (A) 07/19/2016 1325   UROBILINOGEN 1.0 11/22/2012 2025   NITRITE Negative 07/19/2016 1325   NITRITE NEGATIVE 07/05/2016 1522   LEUKOCYTESUR Negative 07/19/2016 1325   Sepsis Labs: @LABRCNTIP (procalcitonin:4,lacticidven:4) )No results found for this or any previous visit (from the past 240 hour(s)).   Radiological Exams on Admission: CT Head Wo Contrast  Result Date: 04/08/2022 CLINICAL DATA:  Head trauma, minor. Generalized weakness. Multiple falls. EXAM: CT HEAD WITHOUT CONTRAST TECHNIQUE: Contiguous  axial images were obtained from the base of the skull through the vertex without intravenous contrast. RADIATION DOSE REDUCTION: This exam was performed according to the departmental dose-optimization program which includes automated exposure control, adjustment of the mA and/or kV according to patient size and/or use of iterative reconstruction technique. COMPARISON:  Head CT 08/14/2020. FINDINGS: Brain: No acute intracranial  hemorrhage. Mild chronic small-vessel disease. 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 No hydrocephalus or extra-axial collection. No mass effect or midline shift. Vascular: No hyperdense vessel or unexpected calcification. Skull: No calvarial fracture or suspicious bone lesion. Skull base is unremarkable. Sinuses/Orbits: Unremarkable. Other: None. IMPRESSION: 1. No acute intracranial hemorrhage. 2. 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 3. Mild chronic small-vessel disease. Electronically Signed   By: Emmit Alexanders M.D.   On: 04/08/2022 18:22   DG Chest 2 View  Result Date: 04/08/2022 CLINICAL DATA:  Generalized weakness. Multiple falls. Infection evaluation. EXAM: CHEST - 2 VIEW COMPARISON:  Chest CT 03/08/2021.  Radiographs 08/14/2020. FINDINGS: The heart size and mediastinal contours are stable status post median sternotomy. Left subclavian AICD lead projects over the right ventricular apex. The lungs appear hyperinflated but clear. There is no pleural effusion or pneumothorax. No acute osseous findings are seen. There are advanced degenerative changes at both shoulders. Chronic fracture of the left humeral neck may be incompletely healed. Grossly stable chronic compression deformities near the thoracolumbar junction. IMPRESSION: No evidence of acute cardiopulmonary process. Chronic obstructive pulmonary disease. Electronically Signed   By: Richardean Sale M.D.   On: 04/08/2022 18:17    EKG: Independently reviewed. Sinus rhythm,  LVH, QTc 570.   Assessment/Plan   1. Failure to thrive  - Check CT abd/pelvis given months of abdominal pain with nausea and diarrhea, check TSH, B12, folate, and phosphorus, consult PT and dietary    2. Left thalamic infarction  - Age-indeterminate left thalamic infarct noted on CT in ED   - Neurology recommended MRI  - Check MRI, continue statin and Plavix   3. CAD; elevated troponin  - Troponin was 110 and then 102 in ED without chest pain or acute ischemic EKG changes  - Appreciate cardiology recommendations, will trend EKGs, check echocardiogram     4. Chronic combined systolic & diastolic CHF  - Appears hypovolemic on admission  - Monitor closely while hydrating with IVF, continue beta-blocker     DVT prophylaxis: Lovenox  Code Status: Full  Level of Care: Level of care: Telemetry Medical Family Communication: none present  Disposition Plan:  Patient is from: Home  Anticipated d/c is to: TBD Anticipated d/c date is: 04/11/22  Patient currently: Pending MRI brain, echocardiogram, therapy assessments  Consults called: Cardiology   Admission status: Inpatient     Vianne Bulls, MD [facility]  04/08/2022, 11:09 PM

## 2022-04-08 NOTE — ED Notes (Signed)
Failed attempt to collect labs   

## 2022-04-08 NOTE — ED Triage Notes (Signed)
Pt to ED via EMS from home with generalized weakness. Per EMS pt lives alone and has been having multiple falls a week. Pt neighbor found him and called EMS. Pt arrives to ED Aox4 in triage with hematoma to left side of head. Pt has no other obvious injuries. Pt is pale and cachexic.

## 2022-04-08 NOTE — ED Notes (Signed)
ED TO INPATIENT HANDOFF REPORT  ED Nurse Name and Phone #: Albina Billet 24  S Name/Age/Gender Hector Byrd 67 y.o. male Room/Bed: 003C/003C  Code Status   Code Status: Full Code  Home/SNF/Other To be determined  Patient oriented to: self Is this baseline? No   Triage Complete: Triage complete  Chief Complaint Failure to thrive in adult [R62.7]  Triage Note Pt to ED via EMS from home with generalized weakness. Per EMS pt lives alone and has been having multiple falls a week. Pt neighbor found him and called EMS. Pt arrives to ED Aox4 in triage with hematoma to left side of head. Pt has no other obvious injuries. Pt is pale and cachexic.    Allergies No Known Allergies  Level of Care/Admitting Diagnosis ED Disposition   ED Disposition: Admit Condition: None Comment: Hospital Area: [place] [[id number]]  Level of Care: Telemetry Medical [104]  May admit patient to [facility] or [facility] if equivalent level of care is available:: No  Covid Evaluation: Asymptomatic - no recent exposure (last 10 days) testing not required  Diagnosis: Failure to thrive in adult [358490]  Admitting Physician: Vianne Bulls [id number]  Attending Physician: Vianne Bulls [id number]  Certification:: I certify this patient will need inpatient services for at least 2 midnights  Estimated Length of Stay: 3      B Medical/Surgery History Past Medical History:  Diagnosis Date   AAA (abdominal aortic aneurysm) ([place])    3.0cm by duplex 12/2019   AICD (automatic cardioverter/defibrillator) present    Anxiety    Aortic insufficiency    mild to moderate by echo 02/2021   Arthritis    Ascending aorta dilatation ([place])    4.5cm by echo 02/2021 and 3.6 cm by chest CTA   CAD (coronary artery disease)    a. s/p stent x2 and then CABG 1996.   Cancer of kidney ([place])    Kidney    Cardiomyopathy, ischemic    Chronic combined systolic (congestive) and diastolic (congestive) heart  failure ([place]) 07/07/2017   Depression    Dilated aortic root ([facility])    Dyslipidemia    Dyspnea    with exertion   Erectile dysfunction    Fatty liver 11/04/2019   GERD (gastroesophageal reflux disease)    Headache    Hypertension    Kidney cancer, primary, with metastasis from kidney to other site [facility])    Orthostasis    Pneumonia    Restless legs    not bothered by this anymore   Rotator cuff tear    S/P CABG (coronary artery bypass graft)    Ventricular tachycardia [facility])    Past Surgical History:  Procedure Laterality Date   ABDOMINAL AORTOGRAM W/LOWER EXTREMITY N/A 12/17/2021   Procedure: ABDOMINAL AORTOGRAM W/LOWER EXTREMITY;  Surgeon: Lorretta Harp, MD;  Location: [place] [facility];  Service: Cardiovascular;  Laterality: N/A;   CARDIAC CATHETERIZATION     Cardiac stents     prior to 2005- 3 times total   COLONOSCOPY     CORONARY ARTERY BYPASS GRAFT  2005   CORONARY STENT PLACEMENT     ICD LEAD REMOVAL N/A 10/02/2016   Procedure: ICD LEAD REMOVAL WITH IMPLANTATION OF BIOTRONIK PLEXA PROMRI DF-1 S DX 65/17 SN [id number];  Surgeon: Evans Lance, MD;  Location: [facility];  Service: Cardiovascular;  Laterality: N/A;  [place] to back up   [place]  12-07-2012   BTK single chamber ICD implanted by  Dr Lovena Le   IMPLANTABLE CARDIOVERTER DEFIBRILLATOR IMPLANT N/A 12/07/2012   Procedure: IMPLANTABLE CARDIOVERTER DEFIBRILLATOR IMPLANT;  Surgeon: Evans Lance, MD;  Location: [facility] CATH LAB;  Service: Cardiovascular;  Laterality: N/A;   Kidney resection     small amount of kidney   PERIPHERAL VASCULAR INTERVENTION Right 12/17/2021   Procedure: PERIPHERAL VASCULAR INTERVENTION;  Surgeon: Lorretta Harp, MD;  Location: [place] [facility];  Service: Cardiovascular;  Laterality: Right;  external iliac   ROTATOR CUFF REPAIR     TEE WITHOUT CARDIOVERSION N/A 10/02/2016   Procedure: TRANSESOPHAGEAL ECHOCARDIOGRAM (TEE);  Surgeon: Evans Lance, MD;   Location: [facility] OR;  Service: Cardiovascular;  Laterality: N/A;     A IV Location/Drains/Wounds Patient Lines/Drains/Airways Status     Active Line/Drains/Airways     Name Placement date Placement time Site Days   Peripheral IV 04/08/22 22 G Left Antecubital 04/08/22  1755  Antecubital  less than 1            Intake/Output Last 24 hours No intake or output data in the 24 hours ending 04/08/22 2345  Labs/Imaging Results for orders placed or performed during the hospital encounter of 04/08/22 (from the past 48 hour(s))  Comprehensive metabolic panel     Status: Abnormal   Collection Time: 04/08/22  9:17 PM  Result Value Ref Range   Sodium 136 135 - 145 mmol/L   Potassium 4.3 3.5 - 5.1 mmol/L   Chloride 96 (L) 98 - 111 mmol/L   CO2 25 22 - 32 mmol/L   Glucose, Bld 117 (H) 70 - 99 mg/dL    Comment: Glucose reference range applies only to samples taken after fasting for at least 8 hours.   BUN 49 (H) 8 - 23 mg/dL   Creatinine, Ser 1.14 0.61 - 1.24 mg/dL   Calcium 9.4 8.9 - 10.3 mg/dL   Total Protein 6.0 (L) 6.5 - 8.1 g/dL   Albumin 3.4 (L) 3.5 - 5.0 g/dL   AST 34 15 - 41 U/L   ALT 26 0 - 44 U/L   Alkaline Phosphatase 76 38 - 126 U/L   Total Bilirubin 1.7 (H) 0.3 - 1.2 mg/dL   GFR, Estimated >60 >60 mL/min    Comment: (NOTE) Calculated using the CKD-EPI Creatinine Equation (2021)    Anion gap 15 5 - 15    Comment: Performed at [facility], [place] [address] [place], [place] [address]  CBC with Differential     Status: None   Collection Time: 04/08/22  9:17 PM  Result Value Ref Range   WBC 8.7 4.0 - 10.5 K/uL   RBC 4.32 4.22 - 5.81 MIL/uL   Hemoglobin 13.7 13.0 - 17.0 g/dL   HCT 39.8 39.0 - 52.0 %   MCV 92.1 80.0 - 100.0 fL   MCH 31.7 26.0 - 34.0 pg   MCHC 34.4 30.0 - 36.0 g/dL   RDW 15.3 11.5 - 15.5 %   Platelets 235 150 - 400 K/uL   nRBC 0.0 0.0 - 0.2 %   Neutrophils Relative % 82 %   Neutro Abs 7.0 1.7 - 7.7 K/uL   Lymphocytes Relative 10 %   Lymphs Abs  0.9 0.7 - 4.0 K/uL   Monocytes Relative 8 %   Monocytes Absolute 0.7 0.1 - 1.0 K/uL   Eosinophils Relative 0 %   Eosinophils Absolute 0.0 0.0 - 0.5 K/uL   Basophils Relative 0 %   Basophils Absolute 0.0 0.0 - 0.1 K/uL  Immature Granulocytes 0 %   Abs Immature Granulocytes 0.03 0.00 - 0.07 K/uL    Comment: Performed at [facility], [place] [address] [place], [place] [address]  Troponin I (High Sensitivity)     Status: Abnormal   Collection Time: 04/08/22  9:17 PM  Result Value Ref Range   Troponin I (High Sensitivity) 110 (HH) <18 ng/L    Comment: CRITICAL RESULT CALLED TO, READ BACK BY AND VERIFIED WITH J.JACKSON,RN. 2232 04/08/22. LPAIT (NOTE) Elevated high sensitivity troponin I (hsTnI) values and significant  changes across serial measurements may suggest ACS but many other  chronic and acute conditions are known to elevate hsTnI results.  Refer to the "Links" section for chest pain algorithms and additional  guidance. Performed at [facility], [place] [address] [place], [place] [address]   Ammonia     Status: None   Collection Time: 04/08/22  9:17 PM  Result Value Ref Range   Ammonia 25 9 - 35 umol/L    Comment: HEMOLYSIS AT THIS LEVEL MAY AFFECT RESULT Performed at [facility], [place] [address] [place], [place] [address]   Ethanol     Status: None   Collection Time: 04/08/22  9:17 PM  Result Value Ref Range   Alcohol, Ethyl (B) <10 <10 mg/dL    Comment: (NOTE) Lowest detectable limit for serum alcohol is 10 mg/dL.  For medical purposes only. Performed at [facility], [place] [address] [place], [place] [address]    CT Head Wo Contrast  Result Date: 04/08/2022 CLINICAL DATA:  Head trauma, minor. Generalized weakness. Multiple falls. EXAM: CT HEAD WITHOUT CONTRAST TECHNIQUE: Contiguous axial images were obtained from the base of the skull through the vertex without intravenous contrast. RADIATION DOSE REDUCTION: This exam was performed according to  the departmental dose-optimization program which includes automated exposure control, adjustment of the mA and/or kV according to patient size and/or use of iterative reconstruction technique. COMPARISON:  Head CT 08/14/2020. FINDINGS: Brain: No acute intracranial hemorrhage. Mild chronic small-vessel disease. 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 No hydrocephalus or extra-axial collection. No mass effect or midline shift. Vascular: No hyperdense vessel or unexpected calcification. Skull: No calvarial fracture or suspicious bone lesion. Skull base is unremarkable. Sinuses/Orbits: Unremarkable. Other: None. IMPRESSION: 1. No acute intracranial hemorrhage. 2. 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 3. Mild chronic small-vessel disease. Electronically Signed   By: Emmit Alexanders M.D.   On: 04/08/2022 18:22   DG Chest 2 View  Result Date: 04/08/2022 CLINICAL DATA:  Generalized weakness. Multiple falls. Infection evaluation. EXAM: CHEST - 2 VIEW COMPARISON:  Chest CT 03/08/2021.  Radiographs 08/14/2020. FINDINGS: The heart size and mediastinal contours are stable status post median sternotomy. Left subclavian AICD lead projects over the right ventricular apex. The lungs appear hyperinflated but clear. There is no pleural effusion or pneumothorax. No acute osseous findings are seen. There are advanced degenerative changes at both shoulders. Chronic fracture of the left humeral neck may be incompletely healed. Grossly stable chronic compression deformities near the thoracolumbar junction. IMPRESSION: No evidence of acute cardiopulmonary process. Chronic obstructive pulmonary disease. Electronically Signed   By: Richardean Sale M.D.   On: 04/08/2022 18:17    Pending Labs Unresulted Labs (From admission, onward)     Start     Ordered   04/15/22 0500  Creatinine, serum  (enoxaparin (LOVENOX)    CrCl >/= 30 ml/min)  Weekly,   R  Comments: while on enoxaparin  therapy    04/08/22 2309   04/09/22 0500  HIV Antibody (routine testing w rflx)  (HIV Antibody (Routine testing w reflex) panel)  Tomorrow morning,   R        04/08/22 2309   04/09/22 0500  Lipid panel  (Labs)  Tomorrow morning,   R       Comments: Fasting    04/08/22 2309   04/09/22 0500  Hemoglobin A1c  (Labs)  Tomorrow morning,   R       Comments: To assess prior glycemic control    04/08/22 2309   04/09/22 0500  Comprehensive metabolic panel  (Labs)  Daily,   R      04/08/22 2309   04/09/22 0500  CBC  Daily,   R      04/08/22 2309   04/09/22 0500  CK  Tomorrow morning,   R        04/08/22 2312   04/09/22 0500  TSH  Tomorrow morning,   R        04/08/22 2312   04/09/22 0500  Vitamin B12  Tomorrow morning,   R        04/08/22 2312   04/08/22 1732  Rapid urine drug screen (hospital performed)  ONCE - STAT,   STAT        04/08/22 1731   04/08/22 1732  Urinalysis, Routine w reflex microscopic -Urine, Clean Catch  Once,   URGENT       Question:  Specimen Source  Answer:  Urine, Clean Catch   04/08/22 1731            Vitals/Pain Today's Vitals   04/08/22 2045 04/08/22 2115 04/08/22 2145 04/08/22 2215  BP: 133/77 118/73 122/81 133/87  Pulse: 82 92 88   Resp: 12 14 (Abnormal) 8 (Abnormal) 7  Temp:      TempSrc:      SpO2: 100% 100% 100%   PainSc:        Isolation Precautions No active isolations  Medications Medications  atorvastatin (LIPITOR) tablet 80 mg (has no administration in time range)  metoprolol succinate (TOPROL-XL) 24 hr tablet 50 mg (has no administration in time range)  pantoprazole (PROTONIX) EC tablet 40 mg (has no administration in time range)  clopidogrel (PLAVIX) tablet 75 mg (has no administration in time range)  lamoTRIgine (LAMICTAL) tablet 100 mg (has no administration in time range)   stroke: early stages of recovery book (has no administration in time range)  0.9 %  sodium chloride infusion (has no administration in time range)   acetaminophen (TYLENOL) tablet 650 mg (has no administration in time range)    Or  acetaminophen (TYLENOL) 160 MG/5ML solution 650 mg (has no administration in time range)    Or  acetaminophen (TYLENOL) suppository 650 mg (has no administration in time range)  enoxaparin (LOVENOX) injection 40 mg (has no administration in time range)    Mobility walks with person assist     Focused Assessments Neuro Assessment Handoff:  Swallow screen pass?  N/A         Neuro Assessment: Exceptions to [facility] Neuro Checks:      Has TPA been given? No If patient is a Neuro Trauma and patient is going to OR before floor call report to [place] nurse: [phone] or [phone]  , Skin    R Recommendations: See Admitting Provider Note  Report given to:   Additional Notes:  Patient most likely needs placement.  Lots of bruising on him from falls.

## 2022-04-09 ENCOUNTER — Inpatient Hospital Stay (HOSPITAL_COMMUNITY): Payer: Medicare Other

## 2022-04-09 DIAGNOSIS — R7989 Other specified abnormal findings of blood chemistry: Secondary | ICD-10-CM | POA: Diagnosis not present

## 2022-04-09 DIAGNOSIS — R918 Other nonspecific abnormal finding of lung field: Secondary | ICD-10-CM

## 2022-04-09 DIAGNOSIS — C259 Malignant neoplasm of pancreas, unspecified: Secondary | ICD-10-CM

## 2022-04-09 DIAGNOSIS — K8689 Other specified diseases of pancreas: Secondary | ICD-10-CM | POA: Diagnosis not present

## 2022-04-09 DIAGNOSIS — Z87891 Personal history of nicotine dependence: Secondary | ICD-10-CM

## 2022-04-09 DIAGNOSIS — R634 Abnormal weight loss: Secondary | ICD-10-CM

## 2022-04-09 DIAGNOSIS — R296 Repeated falls: Secondary | ICD-10-CM

## 2022-04-09 DIAGNOSIS — I6389 Other cerebral infarction: Secondary | ICD-10-CM

## 2022-04-09 DIAGNOSIS — I472 Ventricular tachycardia, unspecified: Secondary | ICD-10-CM

## 2022-04-09 DIAGNOSIS — R627 Adult failure to thrive: Secondary | ICD-10-CM | POA: Diagnosis not present

## 2022-04-09 DIAGNOSIS — R109 Unspecified abdominal pain: Secondary | ICD-10-CM

## 2022-04-09 LAB — ECHOCARDIOGRAM COMPLETE
AR max vel: 2.69 cm2
AV Area VTI: 2.47 cm2
AV Area mean vel: 2.53 cm2
AV Mean grad: 2.5 mmHg
AV Peak grad: 5 mmHg
Ao pk vel: 1.12 m/s
Height: 67 in
S' Lateral: 3.6 cm
Weight: 1322.76 oz

## 2022-04-09 LAB — COMPREHENSIVE METABOLIC PANEL
ALT: UNDETERMINED U/L (ref 0–44)
AST: UNDETERMINED U/L (ref 15–41)
Albumin: 3.6 g/dL (ref 3.5–5.0)
Alkaline Phosphatase: 85 U/L (ref 38–126)
Anion gap: 16 — ABNORMAL HIGH (ref 5–15)
BUN: 50 mg/dL — ABNORMAL HIGH (ref 8–23)
CO2: 23 mmol/L (ref 22–32)
Calcium: 9.4 mg/dL (ref 8.9–10.3)
Chloride: 99 mmol/L (ref 98–111)
Creatinine, Ser: 1.07 mg/dL (ref 0.61–1.24)
GFR, Estimated: >60 mL/min (ref >60)
Glucose, Bld: 103 mg/dL — ABNORMAL HIGH (ref 70–99)
Potassium: 4 mmol/L (ref 3.5–5.1)
Sodium: 138 mmol/L (ref 135–145)
Total Bilirubin: UNDETERMINED mg/dL (ref 0.3–1.2)
Total Protein: 6.5 g/dL (ref 6.5–8.1)

## 2022-04-09 LAB — CBC
HCT: 38.2 % — ABNORMAL LOW (ref 39.0–52.0)
Hemoglobin: 13.1 g/dL (ref 13.0–17.0)
MCH: 31.5 pg (ref 26.0–34.0)
MCHC: 34.3 g/dL (ref 30.0–36.0)
MCV: 91.8 fL (ref 80.0–100.0)
Platelets: 231 10*3/uL (ref 150–400)
RBC: 4.16 MIL/uL — ABNORMAL LOW (ref 4.22–5.81)
RDW: 15.4 % (ref 11.5–15.5)
WBC: 8.6 10*3/uL (ref 4.0–10.5)
nRBC: 0 % (ref 0.0–0.2)

## 2022-04-09 LAB — HIV ANTIBODY (ROUTINE TESTING W REFLEX): HIV Screen 4th Generation wRfx: NONREACTIVE

## 2022-04-09 LAB — LIPID PANEL
Cholesterol: 264 mg/dL — ABNORMAL HIGH (ref 0–200)
HDL: 68 mg/dL (ref >40)
LDL Cholesterol: 159 mg/dL — ABNORMAL HIGH (ref 0–99)
Total CHOL/HDL Ratio: 3.9 RATIO
Triglycerides: 185 mg/dL — ABNORMAL HIGH (ref <150)
VLDL: 37 mg/dL (ref 0–40)

## 2022-04-09 LAB — TSH: TSH: 2.943 u[IU]/mL (ref 0.350–4.500)

## 2022-04-09 LAB — HEPARIN LEVEL (UNFRACTIONATED): Heparin Unfractionated: 0.21 IU/mL — ABNORMAL LOW (ref 0.30–0.70)

## 2022-04-09 LAB — CK: Total CK: 140 U/L (ref 49–397)

## 2022-04-09 LAB — TROPONIN I (HIGH SENSITIVITY): Troponin I (High Sensitivity): 102 ng/L (ref <18)

## 2022-04-09 LAB — FOLATE: Folate: 5.8 ng/mL — ABNORMAL LOW (ref >5.9)

## 2022-04-09 LAB — VITAMIN B12: Vitamin B-12: 2275 pg/mL — ABNORMAL HIGH (ref 180–914)

## 2022-04-09 LAB — PHOSPHORUS: Phosphorus: 3.6 mg/dL (ref 2.5–4.6)

## 2022-04-09 MED ORDER — MELATONIN 3 MG PO TABS
3.0000 mg | ORAL_TABLET | Freq: Every evening | ORAL | Status: DC | PRN
  Administered 2022-04-09 – 2022-04-10 (×2): 3 mg via ORAL
  Filled 2022-04-09 (×2): qty 1

## 2022-04-09 MED ORDER — THIAMINE MONONITRATE 100 MG PO TABS
100.0000 mg | ORAL_TABLET | Freq: Every day | ORAL | Status: DC
  Administered 2022-04-09 – 2022-04-13 (×5): 100 mg via ORAL
  Filled 2022-04-09 (×5): qty 1

## 2022-04-09 MED ORDER — IOHEXOL 350 MG/ML SOLN
75.0000 mL | Freq: Once | INTRAVENOUS | Status: AC | PRN
  Administered 2022-04-09: 75 mL via INTRAVENOUS

## 2022-04-09 MED ORDER — ENSURE ENLIVE PO LIQD
237.0000 mL | Freq: Three times a day (TID) | ORAL | Status: DC
  Administered 2022-04-09 – 2022-04-12 (×10): 237 mL via ORAL

## 2022-04-09 MED ORDER — HEPARIN (PORCINE) 25000 UT/250ML-% IV SOLN
600.0000 [IU]/h | INTRAVENOUS | Status: DC
  Administered 2022-04-09: 500 [IU]/h via INTRAVENOUS
  Filled 2022-04-09: qty 250

## 2022-04-09 MED ORDER — LACTATED RINGERS IV SOLN: INTRAVENOUS | Status: AC

## 2022-04-09 MED ORDER — MEDIHONEY WOUND/BURN DRESSING EX PSTE
1.0000 | PASTE | Freq: Every day | CUTANEOUS | Status: DC
  Administered 2022-04-09 – 2022-04-12 (×4): 1 via TOPICAL
  Filled 2022-04-09: qty 44

## 2022-04-09 MED ORDER — LACTATED RINGERS IV SOLN: INTRAVENOUS | Status: DC

## 2022-04-09 MED ORDER — ENOXAPARIN SODIUM 300 MG/3ML IJ SOLN
20.0000 mg | INTRAMUSCULAR | Status: DC
  Administered 2022-04-09: 20 mg via SUBCUTANEOUS
  Filled 2022-04-09: qty 0.2

## 2022-04-09 MED ORDER — ADULT MULTIVITAMIN W/MINERALS CH
1.0000 | ORAL_TABLET | Freq: Every day | ORAL | Status: DC
  Administered 2022-04-09 – 2022-04-13 (×5): 1 via ORAL
  Filled 2022-04-09 (×5): qty 1

## 2022-04-09 MED ORDER — CLOPIDOGREL BISULFATE 75 MG PO TABS
75.0000 mg | ORAL_TABLET | Freq: Every day | ORAL | Status: DC
  Administered 2022-04-09: 75 mg via ORAL
  Filled 2022-04-09: qty 1

## 2022-04-09 MED ORDER — HYDROCODONE-ACETAMINOPHEN 5-325 MG PO TABS
1.0000 | ORAL_TABLET | Freq: Four times a day (QID) | ORAL | Status: DC | PRN
  Administered 2022-04-09: 1 via ORAL
  Filled 2022-04-09: qty 1

## 2022-04-09 NOTE — Progress Notes (Signed)
Per central monitoring patient had a run of [place]. Xu,MD made aware

## 2022-04-09 NOTE — Social Work (Signed)
CSW met with pt at bedside and attempted to complete assessment and discuss PT rec. Pt states "I'm tired, I just got here" and defers conversation until later. TOC will continue to follow for DC planning.

## 2022-04-09 NOTE — Consult Note (Signed)
Cardiology Consultation   Patient ID: Hector Byrd MRN: [id number]; DOB: 07-22-1955  Admit date: 04/08/2022 Date of Consult: 04/09/2022  PCP:  Orma Render, NP   [place] [facility] Cardiologist:  Fransico Him, MD  Electrophysiologist:  Cristopher Peru, MD       Patient Profile:   Hector Byrd is a 67 y.o. male with a hx of CAD status post CABG (1996), dyslipidemia, ischemic cardiomyopathy with chronic systolic HF (EF A999333 in 02/2021) with Biotronik AICD in situ (impanted 12/07/2012) , HTN, PAD, cardiomyopathy with ICD, ascending aortic dilatation, and recent abdominal pain, loss of appetite, and weight loss presents to the emergency department with progressive generalized weakness and multiple falls. Cardiology is consulted given his cardiac history and troponin elevation presenting labs.    History of Present Illness:   Hector Byrd presents for evaluation of weakness and falls. While he does have an extensive cardiac history, the patient denies any active chest pain. Hector Byrd describes past episodes of chest pain when he had his CABG and states that he is not experiencing anything like that at this time. He further denies any exertional chest pressure/discomfort, dyspnea/tachypnea, paroxysmal nocturnal dyspnea/orthopnea, irregular heart beat/palpitations, presyncope/syncope, lower extremity edema, claudication, abdominal distention, or recent ICD discharges.    Past Medical History:  Diagnosis Date   AAA (abdominal aortic aneurysm) ([place])    3.0cm by duplex 12/2019   AICD (automatic cardioverter/defibrillator) present    Anxiety    Aortic insufficiency    mild to moderate by echo 02/2021   Arthritis    Ascending aorta dilatation (HCC)    4.5cm by echo 02/2021 and 3.6 cm by chest CTA   CAD (coronary artery disease)    a. s/p stent x2 and then CABG 1996.   Cancer of kidney ([place])    Kidney    Cardiomyopathy, ischemic    Chronic combined systolic (congestive) and  diastolic (congestive) heart failure ([place]) 07/07/2017   Depression    Dilated aortic root (HCC)    Dyslipidemia    Dyspnea    with exertion   Erectile dysfunction    Fatty liver 11/04/2019   GERD (gastroesophageal reflux disease)    Headache    Hypertension    Kidney cancer, primary, with metastasis from kidney to other site [facility])    Orthostasis    Pneumonia    Restless legs    not bothered by this anymore   Rotator cuff tear    S/P CABG (coronary artery bypass graft)    Ventricular tachycardia [facility])     Past Surgical History:  Procedure Laterality Date   ABDOMINAL AORTOGRAM W/LOWER EXTREMITY N/A 12/17/2021   Procedure: ABDOMINAL AORTOGRAM W/LOWER EXTREMITY;  Surgeon: Lorretta Harp, MD;  Location: [place] [facility];  Service: Cardiovascular;  Laterality: N/A;   CARDIAC CATHETERIZATION     Cardiac stents     prior to 2005- 3 times total   COLONOSCOPY     CORONARY ARTERY BYPASS GRAFT  2005   CORONARY STENT PLACEMENT     ICD LEAD REMOVAL N/A 10/02/2016   Procedure: ICD LEAD REMOVAL WITH IMPLANTATION OF BIOTRONIK PLEXA PROMRI DF-1 S DX 65/17 SN [id number];  Surgeon: Evans Lance, MD;  Location: [place];  Service: Cardiovascular;  Laterality: N/A;  [place] to back up   Register  12-07-2012   BTK single chamber ICD implanted by Dr Lovena Le   IMPLANTABLE CARDIOVERTER DEFIBRILLATOR IMPLANT N/A 12/07/2012   Procedure: IMPLANTABLE CARDIOVERTER DEFIBRILLATOR IMPLANT;  Surgeon:  Evans Lance, MD;  Location: [facility] CATH LAB;  Service: Cardiovascular;  Laterality: N/A;   Kidney resection     small amount of kidney   PERIPHERAL VASCULAR INTERVENTION Right 12/17/2021   Procedure: PERIPHERAL VASCULAR INTERVENTION;  Surgeon: Lorretta Harp, MD;  Location: [place] [facility];  Service: Cardiovascular;  Laterality: Right;  external iliac   ROTATOR CUFF REPAIR     TEE WITHOUT CARDIOVERSION N/A 10/02/2016   Procedure: TRANSESOPHAGEAL ECHOCARDIOGRAM (TEE);   Surgeon: Evans Lance, MD;  Location[facility] OR;  Service: Cardiovascular;  Laterality: N/A;     Home Medications:  Prior to Admission medications   Medication Sig Start Date End Date Taking? Authorizing Provider  atorvastatin (LIPITOR) 80 MG tablet TAKE 1 TABLET BY MOUTH EVERY DAY 02/21/22   Sueanne Margarita, MD  Buprenorphine HCl-Naloxone HCl 8-2 MG FILM Place 0.5-1 Film under the tongue See admin instructions. Take 0.5 film in the morning, 0.5 film at 1300, and 1 film at supper 02/24/20   [provider]  Calcium-Magnesium-Zinc (CAL-MAG-ZINC PO) Take 1 capsule by mouth daily.    [provider]  Cholecalciferol (VITAMIN D3) 50 MCG (2000 UT) TABS Take 2,000 Units by mouth daily.    [provider]  clonazePAM (KLONOPIN) 1 MG tablet Take 1-3 mg by mouth See admin instructions. Take 1 mg in the morning and 3 mg at night 09/23/12   [provider]  clopidogrel (PLAVIX) 75 MG tablet Take 1 tablet (75 mg total) by mouth daily with breakfast. 12/18/21   Almyra Deforest, PA  lamoTRIgine (LAMICTAL) 100 MG tablet Take 100 mg by mouth 2 (two) times daily. 10/21/19   [provider]  lisinopril (ZESTRIL) 5 MG tablet Take 1 tablet (5 mg total) by mouth daily. 02/21/22   Sueanne Margarita, MD  metoprolol succinate (TOPROL-XL) 50 MG 24 hr tablet TAKE 1 TABLET BY MOUTH DAILY. TAKE WITH OR IMMEDIATELY FOLLOWING A MEAL. 02/21/22   Turner, Eber Hong, MD  naphazoline-pheniramine (NAPHCON-A) 0.025-0.3 % ophthalmic solution Place 2 drops into both eyes in the morning.    [provider]  nitroGLYCERIN (NITROSTAT) 0.4 MG SL tablet Place 1 tablet (0.4 mg total) under the tongue every 5 (five) minutes as needed for chest pain (MAX 3 TABLETS). 11/13/18   Evans Lance, MD  pantoprazole (PROTONIX) 40 MG tablet Take 1 tablet (40 mg total) by mouth daily. 12/18/21 06/16/22  Almyra Deforest, PA  venlafaxine XR (EFFEXOR-XR) 75 MG 24 hr capsule Take 75 mg by mouth 2 (two) times daily.    [provider]  vitamin B-12 (CYANOCOBALAMIN) 500 MCG tablet Take 500 mcg by mouth daily.    [provider]    Inpatient Medications: Scheduled Meds:   stroke: early stages of recovery book   Does not apply Once   atorvastatin  80 mg Oral Daily   clopidogrel  75 mg Oral Daily   enoxaparin (LOVENOX) injection  40 mg Subcutaneous Q24H   lamoTRIgine  100 mg Oral BID   metoprolol succinate  50 mg Oral Daily   pantoprazole  40 mg Oral Daily   Continuous Infusions:  sodium chloride 100 mL/hr at 04/09/22 0012   PRN Meds: acetaminophen **OR** acetaminophen (TYLENOL) oral liquid 160 mg/5 mL **OR** acetaminophen, HYDROcodone-acetaminophen, melatonin  Allergies:   No Known Allergies  Social History:   Social History   Socioeconomic History   Marital status: Single    Spouse name: Not on file   Number of children: Not  on file   Years of education: Not on file   Highest education level: Not on file  Occupational History   Not on file  Tobacco Use   Smoking status: Every Day    Types: Cigars   Smokeless tobacco: Never   Tobacco comments:    uses cigarelle- 4 per day  Vaping Use   Vaping Use: Never used  Substance and Sexual Activity   Alcohol use: No    Comment: HISTORY OF DRUG & ETOH 09/19/2004   Drug use: No    Comment: none since 09/19/2004   Sexual activity: Never  Other Topics Concern   Not on file  Social History Narrative   Not on file   Social Determinants of Health   Financial Resource Strain: Not on file  Food Insecurity: Not on file  Transportation Needs: Not on file  Physical Activity: Not on file  Stress: Not on file  Social Connections: Not on file  Intimate Partner Violence: Not on file    Family History:    Family History  Problem Relation Age of Onset   Diabetes Mother    Cancer Father    Suicidality Brother      ROS:  Please see the history of present illness.   All other ROS reviewed and negative.     Physical Exam/Data:    Vitals:   04/08/22 2045 04/08/22 2115 04/08/22 2145 04/08/22 2215  BP: 133/77 118/73 122/81 133/87  Pulse: 82 92 88   Resp: 12 14 (!) 8 (!) 7  Temp:      TempSrc:      SpO2: 100% 100% 100%    No intake or output data in the 24 hours ending 04/09/22 0019    03/19/2022   11:32 AM 01/09/2022    2:23 PM 12/17/2021    1:49 PM  Last 3 Weights  Weight (lbs) 95 lb 0.6 oz 115 lb 9.6 oz 124 lb  Weight (kg) 43.109 kg 52.436 kg 56.246 kg     There is no height or weight on file to calculate BMI.  General:  Mild distress, severely cachetic and appears chronically unwell HEENT: Large hematoma overlying left frontal bone Neck: no JVD Cardiac:  normal S1, S2; RRR; no m/r/g appreciated Lungs:  clear to auscultation anteriorly Abd: soft, nontender Ext: no edema, chronic wound overlying lateral aspect of right leg Skin: warm and dry  Neuro:  No focal abnormalities noted  EKG:  The EKG was personally reviewed and demonstrates:  sinus rhythm with no dynamic ST changes  Relevant CV Studies: None  Laboratory Data:  High Sensitivity Troponin:   Recent Labs  Lab 04/08/22 2117  TROPONINIHS 110*     Chemistry Recent Labs  Lab 04/08/22 2117  NA 136  K 4.3  CL 96*  CO2 25  GLUCOSE 117*  BUN 49*  CREATININE 1.14  CALCIUM 9.4  GFRNONAA >60  ANIONGAP 15    Recent Labs  Lab 04/08/22 2117  PROT 6.0*  ALBUMIN 3.4*  AST 34  ALT 26  ALKPHOS 76  BILITOT 1.7*   Lipids No results for input(s): "CHOL", "TRIG", "HDL", "LABVLDL", "LDLCALC", "CHOLHDL" in the last 168 hours.  Hematology Recent Labs  Lab 04/08/22 2117  WBC 8.7  RBC 4.32  HGB 13.7  HCT 39.8  MCV 92.1  MCH 31.7  MCHC 34.4  RDW 15.3  PLT 235   Thyroid No results for input(s): "TSH", "FREET4" in the last 168 hours.  BNPNo results for input(s): "BNP", "PROBNP"  in the last 168 hours.  DDimer No results for input(s): "DDIMER" in the last 168 hours.   Radiology/Studies:  CT Head Wo Contrast  Result Date:  04/08/2022 CLINICAL DATA:  Head trauma, minor. Generalized weakness. Multiple falls. EXAM: CT HEAD WITHOUT CONTRAST TECHNIQUE: Contiguous axial images were obtained from the base of the skull through the vertex without intravenous contrast. RADIATION DOSE REDUCTION: This exam was performed according to the departmental dose-optimization program which includes automated exposure control, adjustment of the mA and/or kV according to patient size and/or use of iterative reconstruction technique. COMPARISON:  Head CT 08/14/2020. FINDINGS: Brain: No acute intracranial hemorrhage. Mild chronic small-vessel disease. 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 No hydrocephalus or extra-axial collection. No mass effect or midline shift. Vascular: No hyperdense vessel or unexpected calcification. Skull: No calvarial fracture or suspicious bone lesion. Skull base is unremarkable. Sinuses/Orbits: Unremarkable. Other: None. IMPRESSION: 1. No acute intracranial hemorrhage. 2. 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 3. Mild chronic small-vessel disease. Electronically Signed   By: Emmit Alexanders M.D.   On: 04/08/2022 18:22   DG Chest 2 View  Result Date: 04/08/2022 CLINICAL DATA:  Generalized weakness. Multiple falls. Infection evaluation. EXAM: CHEST - 2 VIEW COMPARISON:  Chest CT 03/08/2021.  Radiographs 08/14/2020. FINDINGS: The heart size and mediastinal contours are stable status post median sternotomy. Left subclavian AICD lead projects over the right ventricular apex. The lungs appear hyperinflated but clear. There is no pleural effusion or pneumothorax. No acute osseous findings are seen. There are advanced degenerative changes at both shoulders. Chronic fracture of the left humeral neck may be incompletely healed. Grossly stable chronic compression deformities near the thoracolumbar junction. IMPRESSION: No evidence of acute cardiopulmonary process. Chronic obstructive  pulmonary disease. Electronically Signed   By: Richardean Sale M.D.   On: 04/08/2022 18:17     Assessment and Plan:   Hector Byrd is a 67 y.o. male with a hx of CAD status post CABG (1996), dyslipidemia, ischemic cardiomyopathy with chronic systolic HF (EF [id number] in 02/2021) with Biotronik AICD in situ (impanted 12/07/2012) , HTN, PAD, cardiomyopathy with ICD, ascending aortic dilatation, and recent abdominal pain, loss of appetite, and weight loss presents to the emergency department with progressive generalized weakness and multiple falls. Cardiology is consulted given his cardiac history and troponin elevation presenting labs.    # History of CAD with prior CABG # ICM with ICD in situ # Troponin elevation :: Unclear etiology but given the clinical picture, strong suspicion that this is demand related with no evidence of ACS at this time.  Recent remote device interrogation (03/14/22) without evidence of ventricular or atrial arrhythmias that could explain frequent falls - No indication for heparin at this time, but would trend trop and serial ECGs - CT head with no evidence of intracranial hemorrhage, but would hold off on continuing plavix for now - Agree with TTE in AM - Agree with evaluation of underlying failure to thrive - Recent remote device interrogation without evidence of ventricular or atrial arrhythmias that could explain frequent falls - Aggressive risk factor modification when out of the acute setting   Risk Assessment/Risk Scores:                For questions or updates, please contact [place] Please consult [email or web address] for contact info under    Signed, Clois Dupes, MD  04/09/2022 12:19 AM

## 2022-04-09 NOTE — Progress Notes (Signed)
LR stopped and patient [place] locked for CT

## 2022-04-09 NOTE — Progress Notes (Signed)
ANTICOAGULATION CONSULT NOTE  Pharmacy Consult for heparin  Indication:  suspected PE   No Known Allergies  Patient Measurements: Height: 5\' 7"  (170.2 cm) Weight: 37.5 kg (82 lb 10.8 oz) IBW/kg (Calculated) : 66.1 Heparin Dosing Weight: 40.8kg   Vital Signs: Temp: 97.7 F (36.5 C) (03/26 1936) Temp Source: Oral (03/26 1936) BP: 123/69 (03/26 1936) Pulse Rate: 72 (03/26 1936)  Labs: Recent Labs    04/08/22 2117 04/08/22 2349 04/09/22 0426 04/09/22 2135  HGB 13.7  --  13.1  --   HCT 39.8  --  38.2*  --   PLT 235  --  231  --   HEPARINUNFRC  --   --   --  0.21*  CREATININE 1.14 1.07  --   --   CKTOTAL  --  140  --   --   TROPONINIHS 110* 102*  --   --      Estimated Creatinine Clearance: 34.6 mL/min (by C-G formula based on SCr of 1.07 mg/dL).   Medical History: Past Medical History:  Diagnosis Date   AAA (abdominal aortic aneurysm) ([place])    3.0cm by duplex 12/2019   AICD (automatic cardioverter/defibrillator) present    Anxiety    Aortic insufficiency    mild to moderate by echo 02/2021   Arthritis    Ascending aorta dilatation (HCC)    4.5cm by echo 02/2021 and 3.6 cm by chest CTA   CAD (coronary artery disease)    a. s/p stent x2 and then CABG 1996.   Cancer of kidney ([place])    Kidney    Cardiomyopathy, ischemic    Chronic combined systolic (congestive) and diastolic (congestive) heart failure ([place]) 07/07/2017   Depression    Dilated aortic root (HCC)    Dyslipidemia    Dyspnea    with exertion   Erectile dysfunction    Fatty liver 11/04/2019   GERD (gastroesophageal reflux disease)    Headache    Hypertension    Kidney cancer, primary, with metastasis from kidney to other site [facility])    Orthostasis    Pneumonia    Restless legs    not bothered by this anymore   Rotator cuff tear    S/P CABG (coronary artery bypass graft)    Ventricular tachycardia ([place])     Assessment: Patient admitted with CC of weakness and multiple falls. Found to have  age-indeterminate infarct in left thalamic. MRI recommended by neurology (not yet completed). CTA negative for PE. Not anticoag PTA, currently on low dose enoxaparin for DVT PPX given weight last dose on 3/26 @ 10:00. Pharmacy consulted to dose heparin.   Heparin level came back slightly subtherapeutic at 0.21, on 500 units/hr. No s/sx of bleeding or infusion issues.   Goal of Therapy:  Anti-Xa 0.3-0.5 for now pending MRI results  Monitor platelets by anticoagulation protocol: Yes   Plan:  No bolus given potential for acute infarct.  Increase heparin infusion to 600 u/hr.  Check anti-Xa in 6 hours, suspect CrCl underestimating renal function due to low body weight.  Monitor H & H.  F/u MRI results, adjust heparin rate/goal accordingly.   Antonietta Jewel, PharmD, [place] Clinical Pharmacist  Phone: [phone] 04/09/2022 10:24 PM  Please check AMION for all [place] phone numbers After 10:00 PM, call [place] [phone]
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
## 2022-04-09 NOTE — Progress Notes (Signed)
Patient on box 13 Telemetry box verified

## 2022-04-09 NOTE — Evaluation (Signed)
Occupational Therapy Evaluation Patient Details Name: Hector Byrd MRN: [id number] DOB: June 26, 1955 Today's Date: 04/09/2022   History of Present Illness 67 y.o. male admitted 3/25 with progressive generalized weakness and multiple falls, Failure to thrive. PMHX: depression, CAD, PAD, cardiomyopathy with ICD, ascending aortic dilatation, and recent abdominal pain, loss of appetite, and weight loss.   Clinical Impression   Pt,presents with decline in function and safety with ADLs and ADL mobility with impaired strength, balance and endurance. PTA pt lived at home alone and reports that he was Ind with ADLs/selfcare, home mgt and mobility with no AD although he reports falls. Pt currently required mod A with LB ADLs, min A with mobility using RW  with significant generalized weakness demonstrated. Pt would benefit from acute OT services to address impairments to maximize level of function and safety     Recommendations for follow up therapy are one component of a multi-disciplinary discharge planning process, led by the attending physician.  Recommendations may be updated based on patient status, additional functional criteria and insurance authorization.   Assistance Recommended at Discharge Frequent or constant Supervision/Assistance  Patient can return home with the following A little help with walking and/or transfers;A lot of help with bathing/dressing/bathroom;Assistance with cooking/housework;Assist for transportation    Functional Status Assessment  Patient has had a recent decline in their functional status and demonstrates the ability to make significant improvements in function in a reasonable and predictable amount of time.  Equipment Recommendations  Other (comment) (TBD at SNF)    Recommendations for Other Services       Precautions / Restrictions Precautions Precautions: Fall Restrictions Weight Bearing Restrictions: No      Mobility Bed Mobility Overal bed mobility:  Needs Assistance             General bed mobility comments: pt in recliner upon arrival    Transfers Overall transfer level: Needs assistance Equipment used: Rolling walker (2 wheels) Transfers: Sit to/from Stand, Bed to chair/wheelchair/BSC Sit to Stand: Min assist     Step pivot transfers: Min assist     General transfer comment: cues for hand placement, min A to BSC, min A to guide RW      Balance Overall balance assessment: Needs assistance Sitting-balance support: No upper extremity supported, Feet supported Sitting balance-Leahy Scale: Fair     Standing balance support: Reliant on assistive device for balance, Bilateral upper extremity supported, During functional activity Standing balance-Leahy Scale: Poor                             ADL either performed or assessed with clinical judgement   ADL Overall ADL's : Needs assistance/impaired Eating/Feeding: Independent   Grooming: Wash/dry hands;Wash/dry face;Min guard;Standing   Upper Body Bathing: Min guard;Sitting   Lower Body Bathing: Moderate assistance   Upper Body Dressing : Min guard;Sitting   Lower Body Dressing: Moderate assistance   Toilet Transfer: Minimal assistance;Rolling walker (2 wheels);BSC/3in1;Stand-pivot   Toileting- Clothing Manipulation and Hygiene: Minimal assistance;Sit to/from stand;Cueing for safety       Functional mobility during ADLs: Minimal assistance;Rolling walker (2 wheels);Cueing for safety       Vision Ability to See in Adequate Light: 0 Adequate Patient Visual Report: No change from baseline       Perception     Praxis      Pertinent Vitals/Pain Pain Assessment Pain Assessment: Faces Faces Pain Scale: Hurts little more Pain Location: bladder area Pain Descriptors /  Indicators: Sore Pain Intervention(s): Monitored during session, Repositioned     Hand Dominance Right   Extremity/Trunk Assessment Upper Extremity Assessment Upper Extremity  Assessment: Generalized weakness   Lower Extremity Assessment Lower Extremity Assessment: Defer to PT evaluation       Communication Communication Communication: No difficulties   Cognition Arousal/Alertness: Awake/alert Behavior During Therapy: Flat affect Overall Cognitive Status: Impaired/Different from baseline Area of Impairment: Orientation, Safety/judgement, Problem solving                 Orientation Level: Disoriented to, Place, Time, Situation       Safety/Judgement: Decreased awareness of safety   Problem Solving: Slow processing, Decreased initiation, Difficulty sequencing, Requires verbal cues       General Comments       Exercises     Shoulder Instructions      Home Living Family/patient expects to be discharged to:: Unsure Living Arrangements: Alone Available Help at Discharge: Neighbor;Available PRN/intermittently Type of Home: Apartment Home Access: Stairs to enter Entrance Stairs-Number of Steps: 13 Entrance Stairs-Rails: Right;Left Home Layout: One level     Bathroom Shower/Tub: Tub/shower unit         Home Equipment: None          Prior Functioning/Environment Prior Level of Function : Independent/Modified Independent;History of Falls (last six months)             Mobility Comments: reports Ind with mobility, no AD but falling ADLs Comments: reports Ind with ADLs/selfcare, light home mgt        OT Problem List: Decreased strength;Decreased activity tolerance;Impaired balance (sitting and/or standing);Decreased knowledge of use of DME or AE      OT Treatment/Interventions: Self-care/ADL training;DME and/or AE instruction;Therapeutic exercise;Therapeutic activities;Patient/family education    OT Goals(Current goals can be found in the care plan section) Acute Rehab OT Goals Patient Stated Goal: get better OT Goal Formulation: With patient Time For Goal Achievement: 04/23/22 Potential to Achieve Goals: Good ADL  Goals Pt Will Perform Grooming: with supervision;with set-up;sitting Pt Will Perform Upper Body Bathing: with supervision;with set-up;sitting Pt Will Perform Lower Body Bathing: with min assist Pt Will Perform Upper Body Dressing: with supervision;with set-up;sitting Pt Will Transfer to Toilet: with min guard assist;ambulating;regular height toilet;grab bars;bedside commode Pt Will Perform Toileting - Clothing Manipulation and hygiene: with min guard assist;sit to/from stand  OT Frequency: Min 2X/week    Co-evaluation              AM-PAC OT "6 Clicks" Daily Activity     Outcome Measure Help from another person eating meals?: None Help from another person taking care of personal grooming?: A Little Help from another person toileting, which includes using toliet, bedpan, or urinal?: A Little Help from another person bathing (including washing, rinsing, drying)?: A Lot Help from another person to put on and taking off regular upper body clothing?: A Little Help from another person to put on and taking off regular lower body clothing?: A Lot 6 Click Score: 17   End of Session Equipment Utilized During Treatment: Gait belt;Rolling walker (2 wheels);Other (comment) (BSC)  Activity Tolerance: Patient limited by fatigue Patient left: in bed;with call bell/phone within reach;with bed alarm set  OT Visit Diagnosis: Unsteadiness on feet (R26.81);Other abnormalities of gait and mobility (R26.89);Muscle weakness (generalized) (M62.81)                Time: 1119-1140 OT Time Calculation (min): 21 min Charges:  OT General Charges $OT Visit: 1 Visit OT Evaluation $  OT Eval Moderate Complexity: 1 Mod    Britt Bottom 04/09/2022, 3:02 PM

## 2022-04-09 NOTE — Progress Notes (Signed)
PROGRESS NOTE    Hector Byrd  [id number] DOB: 1955-01-19 DOA: 04/08/2022 PCP: Orma Render, NP     Brief Narrative:    depression, CAD, PAD, cardiomyopathy with ICD, ascending aortic dilatation, and recent abdominal pain, loss of appetite, and weight loss presents to the emergency department with progressive generalized weakness and multiple falls.   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 Case discussed with oncology Dr Benay Spice who will see patient in consult  Chest pain with tachyardia, mild elevation of troponin Seen by cardiology , currently on Lipitor, Plavix, Lopressor  echo result pending Get CTA chest to rule out PE also finish staging scan, start heparin drip for now  Chronic systolic chf Echo pending Does not appear volume overloaded Received gentle hydration Close monitor volume status Cardiology following   Left thalamic infarction Not able to do MRI due to history of ICD Repeat CT head  H/o PVD, chronic right lower extremity wound Seen by wound care  Significant weight loss /FTT/poor prognosis Palliative care consulted for goals of care    Nutritional Assessment: The patient's BMI is: Body mass index is 12.95 kg/m.Marland Kitchen Seen by dietician.  I agree with the assessment and plan as outlined below: Nutrition Status: Nutrition Problem: Inadequate oral intake Etiology: acute illness, chronic illness (FTT) Signs/Symptoms: other (comment), percent weight loss (BMI  12.96, per chart review) Percent weight loss: 38.4 % Interventions: Ensure Enlive (each supplement provides 350kcal and 20 grams of protein), MVI  .        Skin Assessment: I have examined the patient's skin and I agree with the wound assessment as performed by the wound care RN as outlined below: Pressure Injury 04/08/22 Pretibial Right Large scabbed wound on outside of right leg. (Active)  04/08/22 2100  Location: Pretibial  Location Orientation: Right  Staging:   Wound Description (Comments): Large scabbed wound on outside of right leg.  Present on Admission: Yes     Pressure Injury Heel Left Stage 1 -  Intact skin with non-blanchable redness of a localized area usually over a bony prominence. (Active)     Location: Heel  Location Orientation: Left  Staging: Stage 1 -  Intact skin with non-blanchable redness of a localized area usually over a bony prominence.  Wound Description (Comments):   Present on Admission: Yes     Pressure Injury 04/09/22 Vertebral column Lower;Mid Unstageable - Full thickness tissue loss in which the base of the injury is covered by slough (yellow, tan, gray, green or brown) and/or eschar (tan, brown or black) in the wound bed. 0.5 x 0.5 100% yel (Active)  04/09/22 1100  Location: Vertebral column  Location Orientation: Lower;Mid  Staging: Unstageable - Full thickness tissue loss in which the base of the injury is covered by slough (yellow, tan, gray, green or brown) and/or eschar (tan, brown or black) in the wound bed.  Wound Description (Comments): 0.5 x 0.5 100% yellow slough  Present on Admission: Yes  Dressing Type Honey 04/09/22 0800   Seen  [facility] wound care  I have Reviewed nursing notes, Vitals, pain scores, I/o's, Lab results and  imaging results since pt's last encounter, details please see discussion above  I ordered the following labs:  Unresulted Labs (From admission, onward)     Start     Ordered   04/15/22 0500  Creatinine, serum   (enoxaparin (LOVENOX)    CrCl >/= 30 ml/min)  Weekly,   R     Comments: while on enoxaparin therapy    04/08/22 2309   04/10/22 0500  CEA  Tomorrow morning,   R        04/09/22 1354   04/10/22 0500  Cancer antigen 19-9  Tomorrow morning,   R        04/09/22 1354   04/10/22 0500  Magnesium  Tomorrow morning,   R        04/09/22 1354   04/09/22 0500  Hemoglobin A1c  (Labs)  Tomorrow morning,   R       Comments: To assess prior glycemic control    04/08/22 2309   04/09/22 0500  Comprehensive metabolic panel  (Labs)  Daily,   R      04/08/22 2309   04/09/22 0500  CBC  Daily,   R      04/08/22 2309   04/08/22 1732  Rapid urine drug screen (hospital performed)  ONCE - STAT,   STAT        04/08/22 1731   04/08/22 1732  Urinalysis, Routine w reflex microscopic -Urine, Clean Catch  Once,   URGENT       Question:  Specimen Source  Answer:  Urine, Clean Catch   04/08/22 1731             DVT prophylaxis: lovneox then heparin drip in case of pe   Code Status:   Code Status: Full Code  Family Communication: sister over the phone Disposition:   Dispo: The patient is from: home, lives by himself, next of kin is sister              Anticipated d/c is to: TBD              Anticipated d/c date is: >24hrs  Antimicrobials:    Anti-infectives (From admission, onward)    None          Objective: Vitals:   04/09/22 0558 04/09/22 0805 04/09/22 1256 04/09/22 1351  BP: (!) 143/81 124/82 123/81   Pulse: 86 92 (!) 117   Resp: 16 17 19    Temp: (!) 97.5 F (36.4 C) 97.8 F (36.6 C) (!) 97.4 F (36.3 C)   TempSrc: Oral Oral Oral   SpO2:  100% (!) 84% 100%  Weight:      Height:       No intake or output data in the 24 hours ending 04/09/22 1411 Filed Weights   04/09/22 0028 04/09/22 0232  Weight: 40.8 kg 37.5 kg    Examination:  General exam: very fail, weak, cachectic, oriented x3 Respiratory system: Clear to auscultation. Respiratory effort normal. Cardiovascular  system:  sinus tachycardia  Gastrointestinal system: Abdomen is nondistended, soft and nontender.  Normal bowel sounds heard. Central nervous system: Alert and oriented. No focal neurological deficits. Extremities:  right leg wound now wrapped,  Skin: right leg wound, unstageable pressure injury low back above sacrum Psychiatry: flat affect    Data Reviewed: I have personally reviewed  labs and visualized  imaging studies since the last encounter and formulate  the plan        Scheduled Meds:  atorvastatin  80 mg Oral Daily   clopidogrel  75 mg Oral Daily   enoxaparin (LOVENOX) injection  20 mg Subcutaneous Q24H   feeding supplement  237 mL Oral TID BM   lamoTRIgine  100 mg Oral BID   leptospermum manuka honey  1 Application Topical Daily   metoprolol succinate  50 mg Oral Daily   multivitamin with minerals  1 tablet Oral Daily   pantoprazole  40 mg Oral Daily   thiamine  100 mg Oral Daily   Continuous Infusions:  sodium chloride 100 mL/hr at 04/09/22 0012     LOS: 1 day   Time spent:  71mins  Florencia Reasons, MD PhD FACP [facility]  Available via Epic secure chat 7am-7pm for nonurgent issues Please page for urgent issues To page the attending provider between 7A-7P or the covering provider during after hours 7P-7A, please log into the web site [email or web address] and access using universal [place] password for that web site. If you do not have the password, please call the hospital operator.    04/09/2022, 2:11 PM

## 2022-04-09 NOTE — Evaluation (Signed)
Physical Therapy Evaluation Patient Details Name: Hector Byrd MRN: [id number] DOB: Nov 04, 1955 Today's Date: 04/09/2022  History of Present Illness  67 y.o. male admitted 3/25 with progressive generalized weakness and multiple falls, Failure to thrive. PMHX: depression, CAD, PAD, cardiomyopathy with ICD, ascending aortic dilatation, and recent abdominal pain, loss of appetite, and weight loss.  Clinical Impression  Pt admitted with above diagnosis. Lives alone, previously independent, community ambulator. Complains of recent falls and weakness. Currently requires min assist for bed mobility, assist for hygiene and pericare with frequent bowel incontinence. Min assist for sit<>stand and transfers out of bed.  Pt currently with functional limitations due to the deficits listed below (see PT Problem List). Pt will benefit from acute skilled PT to increase their independence and safety with mobility to allow discharge.  Patient will benefit from continued inpatient follow up therapy, <3 hours/day         Recommendations for follow up therapy are one component of a multi-disciplinary discharge planning process, led by the attending physician.  Recommendations may be updated based on patient status, additional functional criteria and insurance authorization.  Follow Up Recommendations Can patient physically be transported by private vehicle: Yes     Assistance Recommended at Discharge Frequent or constant Supervision/Assistance  Patient can return home with the following  A little help with walking and/or transfers;A lot of help with bathing/dressing/bathroom;Assistance with cooking/housework;Assist for transportation;Help with stairs or ramp for entrance    Equipment Recommendations Rolling walker (2 wheels);BSC/3in1  Recommendations for Other Services       Functional Status Assessment Patient has had a recent decline in their functional status and demonstrates the ability to make significant  improvements in function in a reasonable and predictable amount of time.     Precautions / Restrictions Precautions Precautions: Fall Restrictions Weight Bearing Restrictions: No      Mobility  Bed Mobility Overal bed mobility: Needs Assistance Bed Mobility: Rolling, Sidelying to Sit Rolling: Min guard Sidelying to sit: Min guard       General bed mobility comments: Min guard for safety, rolled towards left and right during pericare, able to use rails with cues. Min guard to rise to EOB from sidelying, slow but without physical assist today, very effortful.    Transfers Overall transfer level: Needs assistance Equipment used: Rolling walker (2 wheels) Transfers: Sit to/from Stand, Bed to chair/wheelchair/BSC Sit to Stand: Min assist   Step pivot transfers: Min assist       General transfer comment: Min assist for boost to stand with cues for hand placement, performed from bed and BSC. Min assist for RW control and balance with step pivot transfer to [facility] and lateral steps to recliner after toileting.    Ambulation/Gait                  Stairs            Wheelchair Mobility    Modified Rankin (Stroke Patients Only)       Balance Overall balance assessment: Needs assistance Sitting-balance support: No upper extremity supported, Feet supported Sitting balance-Leahy Scale: Fair     Standing balance support: Reliant on assistive device for balance, Bilateral upper extremity supported Standing balance-Leahy Scale: Poor                               Pertinent Vitals/Pain Pain Assessment Pain Assessment: Faces Faces Pain Scale: Hurts little more Pain Location: bladder area Pain Descriptors /  Indicators: Sore Pain Intervention(s): Monitored during session, Repositioned    Home Living Family/patient expects to be discharged to:: Unsure Living Arrangements: Alone Available Help at Discharge: Neighbor;Available PRN/intermittently Type  of Home: Apartment Home Access: Stairs to enter Entrance Stairs-Rails: Right;Left Entrance Stairs-Number of Steps: 13   Home Layout: One level Home Equipment: None      Prior Function Prior Level of Function : Independent/Modified Independent;History of Falls (last six months) (walked to store for groceries)             Mobility Comments: reports ind but falling, not using device ADLs Comments: states independent     Hand Dominance   Dominant Hand: Right    Extremity/Trunk Assessment   Upper Extremity Assessment Upper Extremity Assessment: Defer to OT evaluation    Lower Extremity Assessment Lower Extremity Assessment: Generalized weakness;Difficult to assess due to impaired cognition       Communication   Communication: No difficulties  Cognition Arousal/Alertness: Awake/alert Behavior During Therapy: WFL for tasks assessed/performed Overall Cognitive Status: Impaired/Different from baseline Area of Impairment: Orientation, Safety/judgement, Problem solving                 Orientation Level: Disoriented to, Place, Time, Situation (Aware of month, provides wrong year, "2022, 2026" Eventually guesses correct location after several incorrect guesses.)       Safety/Judgement: Decreased awareness of safety   Problem Solving: Slow processing, Decreased initiation, Difficulty sequencing, Requires verbal cues          General Comments General comments (skin integrity, edema, etc.): Pt incontinent of loose stool. Assisted with pericare.    Exercises     Assessment/Plan    PT Assessment Patient needs continued PT services  PT Problem List Decreased strength;Decreased activity tolerance;Decreased balance;Decreased mobility;Decreased knowledge of use of DME;Decreased cognition;Decreased safety awareness;Decreased knowledge of precautions;Cardiopulmonary status limiting activity;Decreased skin integrity;Pain       PT Treatment Interventions DME  instruction;Gait training;Functional mobility training;Therapeutic activities;Therapeutic exercise;Balance training;Neuromuscular re-education;Patient/family education    PT Goals (Current goals can be found in the Care Plan section)  Acute Rehab PT Goals Patient Stated Goal: Get well PT Goal Formulation: With patient Time For Goal Achievement: 04/23/22 Potential to Achieve Goals: Good    Frequency Min 3X/week     Co-evaluation               AM-PAC PT "6 Clicks" Mobility  Outcome Measure Help needed turning from your back to your side while in a flat bed without using bedrails?: A Little Help needed moving from lying on your back to sitting on the side of a flat bed without using bedrails?: A Little Help needed moving to and from a bed to a chair (including a wheelchair)?: A Little Help needed standing up from a chair using your arms (e.g., wheelchair or bedside chair)?: A Little Help needed to walk in hospital room?: A Little Help needed climbing 3-5 steps with a railing? : A Lot 6 Click Score: 17    End of Session   Activity Tolerance: Patient tolerated treatment well Patient left: in chair;with call bell/phone within reach;with chair alarm set Nurse Communication: Mobility status PT Visit Diagnosis: Unsteadiness on feet (R26.81);Repeated falls (R29.6);Muscle weakness (generalized) (M62.81);History of falling (Z91.81);Difficulty in walking, not elsewhere classified (R26.2);Adult, failure to thrive (R62.7);Pain Pain - Right/Left:  (central) Pain - part of body:  (bladder area)    Time: [id number] PT Time Calculation (min) (ACUTE ONLY): 63 min   Charges:   PT Evaluation $PT Eval Low  Complexity: 1 Low PT Treatments $Therapeutic Activity: 23-37 mins $Self Care/Home Management: [place], PT, DPT Physical Therapist Acute Rehabilitation Services [facility]   Ellouise Newer 04/09/2022, 10:47 AM
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
## 2022-04-09 NOTE — Progress Notes (Signed)
   04/09/22 1256  Assess: MEWS Score  Temp (!) 97.4 F (36.3 C)  BP 123/81  MAP (mmHg) 93  Pulse Rate (!) 117  Resp 19  Level of Consciousness Alert  SpO2 (!) 84 %  O2 Device Room Air  Assess: MEWS Score  MEWS Temp 0  MEWS Systolic 0  MEWS Pulse 2  MEWS RR 0  MEWS LOC 0  MEWS Score 2  MEWS Score Color Yellow  Notify: Charge Nurse/RN  Name of Charge Nurse/RN Notified Jill,RN  Provider Notification  Provider Name/Title Tawni Levy (xu, fang,MD)  Date Provider Notified 04/09/22  Time Provider Notified 1300  Method of Notification Page  Notification Reason Other (Comment) (yellow MEWS)  Provider response Other (Comment) (waiting orders)  Assess: SIRS CRITERIA  SIRS Temperature  0  SIRS Pulse 1  SIRS Respirations  0  SIRS WBC 0  SIRS Score Sum  1

## 2022-04-09 NOTE — Progress Notes (Signed)
ANTICOAGULATION CONSULT NOTE - Initial Consult  Pharmacy Consult for heparin  Indication:  suspected PE   No Known Allergies  Patient Measurements: Height: 5\' 7"  (170.2 cm) Weight: 37.5 kg (82 lb 10.8 oz) IBW/kg (Calculated) : 66.1 Heparin Dosing Weight: 40.8kg   Vital Signs: Temp: 97.4 F (36.3 C) (03/26 1340) Temp Source: Oral (03/26 1256) BP: 123/81 (03/26 1258) Pulse Rate: 73 (03/26 1415)  Labs: Recent Labs    04/08/22 2117 04/08/22 2349 04/09/22 0426  HGB 13.7  --  13.1  HCT 39.8  --  38.2*  PLT 235  --  231  CREATININE 1.14 1.07  --   CKTOTAL  --  140  --   TROPONINIHS 110* 102*  --     Estimated Creatinine Clearance: 34.6 mL/min (by C-G formula based on SCr of 1.07 mg/dL).   Medical History: Past Medical History:  Diagnosis Date   AAA (abdominal aortic aneurysm) ([place])    3.0cm by duplex 12/2019   AICD (automatic cardioverter/defibrillator) present    Anxiety    Aortic insufficiency    mild to moderate by echo 02/2021   Arthritis    Ascending aorta dilatation (HCC)    4.5cm by echo 02/2021 and 3.6 cm by chest CTA   CAD (coronary artery disease)    a. s/p stent x2 and then CABG 1996.   Cancer of kidney ([place])    Kidney    Cardiomyopathy, ischemic    Chronic combined systolic (congestive) and diastolic (congestive) heart failure ([place]) 07/07/2017   Depression    Dilated aortic root (HCC)    Dyslipidemia    Dyspnea    with exertion   Erectile dysfunction    Fatty liver 11/04/2019   GERD (gastroesophageal reflux disease)    Headache    Hypertension    Kidney cancer, primary, with metastasis from kidney to other site [facility])    Orthostasis    Pneumonia    Restless legs    not bothered by this anymore   Rotator cuff tear    S/P CABG (coronary artery bypass graft)    Ventricular tachycardia ([place])     Assessment: Patient admitted with CC of weakness and multiple falls. Found to have age-indeterminate infarct in left thalamic. MRI recommended by  neurology (not yet completed). New concern for PE, currently on room air. CBC stable, CTA currently ordered. Not anticoag PTA, currently on low dose enoxaparin for DVT PPX given weight last dose on 3/26 @ 10:00. Pharmacy consulted to dose heparin.   Goal of Therapy:  Anti-Xa 0.3-0.5 for now pending MRI results  Monitor platelets by anticoagulation protocol: Yes   Plan:  No bolus given potential for acute infarct.  Start heparin 500u/hr.  Check anti-Xa in 6 hours, suspect CrCl underestimating renal function due to low body weight.  Monitor H & H.  F/u MRI results, adjust heparin rate/goal accordingly.  F/u CTA results   Esmeralda Arthur, PharmD, BCCCP  04/09/2022,2:42 PM

## 2022-04-09 NOTE — Consult Note (Addendum)
[place] Nurse Consult Note: patient with chronic wound  to right lower leg; history of PAD, last ABI noted 11/2021 0.83 R 1.05 L; patient had stenting in December 2023 d/t claudication of R leg; due for next ABI studies 06/2022  Reason for Consult: wound R lower leg  Wound type:  1.  R lower leg Full thickness, ? Etiology traumatic per patient, has said from a fall also has said from a burn; says wound has been there for years then says for months, 15 cms x 6 cms by bedside nurse measurement 100% yellow brown eschar  2.  Unstageable Pressure Injury lower vertebral column (above sacrum) 0.5 cms x 0.5 cms 100% yellow slough  Pressure Injury POA: yes  Drainage (amount, consistency, odor)1.  None R lower leg.  2.  Small amount of tan exudate from Pressure Injury  Periwound: 1.  Leg wound mildly pink, peeling skin noted surrounding  2.  Intact sacrum  Dressing procedure/placement/frequency:  Clean vertebral pressure injury with NS, apply Medihoney daily, cover with dry gauze and foam.  May lift foam daily to apply new Medihoney.  Change foam dressing q3 days and prn soiling.  R lower leg apply a single layer Xeroform gauze daily Kellie Simmering [phone]).  Would be cautious to debride this area with patients history of peripheral artery disease and malnutrition.    Patient is at high risk for pressure injuries due to cachexia.  Prevalon boots have been ordered.  Would benefit from low air loss mattress for pressure redistribution and moisture management.   [facility] team will not follow patient at this time.  Re-consult if further needs arise.   Thank you,    Shelton Silvas MSN, RN-BC, [employer] [phone]

## 2022-04-09 NOTE — Progress Notes (Signed)
Echocardiogram 2D Echocardiogram has been performed.  Hector Byrd 04/09/2022, 12:42 PM

## 2022-04-10 ENCOUNTER — Encounter (HOSPITAL_COMMUNITY): Payer: Self-pay | Admitting: Family Medicine

## 2022-04-10 DIAGNOSIS — F418 Other specified anxiety disorders: Secondary | ICD-10-CM

## 2022-04-10 DIAGNOSIS — C762 Malignant neoplasm of abdomen: Secondary | ICD-10-CM

## 2022-04-10 DIAGNOSIS — Z515 Encounter for palliative care: Secondary | ICD-10-CM

## 2022-04-10 DIAGNOSIS — R627 Adult failure to thrive: Secondary | ICD-10-CM | POA: Diagnosis not present

## 2022-04-10 DIAGNOSIS — I2510 Atherosclerotic heart disease of native coronary artery without angina pectoris: Secondary | ICD-10-CM | POA: Diagnosis not present

## 2022-04-10 DIAGNOSIS — K8689 Other specified diseases of pancreas: Secondary | ICD-10-CM | POA: Diagnosis not present

## 2022-04-10 LAB — COMPREHENSIVE METABOLIC PANEL
ALT: 24 U/L (ref 0–44)
AST: 26 U/L (ref 15–41)
Albumin: 3.3 g/dL — ABNORMAL LOW (ref 3.5–5.0)
Alkaline Phosphatase: 78 U/L (ref 38–126)
Anion gap: 12 (ref 5–15)
BUN: 44 mg/dL — ABNORMAL HIGH (ref 8–23)
CO2: 27 mmol/L (ref 22–32)
Calcium: 9.1 mg/dL (ref 8.9–10.3)
Chloride: 100 mmol/L (ref 98–111)
Creatinine, Ser: 0.92 mg/dL (ref 0.61–1.24)
GFR, Estimated: >60 mL/min (ref >60)
Glucose, Bld: 122 mg/dL — ABNORMAL HIGH (ref 70–99)
Potassium: 3.9 mmol/L (ref 3.5–5.1)
Sodium: 139 mmol/L (ref 135–145)
Total Bilirubin: 1.5 mg/dL — ABNORMAL HIGH (ref 0.3–1.2)
Total Protein: 6 g/dL — ABNORMAL LOW (ref 6.5–8.1)

## 2022-04-10 LAB — CBC
HCT: 40.9 % (ref 39.0–52.0)
Hemoglobin: 14.3 g/dL (ref 13.0–17.0)
MCH: 31.9 pg (ref 26.0–34.0)
MCHC: 35 g/dL (ref 30.0–36.0)
MCV: 91.3 fL (ref 80.0–100.0)
Platelets: 190 10*3/uL (ref 150–400)
RBC: 4.48 MIL/uL (ref 4.22–5.81)
RDW: 15.9 % — ABNORMAL HIGH (ref 11.5–15.5)
WBC: 8.9 10*3/uL (ref 4.0–10.5)
nRBC: 0 % (ref 0.0–0.2)

## 2022-04-10 LAB — HEPARIN LEVEL (UNFRACTIONATED): Heparin Unfractionated: 0.37 IU/mL (ref 0.30–0.70)

## 2022-04-10 LAB — MAGNESIUM: Magnesium: 2.2 mg/dL (ref 1.7–2.4)

## 2022-04-10 LAB — HEMOGLOBIN A1C
Hgb A1c MFr Bld: 6.5 % — ABNORMAL HIGH (ref 4.8–5.6)
Mean Plasma Glucose: 140 mg/dL

## 2022-04-10 MED ORDER — TAMSULOSIN HCL 0.4 MG PO CAPS
0.4000 mg | ORAL_CAPSULE | Freq: Every day | ORAL | Status: DC
  Administered 2022-04-10 – 2022-04-13 (×4): 0.4 mg via ORAL
  Filled 2022-04-10 (×4): qty 1

## 2022-04-10 MED ORDER — OXYCODONE HCL 5 MG PO TABS
5.0000 mg | ORAL_TABLET | ORAL | Status: DC | PRN
  Administered 2022-04-11 – 2022-04-13 (×3): 5 mg via ORAL
  Filled 2022-04-10 (×4): qty 1

## 2022-04-10 MED ORDER — LOPERAMIDE HCL 2 MG PO CAPS
2.0000 mg | ORAL_CAPSULE | ORAL | Status: AC
  Administered 2022-04-10: 2 mg via ORAL
  Filled 2022-04-10: qty 1

## 2022-04-10 MED ORDER — LOPERAMIDE HCL 2 MG PO CAPS: 2.0000 mg | ORAL_CAPSULE | ORAL | Status: DC | PRN

## 2022-04-10 MED ORDER — MIRTAZAPINE 15 MG PO TBDP
7.5000 mg | ORAL_TABLET | Freq: Every day | ORAL | Status: DC
  Administered 2022-04-10 – 2022-04-12 (×3): 7.5 mg via ORAL
  Filled 2022-04-10 (×4): qty 0.5

## 2022-04-10 MED ORDER — VENLAFAXINE HCL ER 75 MG PO CP24
75.0000 mg | ORAL_CAPSULE | Freq: Every day | ORAL | Status: DC
  Administered 2022-04-10 – 2022-04-13 (×4): 75 mg via ORAL
  Filled 2022-04-10 (×4): qty 1

## 2022-04-10 MED ORDER — ALPRAZOLAM 0.5 MG PO TABS
0.5000 mg | ORAL_TABLET | Freq: Three times a day (TID) | ORAL | Status: DC | PRN
  Administered 2022-04-10: 0.5 mg via ORAL
  Filled 2022-04-10: qty 1

## 2022-04-10 MED ORDER — HYDROMORPHONE HCL 1 MG/ML IJ SOLN: 1.0000 mg | INTRAMUSCULAR | Status: DC | PRN

## 2022-04-10 MED ORDER — OXYCODONE HCL 5 MG PO TABS
10.0000 mg | ORAL_TABLET | Freq: Two times a day (BID) | ORAL | Status: DC
  Administered 2022-04-10 – 2022-04-11 (×3): 10 mg via ORAL
  Filled 2022-04-10 (×3): qty 2

## 2022-04-10 NOTE — Plan of Care (Signed)

## 2022-04-10 NOTE — Consult Note (Addendum)
Consultation Note Date: 04/10/2022   Patient Name: Hector Byrd  DOB: 1955/09/25  MRN: [id number]  Age / Sex: 67 y.o., male  PCP: Early, Coralee Pesa, NP Referring Physician: Florencia Reasons, MD  Reason for Consultation:  goals of care discussion, likely metastatic cancer, came in with FTT/significant weight loss  HPI/Patient Profile: 67 y.o. male  with past medical history of anxiety and depression, CAD, PAD, cardiomyopathy with ICD, ascending aortic dilatation, abdominal pain with weight loss, admitted on 04/08/2022 with progressive weakness. Workup reveals likely stage IV pancreatic cancer with pancreatic mass, abdominal carcinamatosis, and cavitary lung nodules evident on imaging. Palliative medicine consulted for goals of care.   Primary Decision Maker PATIENT  Discussion: I have reviewed medical records including EPIC notes, labs and imaging, received report from RN, assessed the patient and then met at the bedside along with the patient to discuss diagnosis prognosis, GOC, EOL wishes, disposition and options.  I introduced Palliative Medicine as specialized medical care for people living with serious illness. It focuses on providing relief from the symptoms and stress of a serious illness. The goal is to improve quality of life for both the patient and the family.  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 He lives alone with his dog Munson, a 54 yr old [place]. He has a sister in [place]. If he was unable to make decisions he would want his sister Hector Byrd to be his Media planner.    Hector Byrd is working on processing the new likely Stage IV cancer diagnosis. He understands this is likely a terminal illness for him as Oncology has been consulted and they do not believe he would be able to tolerate systemic therapy. He is emotionally overwhelmed with this diagnosis.   We discussed his  fears and his hopes. He worries about being in pain, about the dying process and about what happens after death. He is hopeful to be reunited with his deceased family members after death.   Advanced directives, and concepts specific to code status were considered and discussed. He is agreeable to [facility] consult for both spiritual/emotional support and to complete HCPOA/Living Will. He agrees with DNR order understanding that if his heart stopped and he stopped breathing and he died- he would not want CPR and efforts made to resuscitate him. He does not want his dying process to be prolonged artificially.   His current goals of care are to establish an official diagnosis. We discussed the possibility of avoiding biopsy and transitioning to comfort measures only, however, Barth wishes for the confirmation of the diagnosis that a biopsy will bring.   Symptom burden was discussed- he has constant pain in his abdomen. Currently receiving hydrocodone/acetaminophen but this is ineffective. He has no appetite and does not want to eat or drink as he has diarrhea with all intake. He is anxious, not sleeping.   Hospice and Palliative Care services outpatient were explained and offered. Hospice will be appropriate at discharge.   Discussed the  importance of continued conversation with family and the medical providers regarding overall plan of care and treatment options, ensuring decisions are within the context of the patient's values and GOCs.    Questions and concerns were addressed. PMT will continue to support.    SUMMARY OF RECOMMENDATIONS -DNR- limited interventions -Spiritual care referral for AD and emotional support -He agreed to allow me to call his sister- Hector Byrd- I have left her a message -Proceed with biopsy- will further discuss comfort care/hospice once biopsy results per patient's wishes -Symptom management- (pain, anxiety, diarrhea, appetite, insomnia) -Oxycodone 10mg  BID, oxycodone 5mg   q2hr prn for breakthrough pain- will transition to long acting once 24 hour opioid requirements are determined -Hydromorphone 1mg  IV q2hrs prn for severe pain -Restart home Effexor XR 75mg  po daily -Mirtazipine 7.5mg  QHS for symptom cluster- anorexia, anxiety, insomnia -Xanax 0.5 mg TID PRN for anxiety -Encouraged Lucus to reach out to his friends and sisters for emotional support   Code Status/Advance Care Planning: DNR   Prognosis:   < 6 months  Discharge Planning: To Be Determined - likely home with hospice  Primary Diagnoses: Present on Admission:  Failure to thrive in adult  Emphysema lung (HCC)  CAD (coronary artery disease)  Elevated troponin  Left thalamic infarction (HCC)  Chronic combined systolic (congestive) and diastolic (congestive) heart failure (HCC)  Ascending aorta dilatation (HCC)  Abdominal pain   Review of Systems  Constitutional:  Positive for appetite change, fatigue and unexpected weight change.  Gastrointestinal:  Positive for abdominal pain. Negative for abdominal distention.  Psychiatric/Behavioral:  The patient is nervous/anxious.     Physical Exam Vitals and nursing note reviewed.  Constitutional:      Comments: Cachetic   Pulmonary:     Effort: Pulmonary effort is normal.  Musculoskeletal:     Comments: Diffuse muscle wasting  Neurological:     Mental Status: He is alert.  Psychiatric:     Comments: Depressed, anxious affect     Vital Signs: BP 107/88 (BP Location: Left Arm)   Pulse 72   Temp 98.1 F (36.7 C) (Oral)   Resp 17   Ht 5\' 7"  (1.702 m)   Wt 37.5 kg   SpO2 100%   BMI 12.95 kg/m  Pain Scale: 0-10   Pain Score: 3    SpO2: SpO2: 100 % O2 Device:SpO2: 100 % O2 Flow Rate: .   IO: Intake/output summary:  Intake/Output Summary (Last 24 hours) at 04/10/2022 1348 Last data filed at 04/10/2022 [id number] Gross per 24 hour  Intake 1773.61 ml  Output 301 ml  Net 1472.61 ml    LBM: Last BM Date : 04/09/22 Baseline  Weight: Weight: 40.8 kg Most recent weight: Weight: 37.5 kg       Thank you for this consult. Palliative medicine will continue to follow and assist as needed.   Time Total: 120 minutes  Greater than 50%  of this time was spent counseling and coordinating care related to the above assessment and plan.  Signed by: Mariana Kaufman, AGNP-C Palliative Medicine    Please contact Palliative Medicine Team phone at [phone] for questions and concerns.  For individual provider: See Shea Evans

## 2022-04-10 NOTE — Progress Notes (Signed)
Calorie Count Note - Day 1 Results  48 hour calorie count ordered.  Diet: Regular, thin Supplements: Ensure Enlive - TID, Magic Cup - TID  Breakfast: No Documentation Lunch: No Documentation Dinner: No Documentation Supplements: 125 kcal, 10 gm protein   *Per RN and NT, pt is not eating anything. Drank 1/2 an Ensure this morning.  Total intake: 125 kcal (8% of minimum estimated needs)  10 protein (13% of minimum estimated needs)  Nutrition Diagnosis: Inadequate oral intake related to acute illness, chronic illness (FTT) as evidenced by other (comment), percent weight loss (BMI 12.96, per chart review).   Goal: Patient will meet greater than or equal to 90% of their needs   Intervention:  Continue regular diet as ordered 48 hour calorie count Ensure Enlive po TID, each supplement provides 350 kcal and 20 grams of protein. Magic cup TID with meals, each supplement provides 290 kcal and 9 grams of protein MVI with minerals daily Pt is at high refeeding risk, recommend thiamine 100mg  x 7 days Will monitor PO intake, if pt unable to adequately meet his needs via PO intake, recommend Cotrak placement for supplemental nutrition support    Hermina Barters RD, LDN Clinical Dietitian See Shea Evans for contact information.

## 2022-04-10 NOTE — Progress Notes (Signed)
PROGRESS NOTE    Hector Byrd  [id number] DOB: Jun 07, 1955 DOA: 04/08/2022 PCP: Orma Render, NP     Brief Narrative:    depression, CAD, PAD, cardiomyopathy with ICD, ascending aortic dilatation, and recent abdominal pain, loss of appetite, and weight loss presents to the emergency department with progressive generalized weakness and multiple falls.   Subjective:  He is thankful that he has a good conversation with palliative care, he plan to get the biopsy to get the diagnosis, he would like to have pain better controlled, he is also bothered by stool incontinence  Assessment & Plan:  Principal Problem:   Failure to thrive in adult Active Problems:   CAD (coronary artery disease)   Emphysema lung (HCC)   Chronic combined systolic (congestive) and diastolic (congestive) heart failure (HCC)   Ascending aorta dilatation (HCC)   Elevated troponin   Left thalamic infarction ([place])   Abdominal pain    Assessment and Plan:  Likely metastatic pancreatic cancer with carcinomatosis on CT ab CT chest does show emphysema and numerous bilateral lung nodules,  several of which demonstrate central cavitation  oncology Dr Benay Spice input appreciated, patient is interested in biopsy to get diagnosis, npo aftermidnight, plan for biopsy on 3/28 by IR  Chest pain with tachyardia, mild elevation of troponin Seen by cardiology , currently on Lipitor, Plavix, Lopressor  echo lvef 99991111, grade 1 diastolic dysfunction  CTA chest no PE , .  Chronic combined  chf Echo 99991111, grade 1 diastolic dysfunction  Does not appear volume overloaded Received gentle hydration Close monitor volume status Cardiology following   Left thalamic infarction Not able to do MRI due to history of ICD Repeat CT head" Unchanged appearance of the age indeterminate lacunar infarct at the left thalamus. No new abnormality including intracranial hemorrhage."  H/o PVD, chronic right lower extremity wound Seen  by wound care  Significant weight loss /FTT/poor prognosis Palliative care consulted for goals of care, DNR, likely will benefit from hospice Appreciate palliative care help in Symptom control    Nutritional Assessment: The patient's BMI is: Body mass index is 12.95 kg/m.Marland Kitchen Seen by dietician.  I agree with the assessment and plan as outlined below: Nutrition Status: Nutrition Problem: Inadequate oral intake Etiology: acute illness, chronic illness (FTT) Signs/Symptoms: other (comment), percent weight loss (BMI 12.96, per chart review) Percent weight loss: 38.4 % Interventions: Ensure Enlive (each supplement provides 350kcal and 20 grams of protein), MVI  .        Skin Assessment: I have examined the patient's skin and I agree with the wound assessment as performed by the wound care RN as outlined below: Pressure Injury 04/08/22 Pretibial Right Large scabbed wound on outside of right leg. (Active)  04/08/22 2100  Location: Pretibial  Location Orientation: Right  Staging:   Wound Description (Comments): Large scabbed wound on outside of right leg.  Present on Admission: Yes     Pressure Injury Heel Left Stage 1 -  Intact skin with non-blanchable redness of a localized area usually over a bony prominence. (Active)     Location: Heel  Location Orientation: Left  Staging: Stage 1 -  Intact skin with non-blanchable redness of a localized area usually over a bony prominence.  Wound Description (Comments):   Present on Admission: Yes     Pressure Injury 04/09/22 Vertebral column Lower;Mid Unstageable - Full thickness tissue loss in which the base of the injury is covered by slough (yellow, tan, gray, green or brown) and/or eschar (tan,  brown or black) in the wound bed. 0.5 x 0.5 100% yel (Active)  04/09/22 1100  Location: Vertebral column  Location Orientation: Lower;Mid  Staging: Unstageable - Full thickness tissue loss in which the base of the injury is covered by slough  (yellow, tan, gray, green or brown) and/or eschar (tan, brown or black) in the wound bed.  Wound Description (Comments): 0.5 x 0.5 100% yellow slough  Present on Admission: Yes  Dressing Type Honey 04/09/22 0800   Seen bu wound care  I have Reviewed nursing notes, Vitals, pain scores, I/o's, Lab results and  imaging results since pt's last encounter, details please see discussion above  I ordered the following labs:  Unresulted Labs (From admission, onward)     Start     Ordered   04/15/22 0500  Creatinine, serum  (enoxaparin (LOVENOX)    CrCl >/= 30 ml/min)  Weekly,   R     Comments: while on enoxaparin therapy    04/08/22 2309   04/11/22 0500  Heparin level (unfractionated)  Daily,   R      04/09/22 2225   04/10/22 0500  CEA  Tomorrow morning,   R        04/09/22 1354   04/10/22 0500  Cancer antigen 19-9  Tomorrow morning,   R        04/09/22 1354   04/09/22 0500  Comprehensive metabolic panel  (Labs)  Daily,   R      04/08/22 2309   04/09/22 0500  CBC  Daily,   R      04/08/22 2309   04/08/22 1732  Rapid urine drug screen (hospital performed)  ONCE - STAT,   STAT        04/08/22 1731   04/08/22 1732  Urinalysis, Routine w reflex microscopic -Urine, Clean Catch  Once,   URGENT       Question:  Specimen Source  Answer:  Urine, Clean Catch   04/08/22 1731             DVT prophylaxis: lovneox then heparin drip in case of pe   Code Status:   Code Status: DNR  Family Communication: sister over the phone Disposition:   Dispo: The patient is from: home, lives by himself, next of kin is sister              Anticipated d/c is to: TBD              Anticipated d/c date is: >24hrs  Antimicrobials:    Anti-infectives (From admission, onward)    None          Objective: Vitals:   04/09/22 1415 04/09/22 1936 04/10/22 0358 04/10/22 0823  BP:  123/69 124/66 107/88  Pulse: 73 72 64 72  Resp:  16 14 17   Temp:  97.7 F (36.5 C) 98 F (36.7 C) 98.1 F (36.7 C)   TempSrc:  Oral Oral Oral  SpO2: 100% 96%  100%  Weight:      Height:        Intake/Output Summary (Last 24 hours) at 04/10/2022 1349 Last data filed at 04/10/2022 0842 Gross per 24 hour  Intake 1773.61 ml  Output 301 ml  Net 1472.61 ml   Filed Weights   04/09/22 0028 04/09/22 0232  Weight: 40.8 kg 37.5 kg    Examination:  General exam: very fail, weak, cachectic, oriented x3 Respiratory system: Clear to auscultation. Respiratory effort normal. Cardiovascular system:  sinus tachycardia  Gastrointestinal system:  Abdomen is nondistended, soft and nontender.  Normal bowel sounds heard. Central nervous system: Alert and oriented. No focal neurological deficits. Extremities:  right leg wound now wrapped,  Skin: right leg wound, unstageable pressure injury low back above sacrum Psychiatry: flat affect    Data Reviewed: I have personally reviewed  labs and visualized  imaging studies since the last encounter and formulate the plan        Scheduled Meds:  atorvastatin  80 mg Oral Daily   feeding supplement  237 mL Oral TID BM   lamoTRIgine  100 mg Oral BID   leptospermum manuka honey  1 Application Topical Daily   metoprolol succinate  50 mg Oral Daily   mirtazapine  7.5 mg Oral QHS   multivitamin with minerals  1 tablet Oral Daily   oxyCODONE  10 mg Oral BID   pantoprazole  40 mg Oral Daily   thiamine  100 mg Oral Daily   Continuous Infusions:     LOS: 2 days   Time spent:  68mins  Florencia Reasons, MD PhD FACP [facility]  Available via Epic secure chat 7am-7pm for nonurgent issues Please page for urgent issues To page the attending provider between 7A-7P or the covering provider during after hours 7P-7A, please log into the web site [email or web address] and access using universal [place] password for that web site. If you do not have the password, please call the hospital operator.    04/10/2022, 1:49 PM

## 2022-04-10 NOTE — Progress Notes (Signed)
Wound care orders reviewed and discussed with patient.  Patient declined dressing change d/t.wanting ointment applied. Notified MD, received confirmation of dry dressing, no changes to order. Patient informed that he will have his daughter come to change the dressing the way she does at home daily. MD notified of refusal.

## 2022-04-10 NOTE — Progress Notes (Signed)
Rounding Note    Patient Name: Hector Byrd Date of Encounter: 04/10/2022  [place] Cardiologist: Fransico Him, MD    Subjective    67 yo with CAD, s/p stenting / CABG , CHF, ICD Progressive weight loss Has pancreatic lesions concerning for pancreatic cancer  Has had progressive weakness.   Inpatient Medications    Scheduled Meds:  atorvastatin  80 mg Oral Daily   feeding supplement  237 mL Oral TID BM   lamoTRIgine  100 mg Oral BID   leptospermum manuka honey  1 Application Topical Daily   metoprolol succinate  50 mg Oral Daily   multivitamin with minerals  1 tablet Oral Daily   pantoprazole  40 mg Oral Daily   thiamine  100 mg Oral Daily   Continuous Infusions:  PRN Meds: acetaminophen **OR** acetaminophen (TYLENOL) oral liquid 160 mg/5 mL **OR** acetaminophen, HYDROcodone-acetaminophen, melatonin   Vital Signs    Vitals:   04/09/22 1415 04/09/22 1936 04/10/22 0358 04/10/22 0823  BP:  123/69 124/66 107/88  Pulse: 73 72 64 72  Resp:  16 14 17   Temp:  97.7 F (36.5 C) 98 F (36.7 C) 98.1 F (36.7 C)  TempSrc:  Oral Oral Oral  SpO2: 100% 96%  100%  Weight:      Height:        Intake/Output Summary (Last 24 hours) at 04/10/2022 0936 Last data filed at 04/10/2022 [id number] Gross per 24 hour  Intake 1773.61 ml  Output 301 ml  Net 1472.61 ml      04/09/2022    2:32 AM 04/09/2022   12:28 AM 03/19/2022   11:32 AM  Last 3 Weights  Weight (lbs) 82 lb 10.8 oz 90 lb 95 lb 0.6 oz  Weight (kg) 37.5 kg 40.824 kg 43.109 kg      Telemetry    NSR  - Personally Reviewed  ECG     - Personally Reviewed  Physical Exam   GEN:  cachectic male, echymosis on the left side of his face. Neck: No JVD Cardiac: RRR, no murmurs, rubs, or gallops.  Respiratory: Clear to auscultation bilaterally. GI: Soft, nontender, non-distended  MS: No edema; No deformity. Neuro:  Nonfocal  Psych: Normal affect   Labs    High Sensitivity Troponin:   Recent Labs  Lab  04/08/22 2117 04/08/22 2349  TROPONINIHS 110* 102*     Chemistry Recent Labs  Lab 04/08/22 2117 04/08/22 2349 04/10/22 0701  NA 136 138 139  K 4.3 4.0 3.9  CL 96* 99 100  CO2 25 23 27   GLUCOSE 117* 103* 122*  BUN 49* 50* 44*  CREATININE 1.14 1.07 0.92  CALCIUM 9.4 9.4 9.1  MG  --   --  2.2  PROT 6.0* 6.5 6.0*  ALBUMIN 3.4* 3.6 3.3*  AST 34 QUANTITY NOT SUFFICIENT, UNABLE TO PERFORM TEST 26  ALT 26 QUANTITY NOT SUFFICIENT, UNABLE TO PERFORM TEST 24  ALKPHOS 76 85 78  BILITOT 1.7* QUANTITY NOT SUFFICIENT, UNABLE TO PERFORM TEST 1.5*  GFRNONAA >60 >60 >60  ANIONGAP 15 16* 12    Lipids  Recent Labs  Lab 04/08/22 2349  CHOL 264*  TRIG 185*  HDL 68  LDLCALC 159*  CHOLHDL 3.9    Hematology Recent Labs  Lab 04/08/22 2117 04/09/22 0426 04/10/22 0701  WBC 8.7 8.6 8.9  RBC 4.32 4.16* 4.48  HGB 13.7 13.1 14.3  HCT 39.8 38.2* 40.9  MCV 92.1 91.8 91.3  MCH 31.7 31.5 31.9  MCHC  34.4 34.3 35.0  RDW 15.3 15.4 15.9*  PLT 235 231 190   Thyroid  Recent Labs  Lab 04/09/22 0426  TSH 2.943    BNPNo results for input(s): "BNP", "PROBNP" in the last 168 hours.  DDimer No results for input(s): "DDIMER" in the last 168 hours.   Radiology    CT Angio Chest Pulmonary Embolism (PE) W or WO Contrast  Result Date: 04/09/2022 CLINICAL DATA:  Chest pain tachycardia EXAM: CT ANGIOGRAPHY CHEST WITH CONTRAST TECHNIQUE: Multidetector CT imaging of the chest was performed using the standard protocol during bolus administration of intravenous contrast. Multiplanar CT image reconstructions and MIPs were obtained to evaluate the vascular anatomy. RADIATION DOSE REDUCTION: This exam was performed according to the departmental dose-optimization program which includes automated exposure control, adjustment of the mA and/or kV according to patient size and/or use of iterative reconstruction technique. CONTRAST:  20mL OMNIPAQUE IOHEXOL 350 MG/ML SOLN COMPARISON:  CT 04/09/2022, chest CT  03/08/2021 FINDINGS: Cardiovascular: Satisfactory opacification of the pulmonary arteries to the segmental level. No evidence of pulmonary embolism. Moderate aortic atherosclerosis. Coronary vascular calcification. Normal cardiac size. No pericardial effusion. Mediastinum/Nodes: Midline trachea. No thyroid mass. No suspicious lymph nodes. Esophagus within normal limits. Lungs/Pleura: Emphysema. Numerous bilateral pulmonary nodules, several of which demonstrate central cavitation, the largest is seen in the peripheral left upper lobe on series 6, image 51 and measures 2 by 1.9 cm. Upper Abdomen: See separately dictated CT abdomen report Musculoskeletal: Post sternotomy changes. No acute or suspicious osseous abnormality. Review of the MIP images confirms the above findings. IMPRESSION: 1. Negative for acute pulmonary embolus. 2. Emphysema. Numerous bilateral pulmonary nodules, several of which demonstrate central cavitation. Findings could be secondary to septic emboli, metastatic disease, or atypical infection. 3. Aortic atherosclerosis. Aortic Atherosclerosis (ICD10-I70.0) and Emphysema (ICD10-J43.9). Electronically Signed   By: Donavan Foil M.D.   On: 04/09/2022 18:53   CT HEAD WO CONTRAST (5MM)  Result Date: 04/09/2022 CLINICAL DATA:  Stroke follow-up.  Unable to do brain MRI due to ICD EXAM: CT HEAD WITHOUT CONTRAST TECHNIQUE: Contiguous axial images were obtained from the base of the skull through the vertex without intravenous contrast. RADIATION DOSE REDUCTION: This exam was performed according to the departmental dose-optimization program which includes automated exposure control, adjustment of the mA and/or kV according to patient size and/or use of iterative reconstruction technique. COMPARISON:  Yesterday FINDINGS: Brain: Indistinct left thalamic infarct, again age indeterminate. No acute hemorrhage or visible cortex infarct. No mass, hydrocephalus, or collection. Vascular: No hyperdense vessel or  unexpected calcification. Skull: No acute or aggressive finding. Osteoma at the right mandibular notch. Cachectic appearance of the upper cervical soft tissues. Sinuses/Orbits: Negative IMPRESSION: Unchanged appearance of the age indeterminate lacunar infarct at the left thalamus. No new abnormality including intracranial hemorrhage. Electronically Signed   By: Jorje Guild M.D.   On: 04/09/2022 18:48   ECHOCARDIOGRAM COMPLETE  Result Date: 04/09/2022    ECHOCARDIOGRAM REPORT   Patient Name:   LYNNWOOD KOHOUT Date of Exam: 04/09/2022 Medical Rec #:  [id number]   Height:       67.0 in Accession #:    [id number]  Weight:       82.7 lb Date of Birth:  02-Apr-1955   BSA:          1.389 m Patient Age:    5 years    BP:           143/81 mmHg Patient Gender: M  HR:           78 bpm. Exam Location:  Inpatient Procedure: 2D Echo, Color Doppler and Cardiac Doppler Indications:    Stroke I63.9, Elevated Troponin  History:        Patient has prior history of Echocardiogram examinations, most                 recent 02/16/2021. Cardiomyopathy and CHF, CAD, Defibrillator and                 Prior CABG, PAD, Arrythmias:Tachycardia, Signs/Symptoms:Dyspnea                 and Shortness of Breath; Risk Factors:Dyslipidemia and Current                 Smoker. Kidney Cancer.  Sonographer:    Ronny Flurry Referring Phys: [id number] TIMOTHY S OPYD  Sonographer Comments: Image acquisition challenging due to patient body habitus. IMPRESSIONS  1. No left ventricular thrombus is seen. Left ventricular ejection fraction, by estimation, is 30 to 35%. The left ventricle has moderately decreased function. The left ventricle demonstrates regional wall motion abnormalities (see scoring diagram/findings for description). Left ventricular diastolic parameters are consistent with Grade I diastolic dysfunction (impaired relaxation). Basal inferoposterior dyskinesis/aneurysm is present. The basal anterior wall and anterior septum are  hypokinetic, although the apex contracts normally, consistent with high grade stenosis or occlusion of the native coronary with patent distal bypass graft to the LAD artery.  2. Right ventricular systolic function is mildly reduced. The right ventricular size is normal.  3. The liver appears small. There is intestine anterior to the liver in the subcostal views.  4. The mitral valve is normal in structure. Mild mitral valve regurgitation.  5. There is partial right-left aortic cusp fusion with an incomplete raphe. The aortic valve is bicuspid. There is mild calcification of the aortic valve. There is mild thickening of the aortic valve. Aortic valve regurgitation is mild to moderate. Aortic valve sclerosis is present, with no evidence of aortic valve stenosis.  6. Aortic dilatation noted. There is borderline dilatation of the aortic root, measuring 38 mm. Comparison(s): The left ventricular function is worsened. The left ventricular wall motion worse. There is worsened function in the basal anterior wall, with preserved apical contractility. 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 Conclusion(s)/Recommendation(s): If cardioembolic stroke is strongly suspected, recommend repeat imaging with Definity contrast. FINDINGS  Left Ventricle: No left ventricular thrombus is seen. Left ventricular ejection fraction, by estimation, is 30 to 35%. The left ventricle has moderately decreased function. The left ventricle demonstrates regional wall motion abnormalities. The left ventricular internal cavity size was normal in size. There is no left ventricular hypertrophy. Left ventricular diastolic parameters are consistent with Grade I diastolic dysfunction (impaired relaxation). Normal left ventricular filling pressure.  LV Wall Scoring: The inferior wall and mid inferolateral segment are aneurysmal. The basal inferolateral segment is akinetic. The anterior wall, antero-lateral wall, mid inferoseptal segment, and basal  inferoseptal segment are hypokinetic. The entire anterior septum and entire apex are normal. Basal inferoposterior dyskinesis/aneurysm is present. The basal anterior wall and anterior septum are hypokinetic, although the apex contracts normally, consistent with high grade stenosis or occlusion of the native coronary with patent distal bypass graft to the LAD artery. Right Ventricle: The right ventricular size is normal. No increase in right ventricular wall thickness. Right ventricular systolic function is mildly reduced. Left Atrium: Left atrial size was normal in size. Right Atrium: Right atrial  size was normal in size. Pericardium: The liver appears small. There is intestine anterior to the liver in the subcostal views. There is no evidence of pericardial effusion. Mitral Valve: The mitral valve is normal in structure. Mild mitral valve regurgitation, with eccentric posteriorly directed jet. Tricuspid Valve: The tricuspid valve is normal in structure. Tricuspid valve regurgitation is mild. Aortic Valve: There is partial right-left aortic cusp fusion with an incomplete raphe. The aortic valve is bicuspid. There is mild calcification of the aortic valve. There is mild thickening of the aortic valve. Aortic valve regurgitation is mild to moderate. Aortic valve sclerosis is present, with no evidence of aortic valve stenosis. Aortic valve mean gradient measures 2.5 mmHg. Aortic valve peak gradient measures 5.0 mmHg. Aortic valve area, by VTI measures 2.47 cm. Pulmonic Valve: The pulmonic valve was normal in structure. Pulmonic valve regurgitation is trivial. Aorta: Aortic dilatation noted. There is borderline dilatation of the aortic root, measuring 38 mm. IAS/Shunts: No atrial level shunt detected by color flow Doppler. Additional Comments: A device lead is visualized.  LEFT VENTRICLE PLAX 2D LVIDd:         4.20 cm   Diastology LVIDs:         3.60 cm   LV e' medial:  4.22 cm/s LV PW:         0.85 cm   LV e' lateral:  4.30 cm/s LV IVS:        0.90 cm LVOT diam:     2.30 cm LV SV:         51 LV SV Index:   37 LVOT Area:     4.15 cm  RIGHT VENTRICLE RV S prime:     6.81 cm/s TAPSE (M-mode): 0.9 cm LEFT ATRIUM         Index LA diam:    2.60 cm 1.87 cm/m  AORTIC VALVE AV Area (Vmax):    2.69 cm AV Area (Vmean):   2.53 cm AV Area (VTI):     2.47 cm AV Vmax:           112.00 cm/s AV Vmean:          73.600 cm/s AV VTI:            0.207 m AV Peak Grad:      5.0 mmHg AV Mean Grad:      2.5 mmHg LVOT Vmax:         72.40 cm/s LVOT Vmean:        44.850 cm/s LVOT VTI:          0.123 m LVOT/AV VTI ratio: 0.60  AORTA Ao Root diam: 3.80 cm Ao Asc diam:  3.60 cm TRICUSPID VALVE TR Peak grad:   12.2 mmHg TR Vmax:        175.00 cm/s  SHUNTS Systemic VTI:  0.12 m Systemic Diam: 2.30 cm Mihai Croitoru MD Electronically signed by Sanda Klein MD Signature Date/Time: 04/09/2022/2:26:17 PM    Final    CT ABDOMEN PELVIS W CONTRAST  Result Date: 04/09/2022 CLINICAL DATA:  Abdominal pain, acute, nonlocalized abdominal pain, unintentional weight loss EXAM: CT ABDOMEN AND PELVIS WITH CONTRAST TECHNIQUE: Multidetector CT imaging of the abdomen and pelvis was performed using the standard protocol following bolus administration of intravenous contrast. RADIATION DOSE REDUCTION: This exam was performed according to the departmental dose-optimization program which includes automated exposure control, adjustment of the mA and/or kV according to patient size and/or use of iterative reconstruction technique. CONTRAST:  85mL OMNIPAQUE IOHEXOL  350 MG/ML SOLN COMPARISON:  CT angiography chest 03/08/2021, CT abdomen pelvis 11/22/2012 FINDINGS: Lower chest: Emphysematous changes. Interval development of scattered pulmonary nodules and subcentimeter cavitary lesions. As an example a 0.4 cm right middle lobe pulmonary nodule (5:14) and a left lower lobe 1.2 by 0.9 cm cavitary lesion (5:16). Partially visualized cardiac defibrillator lead. Hepatobiliary: No focal  liver abnormality. No gallstones, gallbladder wall thickening, or pericholecystic fluid. No biliary dilatation. Pancreas: Markedly diffusely atrophic with interval development of an irregular enhancing and cystic mass measuring approximately 5 cm best visualized on coronal imaging (6:34) along the pancreatic body. No surrounding inflammatory changes. Likely main pancreatic duct dilatation associated with the mass given fluid density tubular like appearance of the pancreas just proximal to the mass. Spleen: Normal in size. Nonspecific 1.6 cm hypodense lesion. Nonspecific subcentimeter splenic lesion. Adrenals/Urinary Tract: No adrenal nodule bilaterally. Bilateral kidneys enhance symmetrically. Subcentimeter hypodensities too small characterize-no further follow-up indicated. No hydronephrosis. No hydroureter. The urinary bladder is unremarkable. Stomach/Bowel: Stomach is within normal limits. No evidence of bowel wall thickening or dilatation. Appendix appears normal. 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 Patent right external iliac artery stent. Severe calcified and noncalcified atherosclerotic plaque of the aorta and its branches. No abdominal, pelvic, or inguinal lymphadenopathy. Reproductive: Other: Right upper lobe peritoneal fat stranding and nodularity with as an example a 0.6 x 0.6 cm nodule (3:17). No intraperitoneal free fluid. No intraperitoneal free gas. No organized fluid collection. Musculoskeletal: No abdominal wall hernia or abnormality. No suspicious lytic or blastic osseous lesions. No acute displaced fracture. Multilevel degenerative changes of the spine. Levoscoliosis thoracolumbar spine centered at the L2-L3 level. IMPRESSION: 1. Interval development of peritoneal carcinomatosis with a difficult to visualize irregular cystic and enhancing mass measuring approximately 5 cm along the pancreatic body. Markedly limited evaluation due to lack of  intraperitoneal fat. When the patient is clinically stable and able to follow directions and hold their breath (preferably as an outpatient) further evaluation with dedicated abdominal MRI pancreatic protocol should be considered. 2. Scattered pulmonary nodules and subcentimeter cavitary lesions. Recommend CT chest with intravenous contrast for further evaluation. 3. 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 Recommend follow-up ultrasound every 3 years. This recommendation follows ACR consensus guidelines: White Paper of the ACR Incidental Findings Committee II on Vascular Findings. J Am Coll Radiol 2013; 10:789-794. Electronically Signed   By: Iven Finn M.D.   On: 04/09/2022 01:20   CT Head Wo Contrast  Result Date: 04/08/2022 CLINICAL DATA:  Head trauma, minor. Generalized weakness. Multiple falls. EXAM: CT HEAD WITHOUT CONTRAST TECHNIQUE: Contiguous axial images were obtained from the base of the skull through the vertex without intravenous contrast. RADIATION DOSE REDUCTION: This exam was performed according to the departmental dose-optimization program which includes automated exposure control, adjustment of the mA and/or kV according to patient size and/or use of iterative reconstruction technique. COMPARISON:  Head CT 08/14/2020. FINDINGS: Brain: No acute intracranial hemorrhage. Mild chronic small-vessel disease. 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 No hydrocephalus or extra-axial collection. No mass effect or midline shift. Vascular: No hyperdense vessel or unexpected calcification. Skull: No calvarial fracture or suspicious bone lesion. Skull base is unremarkable. Sinuses/Orbits: Unremarkable. Other: None. IMPRESSION: 1. No acute intracranial hemorrhage. 2. 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 3. Mild chronic small-vessel disease. Electronically Signed   By: Emmit Alexanders M.D.   On: 04/08/2022 18:22   DG Chest 2 View  Result  Date: 04/08/2022 CLINICAL DATA:  Generalized weakness. Multiple falls. Infection evaluation. EXAM: CHEST - 2 VIEW COMPARISON:  Chest CT 03/08/2021.  Radiographs 08/14/2020. FINDINGS: The heart size and mediastinal contours are stable status post median sternotomy. Left subclavian AICD lead projects over the right ventricular apex. The lungs appear hyperinflated but clear. There is no pleural effusion or pneumothorax. No acute osseous findings are seen. There are advanced degenerative changes at both shoulders. Chronic fracture of the left humeral neck may be incompletely healed. Grossly stable chronic compression deformities near the thoracolumbar junction. IMPRESSION: No evidence of acute cardiopulmonary process. Chronic obstructive pulmonary disease. Electronically Signed   By: Richardean Sale M.D.   On: 04/08/2022 18:17    Cardiac Studies     Patient Profile     67 y.o. male with CAD , CHF, AICD   Assessment & Plan     1.   CAD :  no angina .  His generalized weakness and falls are not due to a cardiac etiology   2.  Possible pancreatic cancer:   has pancreatic lesions.   He will likey need a biopsy in order to get a tissue dx. He is at acceptable risk from a cardiac standpoint for EGD or EUS biopsy.     No active cardiac issues.    [place] will sign off.   Medication Recommendations:   Other recommendations (labs, testing, etc):   Follow up as an outpatient:    For questions or updates, please contact [place] Please consult [email or web address] for contact info under        Signed, Mertie Moores, MD  04/10/2022, 9:36 AM

## 2022-04-10 NOTE — Consult Note (Signed)
Chief Complaint: Patient was seen in consultation today for carcinomatosis  Referring Physician(s): Dr. Betsy Coder  Supervising Physician: Markus Daft  Patient Status: [facility] - [facility]-pt  History of Present Illness: Hector Byrd is a 67 y.o. male with past medical history of AAA, AICD placement, anxiety, CAD s/p CABG, HTN, and remote history of kidney cancer who presents with recent, progressive weakness, abdominal pain, nausea, and poor appetite.    CT Abdomen Pelvis: 1. Interval development of peritoneal carcinomatosis with a difficult to visualize irregular cystic and enhancing mass measuring approximately 5 cm along the pancreatic body. Markedly limited evaluation due to lack of intraperitoneal fat. When the patient is clinically stable and able to follow directions and hold their breath (preferably as an outpatient) further evaluation with dedicated abdominal MRI pancreatic protocol should be considered. 2. Scattered pulmonary nodules and subcentimeter cavitary lesions. Recommend CT chest with intravenous contrast for further evaluation. 3. 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 Recommend follow-up ultrasound every 3 years. This recommendation follows ACR consensus guidelines: White Paper of the ACR Incidental Findings Committee II on Vascular Findings. J Am Coll Radiol 2013; 10:789-794.  IR consulted for biopsy at the request of Dr. Benay Spice.   Heme/Onc, primary team, and Palliative care have all initiated conversations with the patient about his likely metastatic pancreatic cancer and associated prognosis.  Patient wishes to proceed with diagnostic work-up and biopsy prior to finalizing care plan and disposition.  Case reviewed by Dr. Anselm Pancoast who approves patient for the procedure.    Past Medical History:  Diagnosis Date   AAA (abdominal aortic aneurysm) ([place])    3.0cm by duplex 12/2019   AICD (automatic cardioverter/defibrillator) present    Anxiety    Aortic  insufficiency    mild to moderate by echo 02/2021   Arthritis    Ascending aorta dilatation (HCC)    4.5cm by echo 02/2021 and 3.6 cm by chest CTA   CAD (coronary artery disease)    a. s/p stent x2 and then CABG 1996.   Cancer of kidney ([place])    Kidney    Cardiomyopathy, ischemic    Chronic combined systolic (congestive) and diastolic (congestive) heart failure ([place]) 07/07/2017   Depression    Dilated aortic root (HCC)    Dyslipidemia    Dyspnea    with exertion   Erectile dysfunction    Fatty liver 11/04/2019   GERD (gastroesophageal reflux disease)    Headache    Hypertension    Kidney cancer, primary, with metastasis from kidney to other site [facility])    Orthostasis    Pneumonia    Restless legs    not bothered by this anymore   Rotator cuff tear    S/P CABG (coronary artery bypass graft)    Ventricular tachycardia [facility])     Past Surgical History:  Procedure Laterality Date   ABDOMINAL AORTOGRAM W/LOWER EXTREMITY N/A 12/17/2021   Procedure: ABDOMINAL AORTOGRAM W/LOWER EXTREMITY;  Surgeon: Lorretta Harp, MD;  Location: [place] [facility];  Service: Cardiovascular;  Laterality: N/A;   CARDIAC CATHETERIZATION     Cardiac stents     prior to 2005- 3 times total   COLONOSCOPY     CORONARY ARTERY BYPASS GRAFT  2005   CORONARY STENT PLACEMENT     ICD LEAD REMOVAL N/A 10/02/2016   Procedure: ICD LEAD REMOVAL WITH IMPLANTATION OF BIOTRONIK PLEXA PROMRI DF-1 S DX 65/17 SN [id number];  Surgeon: Evans Lance, MD;  Location: [place];  Service: Cardiovascular;  Laterality: N/A;  [place] to back up   [place]  12-07-2012   BTK single chamber ICD implanted by Dr Lovena Le   IMPLANTABLE CARDIOVERTER DEFIBRILLATOR IMPLANT N/A 12/07/2012   Procedure: IMPLANTABLE CARDIOVERTER DEFIBRILLATOR IMPLANT;  Surgeon: Evans Lance, MD;  Location: [facility] CATH LAB;  Service: Cardiovascular;  Laterality: N/A;   Kidney resection     small amount of kidney   PERIPHERAL  VASCULAR INTERVENTION Right 12/17/2021   Procedure: PERIPHERAL VASCULAR INTERVENTION;  Surgeon: Lorretta Harp, MD;  Location: [place] [facility];  Service: Cardiovascular;  Laterality: Right;  external iliac   ROTATOR CUFF REPAIR     TEE WITHOUT CARDIOVERSION N/A 10/02/2016   Procedure: TRANSESOPHAGEAL ECHOCARDIOGRAM (TEE);  Surgeon: Evans Lance, MD;  Location[facility] OR;  Service: Cardiovascular;  Laterality: N/A;    Allergies: Patient has no known allergies.  Medications: Prior to Admission medications   Medication Sig Start Date End Date Taking? Authorizing Provider  atorvastatin (LIPITOR) 80 MG tablet TAKE 1 TABLET BY MOUTH EVERY DAY 02/21/22  Yes Turner, Eber Hong, MD  Buprenorphine HCl-Naloxone HCl 8-2 MG FILM Place 0.5-1 Film under the tongue See admin instructions. Take 0.5 film in the morning, 0.5 film at 1300, and 1 film at supper 02/24/20  Yes [provider]  Calcium-Magnesium-Zinc (CAL-MAG-ZINC PO) Take 1 capsule by mouth daily.   Yes [provider]  Cholecalciferol (VITAMIN D3) 50 MCG (2000 UT) TABS Take 2,000 Units by mouth daily.   Yes [provider]  clonazePAM (KLONOPIN) 1 MG tablet Take 1-3 mg by mouth See admin instructions. Take 1 mg in the morning and 3 mg at night 09/23/12  Yes [provider]  clopidogrel (PLAVIX) 75 MG tablet Take 1 tablet (75 mg total) by mouth daily with breakfast. 12/18/21  Yes Almyra Deforest, PA  lamoTRIgine (LAMICTAL) 100 MG tablet Take 100 mg by mouth 2 (two) times daily. 10/21/19  Yes [provider]  lisinopril (ZESTRIL) 5 MG tablet Take 1 tablet (5 mg total) by mouth daily. 02/21/22  Yes Turner, Eber Hong, MD  metoprolol succinate (TOPROL-XL) 50 MG 24 hr tablet TAKE 1 TABLET BY MOUTH DAILY. TAKE WITH OR IMMEDIATELY FOLLOWING A MEAL. Patient taking differently: Take 50 mg by mouth daily. 02/21/22  Yes Turner, Eber Hong, MD  naphazoline-pheniramine (NAPHCON-A) 0.025-0.3 % ophthalmic solution Place 2 drops into both  eyes daily.   Yes [provider]  nitroGLYCERIN (NITROSTAT) 0.4 MG SL tablet Place 1 tablet (0.4 mg total) under the tongue every 5 (five) minutes as needed for chest pain (MAX 3 TABLETS). 11/13/18  Yes Evans Lance, MD  pantoprazole (PROTONIX) 40 MG tablet Take 1 tablet (40 mg total) by mouth daily. 12/18/21 06/16/22 Yes Almyra Deforest, PA  venlafaxine XR (EFFEXOR-XR) 75 MG 24 hr capsule Take 75 mg by mouth 2 (two) times daily.   Yes [provider]  vitamin B-12 (CYANOCOBALAMIN) 500 MCG tablet Take 500 mcg by mouth daily.   Yes [provider]     Family History  Problem Relation Age of Onset   Diabetes Mother    Cancer Father    Suicidality Brother     Social History   Socioeconomic History   Marital status: Single    Spouse name: Not on file   Number of children: Not on file   Years of education: Not on file   Highest education level: Not on file  Occupational History   Not on file  Tobacco Use  Smoking status: Every Day    Types: Cigars   Smokeless tobacco: Never   Tobacco comments:    uses cigarelle- 4 per day  Vaping Use   Vaping Use: Never used  Substance and Sexual Activity   Alcohol use: No    Comment: HISTORY OF DRUG & ETOH 09/19/2004   Drug use: No    Comment: none since 09/19/2004   Sexual activity: Never  Other Topics Concern   Not on file  Social History Narrative   Not on file   Social Determinants of Health   Financial Resource Strain: Not on file  Food Insecurity: Not on file  Transportation Needs: Not on file  Physical Activity: Not on file  Stress: Not on file  Social Connections: Not on file     Review of Systems: A 12 point ROS discussed and pertinent positives are indicated in the HPI above.  All other systems are negative.  Review of Systems  Constitutional:  Positive for activity change, appetite change, fatigue and unexpected weight change.  Respiratory:  Negative for cough and shortness of breath.    Cardiovascular:  Negative for chest pain.  Gastrointestinal:  Positive for abdominal pain and nausea.  Genitourinary:  Negative for dysuria.  Musculoskeletal:  Negative for back pain.  Psychiatric/Behavioral:  Negative for behavioral problems and confusion.     Vital Signs: BP 107/88 (BP Location: Left Arm)   Pulse 72   Temp 98.1 F (36.7 C) (Oral)   Resp 17   Ht 5\' 7"  (1.702 m)   Wt 82 lb 10.8 oz (37.5 kg)   SpO2 100%   BMI 12.95 kg/m   Physical Exam Vitals and nursing note reviewed.  Constitutional:      General: He is not in acute distress.    Appearance: Normal appearance. He is ill-appearing.  HENT:     Mouth/Throat:     Mouth: Mucous membranes are moist.     Pharynx: Oropharynx is clear.  Cardiovascular:     Rate and Rhythm: Normal rate and regular rhythm.  Pulmonary:     Effort: Pulmonary effort is normal. No respiratory distress.     Breath sounds: Normal breath sounds.  Abdominal:     General: Abdomen is flat.     Palpations: Abdomen is soft.  Skin:    General: Skin is warm and dry.  Neurological:     General: No focal deficit present.     Mental Status: He is alert and oriented to person, place, and time. Mental status is at baseline.  Psychiatric:        Mood and Affect: Mood normal.        Behavior: Behavior normal.        Thought Content: Thought content normal.        Judgment: Judgment normal.      MD Evaluation Airway: WNL Heart: WNL Abdomen: WNL Chest/ Lungs: WNL ASA  Classification: 3 Mallampati/Airway Score: Two   Imaging: CT Angio Chest Pulmonary Embolism (PE) W or WO Contrast  Result Date: 04/09/2022 CLINICAL DATA:  Chest pain tachycardia EXAM: CT ANGIOGRAPHY CHEST WITH CONTRAST TECHNIQUE: Multidetector CT imaging of the chest was performed using the standard protocol during bolus administration of intravenous contrast. Multiplanar CT image reconstructions and MIPs were obtained to evaluate the vascular anatomy. RADIATION DOSE  REDUCTION: This exam was performed according to the departmental dose-optimization program which includes automated exposure control, adjustment of the mA and/or kV according to patient size and/or use of iterative reconstruction technique.  CONTRAST:  51mL OMNIPAQUE IOHEXOL 350 MG/ML SOLN COMPARISON:  CT 04/09/2022, chest CT 03/08/2021 FINDINGS: Cardiovascular: Satisfactory opacification of the pulmonary arteries to the segmental level. No evidence of pulmonary embolism. Moderate aortic atherosclerosis. Coronary vascular calcification. Normal cardiac size. No pericardial effusion. Mediastinum/Nodes: Midline trachea. No thyroid mass. No suspicious lymph nodes. Esophagus within normal limits. Lungs/Pleura: Emphysema. Numerous bilateral pulmonary nodules, several of which demonstrate central cavitation, the largest is seen in the peripheral left upper lobe on series 6, image 51 and measures 2 by 1.9 cm. Upper Abdomen: See separately dictated CT abdomen report Musculoskeletal: Post sternotomy changes. No acute or suspicious osseous abnormality. Review of the MIP images confirms the above findings. IMPRESSION: 1. Negative for acute pulmonary embolus. 2. Emphysema. Numerous bilateral pulmonary nodules, several of which demonstrate central cavitation. Findings could be secondary to septic emboli, metastatic disease, or atypical infection. 3. Aortic atherosclerosis. Aortic Atherosclerosis (ICD10-I70.0) and Emphysema (ICD10-J43.9). Electronically Signed   By: Donavan Foil M.D.   On: 04/09/2022 18:53   CT HEAD WO CONTRAST (5MM)  Result Date: 04/09/2022 CLINICAL DATA:  Stroke follow-up.  Unable to do brain MRI due to ICD EXAM: CT HEAD WITHOUT CONTRAST TECHNIQUE: Contiguous axial images were obtained from the base of the skull through the vertex without intravenous contrast. RADIATION DOSE REDUCTION: This exam was performed according to the departmental dose-optimization program which includes automated exposure control,  adjustment of the mA and/or kV according to patient size and/or use of iterative reconstruction technique. COMPARISON:  Yesterday FINDINGS: Brain: Indistinct left thalamic infarct, again age indeterminate. No acute hemorrhage or visible cortex infarct. No mass, hydrocephalus, or collection. Vascular: No hyperdense vessel or unexpected calcification. Skull: No acute or aggressive finding. Osteoma at the right mandibular notch. Cachectic appearance of the upper cervical soft tissues. Sinuses/Orbits: Negative IMPRESSION: Unchanged appearance of the age indeterminate lacunar infarct at the left thalamus. No new abnormality including intracranial hemorrhage. Electronically Signed   By: Jorje Guild M.D.   On: 04/09/2022 18:48   ECHOCARDIOGRAM COMPLETE  Result Date: 04/09/2022    ECHOCARDIOGRAM REPORT   Patient Name:   GARRET MERE Date of Exam: 04/09/2022 Medical Rec #:  [id number]   Height:       67.0 in Accession #:    [id number]  Weight:       82.7 lb Date of Birth:  11-29-1955   BSA:          1.389 m Patient Age:    39 years    BP:           143/81 mmHg Patient Gender: M           HR:           78 bpm. Exam Location:  Inpatient Procedure: 2D Echo, Color Doppler and Cardiac Doppler Indications:    Stroke I63.9, Elevated Troponin  History:        Patient has prior history of Echocardiogram examinations, most                 recent 02/16/2021. Cardiomyopathy and CHF, CAD, Defibrillator and                 Prior CABG, PAD, Arrythmias:Tachycardia, Signs/Symptoms:Dyspnea                 and Shortness of Breath; Risk Factors:Dyslipidemia and Current                 Smoker. Kidney Cancer.  Sonographer:    Ronny Flurry Referring Phys:  [id number] TIMOTHY S OPYD  Sonographer Comments: Image acquisition challenging due to patient body habitus. IMPRESSIONS  1. No left ventricular thrombus is seen. Left ventricular ejection fraction, by estimation, is 30 to 35%. The left ventricle has moderately decreased function. The left  ventricle demonstrates regional wall motion abnormalities (see scoring diagram/findings for description). Left ventricular diastolic parameters are consistent with Grade I diastolic dysfunction (impaired relaxation). Basal inferoposterior dyskinesis/aneurysm is present. The basal anterior wall and anterior septum are hypokinetic, although the apex contracts normally, consistent with high grade stenosis or occlusion of the native coronary with patent distal bypass graft to the LAD artery.  2. Right ventricular systolic function is mildly reduced. The right ventricular size is normal.  3. The liver appears small. There is intestine anterior to the liver in the subcostal views.  4. The mitral valve is normal in structure. Mild mitral valve regurgitation.  5. There is partial right-left aortic cusp fusion with an incomplete raphe. The aortic valve is bicuspid. There is mild calcification of the aortic valve. There is mild thickening of the aortic valve. Aortic valve regurgitation is mild to moderate. Aortic valve sclerosis is present, with no evidence of aortic valve stenosis.  6. Aortic dilatation noted. There is borderline dilatation of the aortic root, measuring 38 mm. Comparison(s): The left ventricular function is worsened. The left ventricular wall motion worse. There is worsened function in the basal anterior wall, with preserved apical contractility. 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 Conclusion(s)/Recommendation(s): If cardioembolic stroke is strongly suspected, recommend repeat imaging with Definity contrast. FINDINGS  Left Ventricle: No left ventricular thrombus is seen. Left ventricular ejection fraction, by estimation, is 30 to 35%. The left ventricle has moderately decreased function. The left ventricle demonstrates regional wall motion abnormalities. The left ventricular internal cavity size was normal in size. There is no left ventricular hypertrophy. Left ventricular diastolic parameters are  consistent with Grade I diastolic dysfunction (impaired relaxation). Normal left ventricular filling pressure.  LV Wall Scoring: The inferior wall and mid inferolateral segment are aneurysmal. The basal inferolateral segment is akinetic. The anterior wall, antero-lateral wall, mid inferoseptal segment, and basal inferoseptal segment are hypokinetic. The entire anterior septum and entire apex are normal. Basal inferoposterior dyskinesis/aneurysm is present. The basal anterior wall and anterior septum are hypokinetic, although the apex contracts normally, consistent with high grade stenosis or occlusion of the native coronary with patent distal bypass graft to the LAD artery. Right Ventricle: The right ventricular size is normal. No increase in right ventricular wall thickness. Right ventricular systolic function is mildly reduced. Left Atrium: Left atrial size was normal in size. Right Atrium: Right atrial size was normal in size. Pericardium: The liver appears small. There is intestine anterior to the liver in the subcostal views. There is no evidence of pericardial effusion. Mitral Valve: The mitral valve is normal in structure. Mild mitral valve regurgitation, with eccentric posteriorly directed jet. Tricuspid Valve: The tricuspid valve is normal in structure. Tricuspid valve regurgitation is mild. Aortic Valve: There is partial right-left aortic cusp fusion with an incomplete raphe. The aortic valve is bicuspid. There is mild calcification of the aortic valve. There is mild thickening of the aortic valve. Aortic valve regurgitation is mild to moderate. Aortic valve sclerosis is present, with no evidence of aortic valve stenosis. Aortic valve mean gradient measures 2.5 mmHg. Aortic valve peak gradient measures 5.0 mmHg. Aortic valve area, by VTI measures 2.47 cm. Pulmonic Valve: The pulmonic valve was normal in structure. Pulmonic valve regurgitation  is trivial. Aorta: Aortic dilatation noted. There is borderline  dilatation of the aortic root, measuring 38 mm. IAS/Shunts: No atrial level shunt detected by color flow Doppler. Additional Comments: A device lead is visualized.  LEFT VENTRICLE PLAX 2D LVIDd:         4.20 cm   Diastology LVIDs:         3.60 cm   LV e' medial:  4.22 cm/s LV PW:         0.85 cm   LV e' lateral: 4.30 cm/s LV IVS:        0.90 cm LVOT diam:     2.30 cm LV SV:         51 LV SV Index:   37 LVOT Area:     4.15 cm  RIGHT VENTRICLE RV S prime:     6.81 cm/s TAPSE (M-mode): 0.9 cm LEFT ATRIUM         Index LA diam:    2.60 cm 1.87 cm/m  AORTIC VALVE AV Area (Vmax):    2.69 cm AV Area (Vmean):   2.53 cm AV Area (VTI):     2.47 cm AV Vmax:           112.00 cm/s AV Vmean:          73.600 cm/s AV VTI:            0.207 m AV Peak Grad:      5.0 mmHg AV Mean Grad:      2.5 mmHg LVOT Vmax:         72.40 cm/s LVOT Vmean:        44.850 cm/s LVOT VTI:          0.123 m LVOT/AV VTI ratio: 0.60  AORTA Ao Root diam: 3.80 cm Ao Asc diam:  3.60 cm TRICUSPID VALVE TR Peak grad:   12.2 mmHg TR Vmax:        175.00 cm/s  SHUNTS Systemic VTI:  0.12 m Systemic Diam: 2.30 cm Mihai Croitoru MD Electronically signed by Sanda Klein MD Signature Date/Time: 04/09/2022/2:26:17 PM    Final    CT ABDOMEN PELVIS W CONTRAST  Result Date: 04/09/2022 CLINICAL DATA:  Abdominal pain, acute, nonlocalized abdominal pain, unintentional weight loss EXAM: CT ABDOMEN AND PELVIS WITH CONTRAST TECHNIQUE: Multidetector CT imaging of the abdomen and pelvis was performed using the standard protocol following bolus administration of intravenous contrast. RADIATION DOSE REDUCTION: This exam was performed according to the departmental dose-optimization program which includes automated exposure control, adjustment of the mA and/or kV according to patient size and/or use of iterative reconstruction technique. CONTRAST:  74mL OMNIPAQUE IOHEXOL 350 MG/ML SOLN COMPARISON:  CT angiography chest 03/08/2021, CT abdomen pelvis 11/22/2012 FINDINGS: Lower  chest: Emphysematous changes. Interval development of scattered pulmonary nodules and subcentimeter cavitary lesions. As an example a 0.4 cm right middle lobe pulmonary nodule (5:14) and a left lower lobe 1.2 by 0.9 cm cavitary lesion (5:16). Partially visualized cardiac defibrillator lead. Hepatobiliary: No focal liver abnormality. No gallstones, gallbladder wall thickening, or pericholecystic fluid. No biliary dilatation. Pancreas: Markedly diffusely atrophic with interval development of an irregular enhancing and cystic mass measuring approximately 5 cm best visualized on coronal imaging (6:34) along the pancreatic body. No surrounding inflammatory changes. Likely main pancreatic duct dilatation associated with the mass given fluid density tubular like appearance of the pancreas just proximal to the mass. Spleen: Normal in size. Nonspecific 1.6 cm hypodense lesion. Nonspecific subcentimeter splenic lesion. Adrenals/Urinary Tract: No adrenal nodule  bilaterally. Bilateral kidneys enhance symmetrically. Subcentimeter hypodensities too small characterize-no further follow-up indicated. No hydronephrosis. No hydroureter. The urinary bladder is unremarkable. Stomach/Bowel: Stomach is within normal limits. No evidence of bowel wall thickening or dilatation. Appendix appears normal. 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 Patent right external iliac artery stent. Severe calcified and noncalcified atherosclerotic plaque of the aorta and its branches. No abdominal, pelvic, or inguinal lymphadenopathy. Reproductive: Other: Right upper lobe peritoneal fat stranding and nodularity with as an example a 0.6 x 0.6 cm nodule (3:17). No intraperitoneal free fluid. No intraperitoneal free gas. No organized fluid collection. Musculoskeletal: No abdominal wall hernia or abnormality. No suspicious lytic or blastic osseous lesions. No acute displaced fracture. Multilevel degenerative  changes of the spine. Levoscoliosis thoracolumbar spine centered at the L2-L3 level. IMPRESSION: 1. Interval development of peritoneal carcinomatosis with a difficult to visualize irregular cystic and enhancing mass measuring approximately 5 cm along the pancreatic body. Markedly limited evaluation due to lack of intraperitoneal fat. When the patient is clinically stable and able to follow directions and hold their breath (preferably as an outpatient) further evaluation with dedicated abdominal MRI pancreatic protocol should be considered. 2. Scattered pulmonary nodules and subcentimeter cavitary lesions. Recommend CT chest with intravenous contrast for further evaluation. 3. 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 Recommend follow-up ultrasound every 3 years. This recommendation follows ACR consensus guidelines: White Paper of the ACR Incidental Findings Committee II on Vascular Findings. J Am Coll Radiol 2013; 10:789-794. Electronically Signed   By: Iven Finn M.D.   On: 04/09/2022 01:20   CT Head Wo Contrast  Result Date: 04/08/2022 CLINICAL DATA:  Head trauma, minor. Generalized weakness. Multiple falls. EXAM: CT HEAD WITHOUT CONTRAST TECHNIQUE: Contiguous axial images were obtained from the base of the skull through the vertex without intravenous contrast. RADIATION DOSE REDUCTION: This exam was performed according to the departmental dose-optimization program which includes automated exposure control, adjustment of the mA and/or kV according to patient size and/or use of iterative reconstruction technique. COMPARISON:  Head CT 08/14/2020. FINDINGS: Brain: No acute intracranial hemorrhage. Mild chronic small-vessel disease. 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 No hydrocephalus or extra-axial collection. No mass effect or midline shift. Vascular: No hyperdense vessel or unexpected calcification. Skull: No calvarial fracture or suspicious bone lesion.  Skull base is unremarkable. Sinuses/Orbits: Unremarkable. Other: None. IMPRESSION: 1. No acute intracranial hemorrhage. 2. 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 3. Mild chronic small-vessel disease. Electronically Signed   By: Emmit Alexanders M.D.   On: 04/08/2022 18:22   DG Chest 2 View  Result Date: 04/08/2022 CLINICAL DATA:  Generalized weakness. Multiple falls. Infection evaluation. EXAM: CHEST - 2 VIEW COMPARISON:  Chest CT 03/08/2021.  Radiographs 08/14/2020. FINDINGS: The heart size and mediastinal contours are stable status post median sternotomy. Left subclavian AICD lead projects over the right ventricular apex. The lungs appear hyperinflated but clear. There is no pleural effusion or pneumothorax. No acute osseous findings are seen. There are advanced degenerative changes at both shoulders. Chronic fracture of the left humeral neck may be incompletely healed. Grossly stable chronic compression deformities near the thoracolumbar junction. IMPRESSION: No evidence of acute cardiopulmonary process. Chronic obstructive pulmonary disease. Electronically Signed   By: Richardean Sale M.D.   On: 04/08/2022 18:17   CUP PACEART REMOTE DEVICE CHECK  Result Date: 03/14/2022 Scheduled remote reviewed. Normal device function.  Battery estimated 12% - route to triage Next remote to  be determined LA   Labs:  CBC: Recent Labs    03/19/22 1230 04/08/22 2117 04/09/22 0426 04/10/22 0701  WBC 13.6* 8.7 8.6 8.9  HGB 12.9* 13.7 13.1 14.3  HCT 38.7* 39.8 38.2* 40.9  PLT 292.0 235 231 190    COAGS: No results for input(s): "INR", "APTT" in the last 8760 hours.  BMP: Recent Labs    12/18/21 0306 03/19/22 1230 04/08/22 2117 04/08/22 2349 04/10/22 0701  NA 141 139 136 138 139  K 3.8 4.3 4.3 4.0 3.9  CL 106 101 96* 99 100  CO2 24 29 25 23 27   GLUCOSE 81 108* 117* 103* 122*  BUN 13 21 49* 50* 44*  CALCIUM 9.4 9.9 9.4 9.4 9.1  CREATININE 1.02 1.08 1.14 1.07 0.92  GFRNONAA >60   --  >60 >60 >60    LIVER FUNCTION TESTS: Recent Labs    03/19/22 1230 04/08/22 2117 04/08/22 2349 04/10/22 0701  BILITOT 1.0 1.7* QUANTITY NOT SUFFICIENT, UNABLE TO PERFORM TEST 1.5*  AST 22 34 QUANTITY NOT SUFFICIENT, UNABLE TO PERFORM TEST 26  ALT 17 26 QUANTITY NOT SUFFICIENT, UNABLE TO PERFORM TEST 24  ALKPHOS 116 76 85 78  PROT 6.6 6.0* 6.5 6.0*  ALBUMIN 3.5 3.4* 3.6 3.3*    TUMOR MARKERS: No results for input(s): "AFPTM", "CEA", "CA199", "CHROMGRNA" in the last 8760 hours.  Assessment and Plan: Pancreatic mass Peritoneal carcinomatosis Patient with new diagnosis of pancreatic mass, peritoneal carcinomatosis. Request for biopsy per Oncology.  Case reviewed and approved by Dr. Anselm Pancoast.  NPO p MN.  LD Plavix at home was 3/24.   Risks and benefits was discussed with the patient and/or patient's family including, but not limited to bleeding, infection, damage to adjacent structures or low yield requiring additional tests.  All of the questions were answered and there is agreement to proceed.  Consent signed and in chart.  Thank you for this interesting consult.  I greatly enjoyed meeting Reis Tabora and look forward to participating in their care.  A copy of this report was sent to the requesting provider on this date.  Electronically Signed: Docia Barrier, PA 04/10/2022, 3:53 PM   I spent a total of 20 Minutes    in face to face in clinical consultation, greater than 50% of which was counseling/coordinating care for pancreatic mass, carcinomatosis.

## 2022-04-10 NOTE — Progress Notes (Signed)
ANTICOAGULATION CONSULT NOTE  Pharmacy Consult for heparin  Indication:  suspected PE   No Known Allergies  Patient Measurements: Height: 5\' 7"  (170.2 cm) Weight: 37.5 kg (82 lb 10.8 oz) IBW/kg (Calculated) : 66.1 Heparin Dosing Weight: 40.8kg   Vital Signs: Temp: 98.1 F (36.7 C) (03/27 0823) Temp Source: Oral (03/27 0823) BP: 107/88 (03/27 0823) Pulse Rate: 72 (03/27 0823)  Labs: Recent Labs    04/08/22 2117 04/08/22 2349 04/09/22 0426 04/09/22 2135 04/10/22 0701  HGB 13.7  --  13.1  --  14.3  HCT 39.8  --  38.2*  --  40.9  PLT 235  --  231  --  190  HEPARINUNFRC  --   --   --  0.21* 0.37  CREATININE 1.14 1.07  --   --  0.92  CKTOTAL  --  140  --   --   --   TROPONINIHS 110* 102*  --   --   --      Estimated Creatinine Clearance: 40.2 mL/min (by C-G formula based on SCr of 0.92 mg/dL).   Assessment: Patient admitted with CC of weakness and multiple falls. Found to have age-indeterminate infarct in left thalamic. MRI recommended by neurology (not yet completed). CTA negative for PE.  Pharmacy consulted to dose heparin.   Heparin level therapeutic at 0.37 units/mL; no bleeding reported; CBC stable.  Goal of Therapy:  Anti-Xa 0.3-0.5 for now pending MRI results  Monitor platelets by anticoagulation protocol: Yes   Plan:  No bolus given potential for acute infarct Continue heparin infusion at 600 units/hr Daily heparin level and CBC F/u MRI results, [facility] plans/indication  Emerita Berkemeier D. Mina Marble, PharmD, BCPS, [place] 04/10/2022, 2:07 PM

## 2022-04-11 ENCOUNTER — Inpatient Hospital Stay (HOSPITAL_COMMUNITY): Payer: Medicare Other

## 2022-04-11 ENCOUNTER — Ambulatory Visit (INDEPENDENT_AMBULATORY_CARE_PROVIDER_SITE_OTHER): Payer: Federal, State, Local not specified - PPO

## 2022-04-11 DIAGNOSIS — K862 Cyst of pancreas: Secondary | ICD-10-CM

## 2022-04-11 DIAGNOSIS — Z7189 Other specified counseling: Secondary | ICD-10-CM | POA: Diagnosis not present

## 2022-04-11 DIAGNOSIS — C786 Secondary malignant neoplasm of retroperitoneum and peritoneum: Secondary | ICD-10-CM | POA: Diagnosis not present

## 2022-04-11 DIAGNOSIS — I255 Ischemic cardiomyopathy: Secondary | ICD-10-CM

## 2022-04-11 DIAGNOSIS — R627 Adult failure to thrive: Secondary | ICD-10-CM | POA: Diagnosis not present

## 2022-04-11 LAB — CUP PACEART REMOTE DEVICE CHECK
Date Time Interrogation Session: 20240328084853
Implantable Lead Connection Status: 753985
Implantable Lead Implant Date: 20141124
Implantable Lead Location: 753860
Implantable Lead Model: 365
Implantable Lead Serial Number: 10552343
Implantable Pulse Generator Implant Date: 20141124
Pulse Gen Model: 383594
Pulse Gen Serial Number: 60740583

## 2022-04-11 LAB — COMPREHENSIVE METABOLIC PANEL
ALT: 22 U/L (ref 0–44)
AST: 25 U/L (ref 15–41)
Albumin: 3.1 g/dL — ABNORMAL LOW (ref 3.5–5.0)
Alkaline Phosphatase: 76 U/L (ref 38–126)
Anion gap: 11 (ref 5–15)
BUN: 42 mg/dL — ABNORMAL HIGH (ref 8–23)
CO2: 22 mmol/L (ref 22–32)
Calcium: 8.6 mg/dL — ABNORMAL LOW (ref 8.9–10.3)
Chloride: 102 mmol/L (ref 98–111)
Creatinine, Ser: 0.83 mg/dL (ref 0.61–1.24)
GFR, Estimated: >60 mL/min (ref >60)
Glucose, Bld: 189 mg/dL — ABNORMAL HIGH (ref 70–99)
Potassium: 3.9 mmol/L (ref 3.5–5.1)
Sodium: 135 mmol/L (ref 135–145)
Total Bilirubin: 1.1 mg/dL (ref 0.3–1.2)
Total Protein: 5.4 g/dL — ABNORMAL LOW (ref 6.5–8.1)

## 2022-04-11 LAB — CBC
HCT: 39.9 % (ref 39.0–52.0)
Hemoglobin: 13.2 g/dL (ref 13.0–17.0)
MCH: 31.1 pg (ref 26.0–34.0)
MCHC: 33.1 g/dL (ref 30.0–36.0)
MCV: 94.1 fL (ref 80.0–100.0)
Platelets: 194 10*3/uL (ref 150–400)
RBC: 4.24 MIL/uL (ref 4.22–5.81)
RDW: 15.7 % — ABNORMAL HIGH (ref 11.5–15.5)
WBC: 10.3 10*3/uL (ref 4.0–10.5)
nRBC: 0 % (ref 0.0–0.2)

## 2022-04-11 LAB — PROTIME-INR
INR: 1.2 (ref 0.8–1.2)
Prothrombin Time: 15.3 seconds — ABNORMAL HIGH (ref 11.4–15.2)

## 2022-04-11 LAB — HEPARIN LEVEL (UNFRACTIONATED): Heparin Unfractionated: <0.1 IU/mL — ABNORMAL LOW (ref 0.30–0.70)

## 2022-04-11 LAB — CEA: CEA: 9.6 ng/mL — ABNORMAL HIGH (ref 0.0–4.7)

## 2022-04-11 MED ORDER — ENOXAPARIN SODIUM 300 MG/3ML IJ SOLN
20.0000 mg | Freq: Every day | INTRAMUSCULAR | Status: DC
  Administered 2022-04-11 – 2022-04-12 (×2): 20 mg via SUBCUTANEOUS
  Filled 2022-04-11 (×4): qty 0.2

## 2022-04-11 MED ORDER — PANCRELIPASE (LIP-PROT-AMYL) 12000-38000 UNITS PO CPEP
24000.0000 [IU] | ORAL_CAPSULE | Freq: Three times a day (TID) | ORAL | Status: DC
  Administered 2022-04-11 – 2022-04-13 (×4): 24000 [IU] via ORAL
  Filled 2022-04-11 (×4): qty 2

## 2022-04-11 MED ORDER — OXYCODONE HCL ER 10 MG PO T12A
10.0000 mg | EXTENDED_RELEASE_TABLET | Freq: Two times a day (BID) | ORAL | Status: DC
  Administered 2022-04-11 – 2022-04-12 (×2): 10 mg via ORAL
  Filled 2022-04-11 (×2): qty 1

## 2022-04-11 MED ORDER — MELATONIN 3 MG PO TABS
3.0000 mg | ORAL_TABLET | Freq: Every day | ORAL | Status: DC
  Administered 2022-04-11 – 2022-04-12 (×2): 3 mg via ORAL
  Filled 2022-04-11 (×3): qty 1

## 2022-04-11 MED ORDER — MELATONIN 3 MG PO TABS: 3.0000 mg | ORAL_TABLET | Freq: Every day | ORAL | Status: DC

## 2022-04-11 MED ORDER — LORAZEPAM 0.5 MG PO TABS: 0.2500 mg | ORAL_TABLET | ORAL | Status: DC | PRN

## 2022-04-11 MED ORDER — FENTANYL CITRATE (PF) 100 MCG/2ML IJ SOLN
INTRAMUSCULAR | Status: AC
  Filled 2022-04-11: qty 2

## 2022-04-11 MED ORDER — MIDAZOLAM HCL 2 MG/2ML IJ SOLN
INTRAMUSCULAR | Status: AC
  Filled 2022-04-11: qty 2

## 2022-04-11 MED ORDER — LORAZEPAM 0.5 MG PO TABS
0.5000 mg | ORAL_TABLET | Freq: Every day | ORAL | Status: DC
  Administered 2022-04-11: 0.5 mg via ORAL
  Filled 2022-04-11: qty 1

## 2022-04-11 MED ORDER — LIDOCAINE HCL (PF) 1 % IJ SOLN
6.0000 mL | Freq: Once | INTRAMUSCULAR | Status: AC
  Administered 2022-04-11: 6 mL via INTRADERMAL

## 2022-04-11 MED ORDER — ONDANSETRON HCL 4 MG/2ML IJ SOLN: 4.0000 mg | Freq: Four times a day (QID) | INTRAMUSCULAR | Status: DC | PRN

## 2022-04-11 NOTE — Progress Notes (Signed)
Physical Therapy Treatment Patient Details Name: Hector Byrd MRN: [id number] DOB: [birth year] Today's Date: 04/11/2022   History of Present Illness 67 y.o. male admitted 3/25 with progressive generalized weakness and multiple falls, Failure to thrive. PMHX: depression, CAD, PAD, cardiomyopathy with ICD, ascending aortic dilatation, and recent abdominal pain, loss of appetite, and weight loss.    PT Comments    Pt eager to work with therapy and hopeful to get OOB. Assisted to EOB with min assist and for sit to stand + stand pivot transfer min assist for balance, VC for sequencing with RW and steps. Declines gait. Comfortable in chair, pillow provided for pressure relief, would limit to <1.5 hr, Prevalon boots applied bil. Further recs pending biopsy and prognosis. Pt lives alone with limited support. Higher level care recommended.Patient will continue to benefit from skilled physical therapy services to further improve independence with functional mobility.    Recommendations for follow up therapy are one component of a multi-disciplinary discharge planning process, led by the attending physician.  Recommendations may be updated based on patient status, additional functional criteria and insurance authorization.  Follow Up Recommendations  Can patient physically be transported by private vehicle: Yes    Assistance Recommended at Discharge Frequent or constant Supervision/Assistance  Patient can return home with the following A little help with walking and/or transfers;A lot of help with bathing/dressing/bathroom;Assistance with cooking/housework;Assist for transportation;Help with stairs or ramp for entrance   Equipment Recommendations   (Pending hospice or palliative discussions for placement.)    Recommendations for Other Services       Precautions / Restrictions Precautions Precautions: Fall Restrictions Weight Bearing Restrictions: No     Mobility  Bed Mobility Overal bed  mobility: Needs Assistance Bed Mobility: Supine to Sit     Supine to sit: Min assist     General bed mobility comments: Min assist for trunk and LE support to rise to EOB, cues for rail use as needed.    Transfers Overall transfer level: Needs assistance Equipment used: Rolling walker (2 wheels) Transfers: Sit to/from Stand, Bed to chair/wheelchair/BSC Sit to Stand: Min assist   Step pivot transfers: Min assist       General transfer comment: Min assist for boost to stand, cues for hand placement. Performed from bed and recliner, showing posterior lean. Min assist for balance (posterior lean) with step pivot transfer to recliner. Cues for technique and sequencing throughout.    Ambulation/Gait               General Gait Details: declined   Marine scientist Rankin (Stroke Patients Only)       Balance Overall balance assessment: Needs assistance Sitting-balance support: No upper extremity supported, Feet supported Sitting balance-Leahy Scale: Fair     Standing balance support: Reliant on assistive device for balance, Bilateral upper extremity supported, During functional activity Standing balance-Leahy Scale: Poor                              Cognition Arousal/Alertness: Awake/alert Behavior During Therapy: Flat affect Overall Cognitive Status: Within Functional Limits for tasks assessed                                          Exercises  General Comments General comments (skin integrity, edema, etc.): Pillow in chair for pressure relief.      Pertinent Vitals/Pain Pain Assessment Pain Assessment: Faces Faces Pain Scale: Hurts little more Pain Location: bladder area Pain Descriptors / Indicators: Sore Pain Intervention(s): Monitored during session, Repositioned    Home Living                          Prior Function            PT Goals (current goals can  now be found in the care plan section) Acute Rehab PT Goals Patient Stated Goal: Get well PT Goal Formulation: With patient Time For Goal Achievement: 04/23/22 Potential to Achieve Goals: Good Progress towards PT goals: Progressing toward goals    Frequency    Min 3X/week      PT Plan Current plan remains appropriate    Co-evaluation              AM-PAC PT "6 Clicks" Mobility   Outcome Measure  Help needed turning from your back to your side while in a flat bed without using bedrails?: A Little Help needed moving from lying on your back to sitting on the side of a flat bed without using bedrails?: A Little Help needed moving to and from a bed to a chair (including a wheelchair)?: A Little Help needed standing up from a chair using your arms (e.g., wheelchair or bedside chair)?: A Little Help needed to walk in hospital room?: A Little Help needed climbing 3-5 steps with a railing? : A Lot 6 Click Score: 17    End of Session Equipment Utilized During Treatment: Gait belt Activity Tolerance: Patient tolerated treatment well Patient left: in chair;with call bell/phone within reach;with chair alarm set;with nursing/sitter in room (Prevalon boots applied) Nurse Communication: Mobility status PT Visit Diagnosis: Unsteadiness on feet (R26.81);Repeated falls (R29.6);Muscle weakness (generalized) (M62.81);History of falling (Z91.81);Difficulty in walking, not elsewhere classified (R26.2);Adult, failure to thrive (R62.7);Pain Pain - Right/Left:  (central) Pain - part of body:  (bladder area)     Time: 1535-1550 PT Time Calculation (min) (ACUTE ONLY): 15 min  Charges:  $Therapeutic Activity: 8-22 mins                     Candie Mile, PT, DPT Physical Therapist Acute [facility] Services [place]    Ellouise Newer 04/11/2022, 4:14 PM

## 2022-04-11 NOTE — Progress Notes (Signed)
IP PROGRESS NOTE  Subjective:   Hector Byrd continues to have abdominal pain.  He has diarrhea after drinking.  Objective: Vital signs in last 24 hours: Blood pressure 104/65, pulse 75, temperature 97.8 F (36.6 C), temperature source Oral, resp. rate 13, height 5\' 7"  (1.702 m), weight 82 lb 10.8 oz (37.5 kg), SpO2 99 %.  Intake/Output from previous day: 03/27 0701 - 03/28 0700 In: 840 [P.O.:840] Out: 1100 [Urine:1100]  Physical Exam:  HEENT: White coat over the tongue, no buccal thrush Abdomen: Soft, no mass Extremities: Trace ankle edema bilaterally Skin: Multiple ecchymoses, gauze dressing covering the right lower leg burn  Portacath/PICC-without erythema  Lab Results: Recent Labs    04/10/22 0701 04/11/22 0315  WBC 8.9 10.3  HGB 14.3 13.2  HCT 40.9 39.9  PLT 190 194    BMET Recent Labs    04/10/22 0701 04/11/22 0315  NA 139 135  K 3.9 3.9  CL 100 102  CO2 27 22  GLUCOSE 122* 189*  BUN 44* 42*  CREATININE 0.92 0.83  CALCIUM 9.1 8.6*    Lab Results  Component Value Date   CEA1 9.6 (H) 04/10/2022    Studies/Results: CUP PACEART REMOTE DEVICE CHECK  Result Date: 04/11/2022 Scheduled non-billable remote reviewed. Normal device function.  Battery estimated 11% Next remote 4/29 LA, CVRS  CT Angio Chest Pulmonary Embolism (PE) W or WO Contrast  Result Date: 04/09/2022 CLINICAL DATA:  Chest pain tachycardia EXAM: CT ANGIOGRAPHY CHEST WITH CONTRAST TECHNIQUE: Multidetector CT imaging of the chest was performed using the standard protocol during bolus administration of intravenous contrast. Multiplanar CT image reconstructions and MIPs were obtained to evaluate the vascular anatomy. RADIATION DOSE REDUCTION: This exam was performed according to the departmental dose-optimization program which includes automated exposure control, adjustment of the mA and/or kV according to patient size and/or use of iterative reconstruction technique. CONTRAST:  42mL OMNIPAQUE  IOHEXOL 350 MG/ML SOLN COMPARISON:  CT 04/09/2022, chest CT 03/08/2021 FINDINGS: Cardiovascular: Satisfactory opacification of the pulmonary arteries to the segmental level. No evidence of pulmonary embolism. Moderate aortic atherosclerosis. Coronary vascular calcification. Normal cardiac size. No pericardial effusion. Mediastinum/Nodes: Midline trachea. No thyroid mass. No suspicious lymph nodes. Esophagus within normal limits. Lungs/Pleura: Emphysema. Numerous bilateral pulmonary nodules, several of which demonstrate central cavitation, the largest is seen in the peripheral left upper lobe on series 6, image 51 and measures 2 by 1.9 cm. Upper Abdomen: See separately dictated CT abdomen report Musculoskeletal: Post sternotomy changes. No acute or suspicious osseous abnormality. Review of the MIP images confirms the above findings. IMPRESSION: 1. Negative for acute pulmonary embolus. 2. Emphysema. Numerous bilateral pulmonary nodules, several of which demonstrate central cavitation. Findings could be secondary to septic emboli, metastatic disease, or atypical infection. 3. Aortic atherosclerosis. Aortic Atherosclerosis (ICD10-I70.0) and Emphysema (ICD10-J43.9). Electronically Signed   By: Donavan Foil M.D.   On: 04/09/2022 18:53   CT HEAD WO CONTRAST (5MM)  Result Date: 04/09/2022 CLINICAL DATA:  Stroke follow-up.  Unable to do brain MRI due to ICD EXAM: CT HEAD WITHOUT CONTRAST TECHNIQUE: Contiguous axial images were obtained from the base of the skull through the vertex without intravenous contrast. RADIATION DOSE REDUCTION: This exam was performed according to the departmental dose-optimization program which includes automated exposure control, adjustment of the mA and/or kV according to patient size and/or use of iterative reconstruction technique. COMPARISON:  Yesterday FINDINGS: Brain: Indistinct left thalamic infarct, again age indeterminate. No acute hemorrhage or visible cortex infarct. No mass,  hydrocephalus, or  collection. Vascular: No hyperdense vessel or unexpected calcification. Skull: No acute or aggressive finding. Osteoma at the right mandibular notch. Cachectic appearance of the upper cervical soft tissues. Sinuses/Orbits: Negative IMPRESSION: Unchanged appearance of the age indeterminate lacunar infarct at the left thalamus. No new abnormality including intracranial hemorrhage. Electronically Signed   By: Jorje Guild M.D.   On: 04/09/2022 18:48    Medications: I have reviewed the patient's current medications.  Assessment/Plan:  Pancreas mass, peritoneal nodules, lung nodules Anorexia/weight loss Abdominal pain secondary to #1 COPD Multiple falls with scattered ecchymoses CAD CHF Ventricular tachycardia, AICD in place Age-indeterminate left thalamic infarct on CT 04/08/2022    Hector Byrd appears unchanged.  He is scheduled for a peritoneal biopsy today.  He appears cachectic and deconditioned.  He has multiple comorbid conditions.  I will recommend hospice care if the biopsy is consistent with metastatic pancreas cancer.  Hector Byrd will likely not be able to return to living alone.  The diarrhea may be related to pancreatic insufficiency.  Recommendations: Proceed with diagnostic biopsy of a peritoneal nodule Narcotic pain control Placement with hospice if a diagnosis of pancreas cancer is confirmed Trial of Creon for diarrhea  LOS: 3 days   Betsy Coder, MD   04/11/2022, 2:19 PM

## 2022-04-11 NOTE — Sedation Documentation (Signed)
Time out was 10:01

## 2022-04-11 NOTE — Procedures (Signed)
  Interventional Radiology Procedure:   Indications: Probable metastatic disease and needs a diagnosis  Procedure: US guided paracentesis  Findings: Removed 90 ml from right abdomen.   Soft tissue floating in perihepatic space is concerning for peritoneal disease.    Complications: None     EBL: Less than 10 ml   Jason Frisbee R. Anselm Pancoast, MD  Pager: [phone]

## 2022-04-11 NOTE — Social Work (Signed)
TOC continues to follow for disposition and DC needs. Will follow Palliative and medical team for plan of care and next steps.

## 2022-04-11 NOTE — Progress Notes (Addendum)
PROGRESS NOTE    Hector Byrd  [id number] DOB: 12-14-1955 DOA: 04/08/2022 PCP: Orma Render, NP     Brief Narrative:    depression, CAD, PAD, cardiomyopathy with ICD, ascending aortic dilatation, and recent abdominal pain, loss of appetite, and weight loss presents to the emergency department with progressive generalized weakness and multiple falls.   Subjective:  He is seen after returned from ultrasound-guided paracentesis He think xaxan is too strong, he would like to try ativan, also agreed with melatonin for insomnia He reports ab pain , also reports feeling not able to empty bladder He reports feeling nauseous, no vomiting He reports has loose stool, denies incontinence   He desires to have biopsy    Assessment & Plan:  Principal Problem:   Failure to thrive in adult Active Problems:   CAD (coronary artery disease)   Emphysema lung (HCC)   Chronic combined systolic (congestive) and diastolic (congestive) heart failure (HCC)   Ascending aorta dilatation (HCC)   Elevated troponin   Left thalamic infarction ([place])   Abdominal pain    Assessment and Plan:  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   Soft tissue floating in perihepatic space is concerning for peritoneal disease.  "  Follow-up on cytology results  Chest pain with tachyardia, mild elevation of troponin Seen by cardiology , currently on Lipitor, Plavix, Lopressor  echo lvef [id number], grade 1 diastolic dysfunction  CTA chest no PE , .  Chronic combined  chf Echo 99991111, grade 1 diastolic dysfunction  Does not appear volume overloaded Received gentle hydration Close monitor volume  status Cardiology signed off, continue goals of care discussion   Left thalamic infarction Not able to do MRI due to history of ICD Repeat CT head" Unchanged appearance of the age indeterminate lacunar infarct at the left thalamus. No new abnormality including intracranial hemorrhage."  H/o PVD, chronic right lower extremity wound Seen by wound care  Significant weight loss /FTT/poor prognosis Palliative care consulted for goals of care, DNR, likely will benefit from hospice Appreciate palliative care help in Symptom control  Report ab pain, and feeling not able to empty bladder Per RN bladder scan 9cc Pain control    Nutritional Assessment: The patient's BMI is: Body mass index is 12.95 kg/m.Marland Kitchen Seen by dietician.  I agree with the assessment and plan as outlined below: Nutrition Status: Nutrition Problem: Inadequate oral intake Etiology: acute illness, chronic illness (FTT) Signs/Symptoms: other (comment), percent weight loss (BMI 12.96, per chart review) Percent weight loss: 38.4 % Interventions: Ensure Enlive (each supplement provides 350kcal and 20 grams of protein), MVI  .        Skin Assessment: I have examined the patient's skin and I agree with the wound assessment as performed by the wound care RN as outlined below: Pressure Injury 04/08/22 Pretibial Right Large scabbed wound on outside of right leg. (Active)  04/08/22 2100  Location: Pretibial  Location Orientation: Right  Staging:   Wound Description (Comments): Large scabbed wound on outside of right leg.  Present on Admission: Yes  Dressing Type Gauze (Comment);Honey;Petroleum;Non adherent;Tape dressing 04/10/22 0939     Pressure Injury Heel Left Stage 1 -  Intact skin with non-blanchable redness of a localized area usually  over a bony prominence. (Active)     Location: Heel  Location Orientation: Left  Staging: Stage 1 -  Intact skin with non-blanchable redness of a localized area usually over a  bony prominence.  Wound Description (Comments):   Present on Admission: Yes  Dressing Type Foam - Lift dressing to assess site every shift 04/10/22 0939     Pressure Injury 04/09/22 Vertebral column Lower;Mid Unstageable - Full thickness tissue loss in which the base of the injury is covered by slough (yellow, tan, gray, green or brown) and/or eschar (tan, brown or black) in the wound bed. 0.5 x 0.5 100% yel (Active)  04/09/22 1100  Location: Vertebral column  Location Orientation: Lower;Mid  Staging: Unstageable - Full thickness tissue loss in which the base of the injury is covered by slough (yellow, tan, gray, green or brown) and/or eschar (tan, brown or black) in the wound bed.  Wound Description (Comments): 0.5 x 0.5 100% yellow slough  Present on Admission: Yes  Dressing Type Foam - Lift dressing to assess site every shift 04/10/22 0939   Seen by wound care  I have Reviewed nursing notes, Vitals, pain scores, I/o's, Lab results and  imaging results since pt's last encounter, details please see discussion above  I ordered the following labs:  Unresulted Labs (From admission, onward)     Start     Ordered   04/15/22 0500  Creatinine, serum  (enoxaparin (LOVENOX)    CrCl >/= 30 ml/min)  Weekly,   R     Comments: while on enoxaparin therapy    04/08/22 2309   04/10/22 0500  Cancer antigen 19-9  Tomorrow morning,   R        04/09/22 1354   04/08/22 1732  Rapid urine drug screen (hospital performed)  ONCE - STAT,   STAT        04/08/22 1731   04/08/22 1732  Urinalysis, Routine w reflex microscopic -Urine, Clean Catch  Once,   URGENT       Question:  Specimen Source  Answer:  Urine, Clean Catch   04/08/22 1731             DVT prophylaxis: lovneox    Code Status:   Code Status: DNR  Family Communication: sister over the phone on 3/26 and 3/27 Disposition:   Dispo: The patient is from: home, lives by himself, next of kin is sister              Anticipated d/c is to:  TBD              Anticipated d/c date is: >24hrs  Antimicrobials:    Anti-infectives (From admission, onward)    None          Objective: Vitals:   04/11/22 1001 04/11/22 1010 04/11/22 1020 04/11/22 1555  BP: 109/72 116/71 104/65 114/75  Pulse:  78 75 72  Resp: 17 13 13 15   Temp:    97.9 F (36.6 C)  TempSrc:    Oral  SpO2: 96% 94% 99% 95%  Weight:      Height:        Intake/Output Summary (Last 24 hours) at 04/11/2022 1601 Last data filed at 04/11/2022 1300 Gross per 24 hour  Intake 840 ml  Output 1490 ml  Net -650 ml   Filed Weights   04/09/22 0028 04/09/22 0232  Weight: 40.8 kg 37.5 kg    Examination:  General exam: very fail, weak, cachectic, oriented x3 Respiratory system:  Clear to auscultation. Respiratory effort normal. Cardiovascular system:  sinus tachycardia  Gastrointestinal system: Abdomen is nondistended, soft and nontender.  Normal bowel sounds heard. Central nervous system: Alert and oriented. No focal neurological deficits. Extremities:  right leg wound now wrapped,  Skin: right leg wound, unstageable pressure injury low back above sacrum Psychiatry: flat affect    Data Reviewed: I have personally reviewed  labs and visualized  imaging studies since the last encounter and formulate the plan        Scheduled Meds:  atorvastatin  80 mg Oral Daily   enoxaparin (LOVENOX) injection  20 mg Subcutaneous QHS   feeding supplement  237 mL Oral TID BM   fentaNYL       lamoTRIgine  100 mg Oral BID   leptospermum manuka honey  1 Application Topical Daily   lipase/protease/amylase  24,000 Units Oral TID AC   LORazepam  0.5 mg Oral QHS   metoprolol succinate  50 mg Oral Daily   midazolam       mirtazapine  7.5 mg Oral QHS   multivitamin with minerals  1 tablet Oral Daily   oxyCODONE  10 mg Oral Q12H   pantoprazole  40 mg Oral Daily   tamsulosin  0.4 mg Oral Daily   thiamine  100 mg Oral Daily   venlafaxine XR  75 mg Oral Daily    Continuous Infusions:     LOS: 3 days   Time spent:  20mins  Florencia Reasons, MD PhD FACP [facility]  Available via Epic secure chat 7am-7pm for nonurgent issues Please page for urgent issues To page the attending provider between 7A-7P or the covering provider during after hours 7P-7A, please log into the web site [email or web address] and access using universal [place] password for that web site. If you do not have the password, please call the hospital operator.    04/11/2022, 4:01 PM

## 2022-04-11 NOTE — Progress Notes (Signed)
PT Cancellation Note  Patient Details Name: Hector Byrd MRN: [id number] DOB: 09/12/1955   Cancelled Treatment:    Reason Eval/Treat Not Completed: Other (comment)  Pt speaking with another team member and family. Requests return at a later time. Will follow-up as schedule permits.   Candie Mile, PT, DPT Physical Therapist Acute Rehabilitation Services [facility] Outpatient [facility]   Ellouise Newer 04/11/2022, 2:46 PM

## 2022-04-11 NOTE — Progress Notes (Addendum)
This chaplain responded to PMT NP-Kasie consult for spiritual care and creating/updating the Pt. Advance Directive. The Pt. is out of the room for a procedure at the time of the visit. This chaplain will attempt a revisit today.   **1310  The chaplain is present at the Pt. bedside. The Pt. confirms an interest in completing an Advance Directive as he picks at his lunch.  The chaplain listens reflectively as the Pt. participates in storytelling with the chaplain. The chaplain connects the stories to the Pt. choice of completing HCPOA. The chaplain understands the Pt. learned of his father's cancer diagnosis with no opportunity for questions and/or family involvement. As the chaplain provides AD education, the Pt. shares he has a sister with whom he has a silent trust and will be his first choice for HCPOA. The chaplain shares the importance of the HCPOA having knowledge of the Pt. health and goals of care. The Pt. is accepting of the suggestion of a three way phone call to update the sister in [place] of the Pt. prognosis after the tests are completed.   The chaplain listens as the Pt. tells the story of his relationship with his 69yo Reita May and the sudden death of Cheyenne's sister. The chaplain understands both of the stories have the possibility of informing the Pt. questions about the certainty/uncertainty of death.  The chaplain understands the Pt. has an interest in F/U spiritual care. The chaplain left AD education in the Pt. room.    Chaplain Sallyanne Kuster [phone]

## 2022-04-11 NOTE — Progress Notes (Signed)
Nutrition Brief Note    RD attempted to follow-up on calorie count. No meal tickets from yesterday in envelope on door. Spoke with RN, RN reports not knowing if pt ate yesterday. Will continue to attempt to complete calorie count tomorrow.   Hermina Barters RD, LDN Clinical Dietitian See Shea Evans for contact information.

## 2022-04-11 NOTE — Progress Notes (Addendum)
Daily Progress Note   Patient Name: Hector Byrd       Date: 04/11/2022 DOB: 04-26-1955  Age: 67 y.o. MRN#: [id number] Attending Physician: Florencia Reasons, MD Primary Care Physician: Orma Render, NP Admit Date: 04/08/2022  Reason for Consultation/Follow-up: Establishing goals of care  Patient Profile/HPI:   67 y.o. male  with past medical history of anxiety and depression, CAD, PAD, cardiomyopathy with ICD, ascending aortic dilatation, abdominal pain with weight loss, admitted on 04/08/2022 with progressive weakness. Workup reveals likely stage IV pancreatic cancer with pancreatic mass, abdominal carcinamatosis, and cavitary lung nodules evident on imaging. Palliative medicine consulted for goals of care.   Subjective: Chart reviewed including labs, progress notes, imaging from this and previous encounters.  Diagnostic paracentesis performed today- path pending.  Patient's sister Hector Byrd has arrived from [place] and is at bedside.  With patient's permission I reviewed his hospital course and prognosis.  Emotional support provided.  We discussed hospice services and philosophy of care. He has an ICD and agrees with deactivation before discharge.  There are concerns about disposition as he is not able to return home to live alone even with hospice support. Could be evaluated for inpatient hospice. He has Medicaid so long term care could possibly be an option.  He continues to desire supportive care and pain control until diagnosis is confirmed.  Anxiety persists- he feels xanax he took last night was too strong.  Pain is still present- current pain medication helps when taken. He requested pain med while I was at bedside. We discussed starting long acting opioid- he is in agreement.   Review of Systems   Constitutional:  Positive for malaise/fatigue and weight loss.     Physical Exam Vitals and nursing note reviewed.  Constitutional:      Comments: cachectic  Musculoskeletal:     Comments: Diffuse muscle wasting  Skin:    Coloration: Skin is jaundiced.             Vital Signs: BP 104/65   Pulse 75   Temp 97.8 F (36.6 C) (Oral)   Resp 13   Ht 5\' 7"  (1.702 m)   Wt 37.5 kg   SpO2 99%   BMI 12.95 kg/m  SpO2: SpO2: 99 % O2 Device: O2 Device: Nasal Cannula O2 Flow Rate: O2 Flow Rate (L/min):  2 L/min  Intake/output summary:  Intake/Output Summary (Last 24 hours) at 04/11/2022 1531 Last data filed at 04/11/2022 1300 Gross per 24 hour  Intake 840 ml  Output 1490 ml  Net -650 ml   LBM: Last BM Date : 04/09/22 Baseline Weight: Weight: 40.8 kg Most recent weight: Weight: 37.5 kg       Palliative Assessment/Data: PPS: 20%      Patient Active Problem List   Diagnosis Date Noted   Failure to thrive in adult 04/08/2022   Elevated troponin 04/08/2022   Left thalamic infarction ([place]) 04/08/2022   Abdominal pain 04/08/2022   Claudication in peripheral vascular disease ([place]) 12/17/2021   Ascending aorta dilatation ([place]) 02/18/2021   Peripheral arterial disease ([place]) 03/24/2020   Aortic insufficiency    Fatty liver 11/04/2019   Elevated alkaline phosphatase level 09/07/2019   Vitamin D deficiency 09/07/2019   Smoker 09/06/2019   DOE (dyspnea on exertion) 09/06/2019   History of kidney cancer 09/06/2019   Abnormal lung sounds 09/06/2019   Abnormal ECG 09/06/2019   Chronic combined systolic (congestive) and diastolic (congestive) heart failure ([place]) 07/07/2017   ICD (implantable cardioverter-defibrillator) malfunction 10/01/2016   Ventricular tachycardia ([place])    Emphysema lung ([place]) 08/16/2016   Thoracic aortic aneurysm ([place]) 08/16/2016   ICD (implantable cardioverter-defibrillator) in place 04/20/2014   SOB (shortness of breath) 10/19/2013   HTN (hypertension)  02/03/2013   Cardiomyopathy, ischemic    Erectile dysfunction    CAD (coronary artery disease)    Rotator cuff tear    Depression    Dyslipidemia    Cancer of kidney [facility])     Palliative Care Assessment & Plan    Assessment/Recommendations/Plan  Continue supportive care and pain management until cancer diagnosis is confirmed with pathology Start oxycontin 10 mg BID PO Oxycodone 5mg  q3hr prn for breakthrough pain Hospice referral after pathology results- even if he does not have cancer he has evidence of failure to thrive and prognosis is likely still less than 6 months, he would not want artificial life prolonging measures Disposition may be an issue if he is not inpatient hospice candidate- unable to return home alone Will need ICD deactivated before discharge- he is in agreement with this- I have placed order   Code Status:DNR DNR  Prognosis:  < 6 months  Discharge Planning: To Be Determined  Care plan was discussed with patient and his sister  Thank you for allowing the Palliative Medicine Team to assist in the care of this patient.  Total time: 90 minutes  Greater than 50%  of this time was spent counseling and coordinating care related to the above assessment and plan.  Mariana Kaufman, AGNP-C Palliative Medicine   Please contact Palliative Medicine Team phone at [phone] for questions and concerns.

## 2022-04-11 NOTE — Progress Notes (Signed)
SLP Cancellation Note  Patient Details Name: Arya Hicken MRN: [id number] DOB: 1955/08/22   Cancelled treatment:       Reason Eval/Treat Not Completed: Patient at procedure or test/unavailable; OOF for biopsy.  ST will continue efforts for SLE completion as able.   Pat Mehgan Santmyer,M.S., CCC-SLP 04/11/2022, 11:46 AM

## 2022-04-12 ENCOUNTER — Inpatient Hospital Stay (HOSPITAL_COMMUNITY): Payer: Medicare Other

## 2022-04-12 DIAGNOSIS — Z515 Encounter for palliative care: Secondary | ICD-10-CM | POA: Diagnosis not present

## 2022-04-12 DIAGNOSIS — K862 Cyst of pancreas: Secondary | ICD-10-CM | POA: Diagnosis not present

## 2022-04-12 DIAGNOSIS — C786 Secondary malignant neoplasm of retroperitoneum and peritoneum: Secondary | ICD-10-CM | POA: Diagnosis not present

## 2022-04-12 DIAGNOSIS — R627 Adult failure to thrive: Secondary | ICD-10-CM | POA: Diagnosis not present

## 2022-04-12 DIAGNOSIS — I77810 Thoracic aortic ectasia: Secondary | ICD-10-CM

## 2022-04-12 DIAGNOSIS — Z7189 Other specified counseling: Secondary | ICD-10-CM | POA: Diagnosis not present

## 2022-04-12 LAB — CYTOLOGY - NON PAP

## 2022-04-12 MED ORDER — OXYCODONE HCL ER 10 MG PO T12A
20.0000 mg | EXTENDED_RELEASE_TABLET | Freq: Two times a day (BID) | ORAL | Status: DC
  Administered 2022-04-12 – 2022-04-13 (×2): 20 mg via ORAL
  Filled 2022-04-12 (×2): qty 2

## 2022-04-12 MED ORDER — ALPRAZOLAM 0.25 MG PO TABS
0.2500 mg | ORAL_TABLET | Freq: Every day | ORAL | Status: DC
  Administered 2022-04-12: 0.25 mg via ORAL
  Filled 2022-04-12: qty 1

## 2022-04-12 NOTE — Progress Notes (Signed)
PT Cancellation Note  Patient Details Name: Hector Byrd MRN: [id number] DOB: [birth year]   Cancelled Treatment:    Reason Eval/Treat Not Completed: Patient politely declined. He has been up in the recliner this morning and just returned to bed with assistance. Noted on supplemental O2. Will continue to follow and assist acutely.  Ellouise Newer, PT, DPT Physical Therapist Acute Rehabilitation Services [facility]  04/12/2022, 2:15 PM

## 2022-04-12 NOTE — Progress Notes (Signed)
PROGRESS NOTE    Hector Byrd  [id number] DOB: [birth year] DOA: 04/08/2022 PCP: Orma Render, NP     Brief Narrative:    depression, CAD, PAD, cardiomyopathy with ICD, ascending aortic dilatation, and recent abdominal pain, loss of appetite, and weight loss presents to the emergency department with progressive generalized weakness and multiple falls.   Subjective:   He developed hypoxia, was put on oxygen, he denies feeling sob, no tachypnea, no chest pain  Continue to have right side ab pain  He has poor oral intake   Assessment & Plan:  Principal Problem:   Failure to thrive in adult Active Problems:   CAD (coronary artery disease)   Emphysema lung (HCC)   Chronic combined systolic (congestive) and diastolic (congestive) heart failure (HCC)   Ascending aorta dilatation (HCC)   Elevated troponin   Left thalamic infarction (HCC)   Abdominal pain    Assessment and Plan:  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   Soft tissue floating in perihepatic space is concerning for peritoneal disease.  "  Follow-up on cytology results  Chest pain with tachyardia, mild elevation of troponin Seen by cardiology , currently on Lipitor, Plavix, Lopressor  echo lvef 99991111, grade 1 diastolic dysfunction  CTA chest no PE , .  Chronic combined  chf Echo 99991111, grade 1 diastolic dysfunction  Does not appear volume overloaded Received gentle hydration Close monitor volume status Cardiology signed off, continue goals of care discussion   Left thalamic infarction Not able to do MRI due to history of ICD Repeat CT head" Unchanged appearance of the age indeterminate  lacunar infarct at the left thalamus. No new abnormality including intracranial hemorrhage."  H/o PVD, chronic right lower extremity wound Seen by wound care  Significant weight loss /FTT/poor prognosis Palliative care consulted for goals of care, DNR, likely will benefit from hospice Appreciate palliative care help in Symptom control  Report ab pain, and feeling not able to empty bladder Per RN bladder scan 9cc Pain control    Nutritional Assessment: The patient's BMI is: Body mass index is 12.95 kg/m.Marland Kitchen Seen by dietician.  I agree with the assessment and plan as outlined below: Nutrition Status: Nutrition Problem: Inadequate oral intake Etiology: acute illness, chronic illness (FTT) Signs/Symptoms: other (comment), percent weight loss (BMI 12.96, per chart review) Percent weight loss: 38.4 % Interventions: Ensure Enlive (each supplement provides 350kcal and 20 grams of protein), MVI  .        Skin Assessment: I have examined the patient's skin and I agree with the wound assessment as performed by the wound care RN as outlined below: Pressure Injury 04/08/22 Pretibial Right Large scabbed wound on outside of right leg. (Active)  04/08/22 2100  Location: Pretibial  Location Orientation: Right  Staging:   Wound Description (Comments): Large scabbed wound on outside of right leg.  Present on Admission: Yes  Dressing Type Gauze (Comment) 04/11/22 2000     Pressure Injury Heel Left Stage 1 -  Intact skin with non-blanchable redness of a localized area usually over a bony prominence. (Active)     Location: Heel  Location Orientation: Left  Staging: Stage 1 -  Intact skin with non-blanchable redness of a localized area usually  over a bony prominence.  Wound Description (Comments):   Present on Admission: Yes  Dressing Type Foam - Lift dressing to assess site every shift 04/11/22 2000     Pressure Injury 04/09/22 Vertebral column Lower;Mid Unstageable - Full thickness  tissue loss in which the base of the injury is covered by slough (yellow, tan, gray, green or brown) and/or eschar (tan, brown or black) in the wound bed. 0.5 x 0.5 100% yel (Active)  04/09/22 1100  Location: Vertebral column  Location Orientation: Lower;Mid  Staging: Unstageable - Full thickness tissue loss in which the base of the injury is covered by slough (yellow, tan, gray, green or brown) and/or eschar (tan, brown or black) in the wound bed.  Wound Description (Comments): 0.5 x 0.5 100% yellow slough  Present on Admission: Yes  Dressing Type Foam - Lift dressing to assess site every shift 04/11/22 2000   Seen by wound care  I have Reviewed nursing notes, Vitals, pain scores, I/o's, Lab results and  imaging results since pt's last encounter, details please see discussion above  I ordered the following labs:  Unresulted Labs (From admission, onward)     Start     Ordered   04/15/22 0500  Creatinine, serum  (enoxaparin (LOVENOX)    CrCl >/= 30 ml/min)  Weekly,   R     Comments: while on enoxaparin therapy    04/08/22 2309   04/10/22 0500  Cancer antigen 19-9  Tomorrow morning,   R        04/09/22 1354   04/08/22 1732  Rapid urine drug screen (hospital performed)  ONCE - STAT,   STAT        04/08/22 1731   04/08/22 1732  Urinalysis, Routine w reflex microscopic -Urine, Clean Catch  Once,   URGENT       Question:  Specimen Source  Answer:  Urine, Clean Catch   04/08/22 1731             DVT prophylaxis: lovneox    Code Status:   Code Status: DNR  Family Communication: sister over the phone on 3/26 and 3/27, left message on 3/29 Disposition:   Dispo: The patient is from: home, lives by himself, next of kin is sister              Anticipated d/c is to: TBD              Anticipated d/c date is: >24hrs  Antimicrobials:    Anti-infectives (From admission, onward)    None          Objective: Vitals:   04/12/22 0500 04/12/22 0748 04/12/22 0938 04/12/22 0941   BP: 131/83 107/61    Pulse: 60 64 (!) 135 (!) 119  Resp: 15 16    Temp: 97.6 F (36.4 C) 97.9 F (36.6 C)    TempSrc: Oral Oral    SpO2: 100% (!) 87% (!) 83% (!) 83%  Weight:      Height:        Intake/Output Summary (Last 24 hours) at 04/12/2022 1037 Last data filed at 04/12/2022 0500 Gross per 24 hour  Intake 357 ml  Output 950 ml  Net -593 ml   Filed Weights   04/09/22 0028 04/09/22 0232  Weight: 40.8 kg 37.5 kg    Examination:  General exam: very fail, weak, cachectic, oriented x3 Respiratory system: Clear to auscultation. Respiratory effort normal. Cardiovascular system:  sinus tachycardia  Gastrointestinal system: Abdomen is nondistended, soft and  nontender.  Normal bowel sounds heard. Central nervous system: Alert and oriented. No focal neurological deficits. Extremities:  right leg wound now wrapped,  Skin: right leg wound, unstageable pressure injury low back above sacrum Psychiatry: flat affect    Data Reviewed: I have personally reviewed  labs and visualized  imaging studies since the last encounter and formulate the plan        Scheduled Meds:  atorvastatin  80 mg Oral Daily   enoxaparin (LOVENOX) injection  20 mg Subcutaneous QHS   feeding supplement  237 mL Oral TID BM   lamoTRIgine  100 mg Oral BID   leptospermum manuka honey  1 Application Topical Daily   lipase/protease/amylase  24,000 Units Oral TID AC   LORazepam  0.5 mg Oral QHS   melatonin  3 mg Oral QHS   metoprolol succinate  50 mg Oral Daily   mirtazapine  7.5 mg Oral QHS   multivitamin with minerals  1 tablet Oral Daily   oxyCODONE  10 mg Oral Q12H   pantoprazole  40 mg Oral Daily   tamsulosin  0.4 mg Oral Daily   thiamine  100 mg Oral Daily   venlafaxine XR  75 mg Oral Daily   Continuous Infusions:     LOS: 4 days   Time spent:  40mins  Florencia Reasons, MD PhD FACP [facility]  Available via Epic secure chat 7am-7pm for nonurgent issues Please page for urgent  issues To page the attending provider between 7A-7P or the covering provider during after hours 7P-7A, please log into the web site [email or web address] and access using universal [place] password for that web site. If you do not have the password, please call the hospital operator.    04/12/2022, 10:37 AM
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
## 2022-04-12 NOTE — TOC Initial Note (Addendum)
Transition of Care [facility]) - Initial/Assessment Note    Patient Details  Name: Artis Southam MRN: [id number] Date of Birth: August 12, 1955  [facility] CM/SW Contact:    Coralee Pesa, [place] Phone Number: 04/12/2022, 12:57 PM  Clinical Narrative:                 CSW notified by Palliative that pt and family have chosen to pursue Inpatient Hospice placement with [facility] in [place], at [facility]. Referral made and acknowledged. Misty with [facility] will review pt to see if he meets qualifications. Pt will need ambulance transport at DC. TOC will continue to follow for DC needs.   Expected Discharge Plan: [place] Barriers to Discharge: Hospice Bed not available, Continued Medical Work up   Patient Goals and CMS Choice Patient states their goals for this hospitalization and ongoing recovery are:: Pt not appropriate for goal setting at this time. CMS Medicare.gov Compare Post Acute Care list provided to:: Patient Choice offered to / list presented to : Patient      Expected Discharge Plan and Services In-house Referral: Hospice / [place] Acute Care Choice: Residential Hospice Bed Living arrangements for the past 2 months: Single Family Home                                      Prior Living Arrangements/Services Living arrangements for the past 2 months: Single Family Home Lives with:: Self Patient language and need for interpreter reviewed:: Yes Do you feel safe going back to the place where you live?: Yes      Need for Family Participation in Patient Care: Yes (Comment) Care giver support system in place?: No (comment)   Criminal Activity/Legal Involvement Pertinent to Current Situation/Hospitalization: No - Comment as needed  Activities of Daily Living      Permission Sought/Granted Permission sought to share information with : Family Supports Permission granted to share information with : Yes, Verbal Permission  Granted  Share Information with NAME: Lynita Lombard     Permission granted to share info w Relationship: Sister  Permission granted to share info w Contact Information: [phone]  Emotional Assessment Appearance:: Appears stated age Attitude/Demeanor/Rapport: Lethargic Affect (typically observed): Quiet Orientation: : Oriented to Self, Oriented to Place, Oriented to  Time, Oriented to Situation Alcohol / Substance Use: Not Applicable Psych Involvement: No (comment)  Admission diagnosis:  Weakness [R53.1] Elevated troponin [R79.89] Failure to thrive in adult [R62.7] Patient Active Problem List   Diagnosis Date Noted   Failure to thrive in adult 04/08/2022   Elevated troponin 04/08/2022   Left thalamic infarction ([place]) 04/08/2022   Abdominal pain 04/08/2022   Claudication in peripheral vascular disease ([place]) 12/17/2021   Ascending aorta dilatation ([place]) 02/18/2021   Peripheral arterial disease ([place]) 03/24/2020   Aortic insufficiency    Fatty liver 11/04/2019   Elevated alkaline phosphatase level 09/07/2019   Vitamin D deficiency 09/07/2019   Smoker 09/06/2019   DOE (dyspnea on exertion) 09/06/2019   History of kidney cancer 09/06/2019   Abnormal lung sounds 09/06/2019   Abnormal ECG 09/06/2019   Chronic combined systolic (congestive) and diastolic (congestive) heart failure ([place]) 07/07/2017   ICD (implantable cardioverter-defibrillator) malfunction 10/01/2016   Ventricular tachycardia ([place])    Emphysema lung ([place]) 08/16/2016   Thoracic aortic aneurysm ([place]) 08/16/2016   ICD (implantable cardioverter-defibrillator) in place 04/20/2014   SOB (shortness of breath)  10/19/2013   HTN (hypertension) 02/03/2013   Cardiomyopathy, ischemic    Erectile dysfunction    CAD (coronary artery disease)    Rotator cuff tear    Depression    Dyslipidemia    Cancer of kidney ([place])    PCP:  Orma Render, NP Pharmacy:   [facility] [address], [place] [address] [place] [address] Phone: [phone] Fax: [place], [place] [address] Phone: [phone] Fax: [phone]  [facility] [address] [place] [address] Phone: [phone] Fax: [phone]  [id number] [place], [place] [id number] [place] [id number] Phone: [phone] Fax: [phone]     Social Determinants of Health (SDOH) Social History: SDOH Screenings   Depression (PHQ2-9): Low Risk  (04/27/2020)  Tobacco Use: High Risk (04/10/2022)   SDOH Interventions:     Readmission Risk Interventions     No data to display

## 2022-04-12 NOTE — Progress Notes (Addendum)
Patient and sister at bedside reported that patient wallet was missing. Room searched, all drawers and closets. Patient belongings bag at bedside searched, no wallet found. Charge reviewed, no documentation noted of wallet or belongings. Chart nurse and oncoming night shift nurse notified at this time.

## 2022-04-12 NOTE — Progress Notes (Signed)
Pt has a [employer] ICD, company contacted and request made to switch device off. Local will contact facility

## 2022-04-12 NOTE — Progress Notes (Signed)
[employer] phone number [phone]

## 2022-04-12 NOTE — Progress Notes (Signed)
MEWS Progress Note  Patient Details Name: Hector Byrd MRN: [id number] DOB: [birth year] Today's Date: 04/12/2022   MEWS Flowsheet Documentation:  Assess: MEWS Score Temp: (!) 97.4 F (36.3 C) BP: 92/61 MAP (mmHg): 72 Pulse Rate: 61 ECG Heart Rate: 80 Resp: 16 Level of Consciousness: Alert SpO2: (!) 72 % O2 Device: Nasal Cannula Patient Activity (if Appropriate): In bed O2 Flow Rate (L/min): 6 L/min Assess: MEWS Score MEWS Temp: 0 MEWS Systolic: 1 MEWS Pulse: 0 MEWS RR: 0 MEWS LOC: 0 MEWS Score: 1 MEWS Score Color: Green Assess: SIRS CRITERIA SIRS Temperature : 0 SIRS Respirations : 0 SIRS Pulse: 0 SIRS WBC: 0 SIRS Score Sum : 0 Assess: if the MEWS score is Yellow or Red Were vital signs taken at a resting state?: Yes Focused Assessment: No change from prior assessment Does the patient meet 2 or more of the SIRS criteria?: No MEWS guidelines implemented : No, vital signs rechecked Notify: Charge Nurse/RN Name of Charge Nurse/RN Notified: Ben, RN Provider Notification Provider Name/Title: Dr. Erlinda Hong Date Provider Notified: 04/12/22 Time Provider Notified: 1300 Method of Notification: Page Notification Reason: Change in status Provider response: At bedside, See new orders   Patient being monitored for hypoxia. 02 Benedict increased from 2 L to 6 L with no improvement in saturation. Patient remains alert, no change in LOC. Dr. Erlinda Hong notified.    Charlie Seda Donald Siva 04/12/2022, 8:44 PM

## 2022-04-12 NOTE — Progress Notes (Signed)
Daily Progress Note   Patient Name: Hector Byrd       Date: 04/12/2022 DOB: 1955/10/02  Age: 67 y.o. MRN#: [id number] Attending Physician: Hector Reasons, MD Primary Care Physician: Hector Render, NP Admit Date: 04/08/2022  Reason for Consultation/Follow-up: Establishing goals of care  Patient Profile/HPI:   67 y.o. male  with past medical history of anxiety and depression, CAD, PAD, cardiomyopathy with ICD, ascending aortic dilatation, abdominal pain with weight loss, admitted on 04/08/2022 with progressive weakness. Workup reveals likely stage IV pancreatic cancer with pancreatic mass, abdominal carcinamatosis, and cavitary lung nodules evident on imaging. Palliative medicine consulted for goals of care.   Subjective: AM: Chart reviewed including labs, progress notes, imaging.  Patient assessed at the bedside.  He initially is frustrated, stating that he feels staff is "blaming" him for his inability to get in and out of bed on his own.  No family present during my visit.  Patient reports his pain is a 4/10.  He is on 6 L oxygen via nasal cannula.  I assisted him with sitting on the edge of the bed, which was helpful.  Created space and opportunity for patient's thoughts and feelings on his current illness.  He shares his feelings that positive paracentesis results are inevitable, though he does not want to "give into it" and transition to full comfort care just yet.  He is looking forward to the arrival of both of his sisters and feels relieved that they have shared they will not allow for him to remain alone and unsupported.  We discussed the option of increasing his long-acting OxyContin for improved pain control.  Also discussed that continuing to use PRNs is important to help determine effective total  daily dosage.  This may be limited by hypoxia, which patient also understands.  I shared my recommendations for consideration of residential hospice, as this new development of hypoxia likely means his prognosis is now just weeks.  Provided with hard choices for loving people booklet and compromise sight booklet for further review.  Also provided with paper and pen as he requested.  PM: I then returned to bedside and patient's sister Hector Byrd was present.  I reviewed the above conversation and she agrees that residential hospice would be best for him.  She also feels that he would be best served  by being on comfort care while in the hospital, but is not pushing this matter at this time.  Patient's other sister will be arriving from [place] around 6 to 7 PM tonight.  I encouraged him to tour the hospice facility.  Unfortunately, they will both need to return home on Sunday.  They have not all been together in person since 12 years ago.  We also discussed the process of ICD deactivation and sister is appreciative.  Questions and concerns addressed.  PMT will continue to follow and support.   Physical Exam Vitals and nursing note reviewed.  Constitutional:      General: He is not in acute distress.    Appearance: He is ill-appearing.     Comments: cachectic  Cardiovascular:     Rate and Rhythm: Tachycardia present.  Pulmonary:     Effort: Pulmonary effort is normal. No respiratory distress.  Musculoskeletal:     Comments: Diffuse muscle wasting  Skin:    Coloration: Skin is jaundiced.  Neurological:     Mental Status: He is alert. Mental status is at baseline.  Psychiatric:     Comments: Frustrated            Vital Signs: BP 107/61 (BP Location: Left Arm)   Pulse (!) 119   Temp 97.9 F (36.6 C) (Oral)   Resp 16   Ht 5\' 7"  (1.702 m)   Wt 37.5 kg   SpO2 (!) 83%   BMI 12.95 kg/m  SpO2: SpO2: (!) 83 % O2 Device: O2 Device: Nasal Cannula O2 Flow Rate: O2 Flow Rate (L/min): 2  L/min  Intake/output summary:  Intake/Output Summary (Last 24 hours) at 04/12/2022 1047 Last data filed at 04/12/2022 0500 Gross per 24 hour  Intake 357 ml  Output 950 ml  Net -593 ml    LBM: Last BM Date : 04/11/22 Baseline Weight: Weight: 40.8 kg Most recent weight: Weight: 37.5 kg       Palliative Assessment/Data: PPS: 20%   Palliative Care Assessment & Plan    Assessment/Recommendations/Plan Continue DNR/DNI Continue supportive care and pain management until cancer diagnosis is confirmed with pathology Increase oxycontin to 20 mg BID PO Continue oxycodone 5mg  q3hr prn for breakthrough pain Hospice referral today for [facility] given new hypoxia and overall prognosis of weeks Will need ICD deactivated before discharge- he is in agreement with this-order in place Psychosocial and emotional support provided PMT will continue to follow and support   Prognosis: Weeks  Discharge Planning: To Be Determined  Care plan was discussed with patient, RN, his sister Hector Byrd, Hector Byrd   Total time: I spent 65 minutes in the care of the patient today in the above activities and documenting the encounter.  MDM: High   Hector Byrd Palliative Medicine Team Team phone # [phone]  Thank you for allowing the Palliative Medicine Team to assist in the care of this patient. Please utilize secure chat with additional questions, if there is no response within 30 minutes please call the above phone number.  Palliative Medicine Team providers are available by phone from 7am to 7pm daily and can be reached through the team cell phone.  Should this patient require assistance outside of these hours, please call the patient's attending physician.  Portions of this note are a verbal dictation therefore any spelling and/or grammatical errors are due to the "[place] [employer]" system interpretation.

## 2022-04-13 DIAGNOSIS — R52 Pain, unspecified: Secondary | ICD-10-CM

## 2022-04-13 DIAGNOSIS — R627 Adult failure to thrive: Secondary | ICD-10-CM | POA: Diagnosis not present

## 2022-04-13 DIAGNOSIS — R531 Weakness: Secondary | ICD-10-CM | POA: Diagnosis not present

## 2022-04-13 DIAGNOSIS — Z789 Other specified health status: Secondary | ICD-10-CM

## 2022-04-13 DIAGNOSIS — Z66 Do not resuscitate: Secondary | ICD-10-CM

## 2022-04-13 DIAGNOSIS — K862 Cyst of pancreas: Secondary | ICD-10-CM | POA: Diagnosis not present

## 2022-04-13 DIAGNOSIS — Z711 Person with feared health complaint in whom no diagnosis is made: Secondary | ICD-10-CM

## 2022-04-13 DIAGNOSIS — R7989 Other specified abnormal findings of blood chemistry: Secondary | ICD-10-CM | POA: Diagnosis not present

## 2022-04-13 MED ORDER — TAMSULOSIN HCL 0.4 MG PO CAPS: 0.4000 mg | ORAL_CAPSULE | Freq: Every day | ORAL | Status: DC

## 2022-04-13 MED ORDER — OXYCODONE HCL ER 20 MG PO T12A: 20.0000 mg | EXTENDED_RELEASE_TABLET | Freq: Two times a day (BID) | ORAL | 0 refills | Status: DC

## 2022-04-13 NOTE — Plan of Care (Signed)
D/C to facility report called. AVS included in packet all lines D/C . BP 115/73 (BP Location: Left Arm)   Pulse 83   Temp 97.6 F (36.4 C) (Oral)   Resp 16   Ht 5\' 7"  (1.702 m)   Wt 37.5 kg   SpO2 (!) 78%   BMI 12.95 kg/m  Hector Byrd 04/13/22 2:15 PM

## 2022-04-13 NOTE — Progress Notes (Signed)
Daily Progress Note   Patient Name: Hector Byrd       Date: 04/13/2022 DOB: 1955/03/05  Age: 67 y.o. MRN#: [id number] Attending Physician: Florencia Reasons, MD Primary Care Physician: Orma Render, NP Admit Date: 04/08/2022  Reason for Consultation/Follow-up: Disposition, Establishing goals of care, Non pain symptom management, Pain control, Psychosocial/spiritual support, and Terminal Care  Subjective: I have reviewed medical records including EPIC notes, MAR, and labs. Noted nursing has already notified [employer] to deactivate ICD. Received report from primary RN - no acute concerns.  Received updates from [facility] and hospice liaison that patient has been offered a bed at [facility] today, consents already signed, and transport being called.  Went to visit patient at bedside - both sisters, Peter [place] and [place], present. Patient was lying in bed awake, alert, oriented, and able to participate in conversation. No signs or non-verbal gestures of pain or discomfort noted. No respiratory distress, increased work of breathing, or secretions noted. He is on 4L O2 [place]. He is feeding himself breakfast.  Emotional support provided to patient and family. Patient feels his pain is better managed today; however, he worries about pain during transport. Discussed pre-medicating with IV dilaudid directly prior to transport - patient expresses appreciation. Answered questions regarding atmosphere at hospice facility. Patient and family express happiness that bed is available today.   All questions and concerns addressed. Encouraged to call with questions and/or concerns. PMT card provided.  Notified RN to administer IV dilaudid prior to transport.   Length of Stay: 5  Current Medications: Scheduled Meds:   ALPRAZolam   0.25 mg Oral QHS   atorvastatin  80 mg Oral Daily   enoxaparin (LOVENOX) injection  20 mg Subcutaneous QHS   feeding supplement  237 mL Oral TID BM   lamoTRIgine  100 mg Oral BID   leptospermum manuka honey  1 Application Topical Daily   lipase/protease/amylase  24,000 Units Oral TID AC   melatonin  3 mg Oral QHS   metoprolol succinate  50 mg Oral Daily   mirtazapine  7.5 mg Oral QHS   multivitamin with minerals  1 tablet Oral Daily   oxyCODONE  20 mg Oral Q12H   pantoprazole  40 mg Oral Daily   tamsulosin  0.4 mg Oral Daily   thiamine  100 mg Oral Daily  venlafaxine XR  75 mg Oral Daily    Continuous Infusions:   PRN Meds: acetaminophen **OR** acetaminophen (TYLENOL) oral liquid 160 mg/5 mL **OR** acetaminophen, HYDROmorphone (DILAUDID) injection, loperamide, LORazepam, ondansetron (ZOFRAN) IV, oxyCODONE  Physical Exam Vitals and nursing note reviewed.  Constitutional:      General: He is not in acute distress.    Appearance: He is cachectic. He is ill-appearing.  Pulmonary:     Effort: No respiratory distress.  Skin:    General: Skin is warm and dry.  Neurological:     Mental Status: He is alert and oriented to person, place, and time.     Motor: Weakness present.  Psychiatric:        Attention and Perception: Attention normal.        Behavior: Behavior is cooperative.        Cognition and Memory: Cognition and memory normal.             Vital Signs: BP 115/73 (BP Location: Left Arm)   Pulse 83   Temp 97.6 F (36.4 C) (Oral)   Resp 16   Ht 5\' 7"  (1.702 m)   Wt 37.5 kg   SpO2 (!) 78%   BMI 12.95 kg/m  SpO2: SpO2: (!) 78 % O2 Device: O2 Device: Nasal Cannula O2 Flow Rate: O2 Flow Rate (L/min): 6 L/min  Intake/output summary:  Intake/Output Summary (Last 24 hours) at 04/13/2022 1108 Last data filed at 04/13/2022 0551 Gross per 24 hour  Intake --  Output 150 ml  Net -150 ml   LBM: Last BM Date : 04/12/22 Baseline Weight: Weight: 40.8 kg Most recent  weight: Weight: 37.5 kg       Palliative Assessment/Data: PPS 20%      Patient Active Problem List   Diagnosis Date Noted   Failure to thrive in adult 04/08/2022   Elevated troponin 04/08/2022   Left thalamic infarction ([place]) 04/08/2022   Abdominal pain 04/08/2022   Claudication in peripheral vascular disease ([place]) 12/17/2021   Ascending aorta dilatation ([place]) 02/18/2021   Peripheral arterial disease ([place]) 03/24/2020   Aortic insufficiency    Fatty liver 11/04/2019   Elevated alkaline phosphatase level 09/07/2019   Vitamin D deficiency 09/07/2019   Smoker 09/06/2019   DOE (dyspnea on exertion) 09/06/2019   History of kidney cancer 09/06/2019   Abnormal lung sounds 09/06/2019   Abnormal ECG 09/06/2019   Chronic combined systolic (congestive) and diastolic (congestive) heart failure ([place]) 07/07/2017   ICD (implantable cardioverter-defibrillator) malfunction 10/01/2016   Ventricular tachycardia (H[facility])    Emphysema lung ([place]) 08/16/2016   Thoracic aortic aneurysm ([place]) 08/16/2016   ICD (implantable cardioverter-defibrillator) in place 04/20/2014   SOB (shortness of breath) 10/19/2013   HTN (hypertension) 02/03/2013   Cardiomyopathy, ischemic    Erectile dysfunction    CAD (coronary artery disease)    Rotator cuff tear    Depression    Dyslipidemia    Cancer of kidney [facility])     Palliative Care Assessment & Plan   Patient Profile: 67 y.o. male with past medical history of anxiety and depression, CAD, PAD, cardiomyopathy with ICD, ascending aortic dilatation, abdominal pain with weight loss, admitted on 04/08/2022 with progressive weakness. Workup reveals likely stage IV pancreatic cancer with pancreatic mass, abdominal carcinamatosis, and cavitary lung nodules evident on imaging. Palliative medicine consulted for goals of care.   Assessment: Principal Problem:   Failure to thrive in adult Active Problems:   CAD (coronary artery disease)   Emphysema lung (HCC)  Chronic  combined systolic (congestive) and diastolic (congestive) heart failure (HCC)   Ascending aorta dilatation (HCC)   Elevated troponin   Left thalamic infarction [facility])   Abdominal pain   Concern about end of life  Recommendations/Plan: Discharge to [facility] today Continue DNR/DNI as previously documented Provide dose of IV dilaudid prior to transport to assist with pain management during travel PMT will continue to follow peripherally. If there are any imminent needs please call the service directly  Goals of Care and Additional Recommendations: Limitations on Scope of Treatment: Avoid Hospitalization, No Artificial Feeding, and No Tracheostomy  Code Status:    Code Status Orders  (From admission, onward)           Start     Ordered   04/10/22 1347  Do not attempt resuscitation (DNR)  Continuous       Question Answer Comment  If patient has no pulse and is not breathing Do Not Attempt Resuscitation   If patient has a pulse and/or is breathing: Medical Treatment Goals LIMITED ADDITIONAL INTERVENTIONS: Use medication/IV fluids and cardiac monitoring as indicated; Do not use intubation or mechanical ventilation (DNI), also provide comfort medications.  Transfer to Progressive/Stepdown as indicated, avoid Intensive Care.   Consent: Discussion documented in EHR or advanced directives reviewed      04/10/22 1346           Code Status History     Date Active Date Inactive Code Status Order ID Comments User Context   04/08/2022 2309 04/10/2022 1346 Full Code [id number]  Vianne Bulls, MD ED   12/17/2021 1533 12/18/2021 1700 Full Code [id number]  Lorretta Harp, MD Inpatient   10/02/2016 2059 10/03/2016 1442 Full Code [id number]  Evans Lance, MD Inpatient   10/01/2016 0257 10/01/2016 1219 Full Code [id number]  Rudean Curt, MD ED   12/07/2012 1146 12/08/2012 1319 Full Code [id number]  Evans Lance, MD Inpatient       Prognosis:  < 2 weeks  Discharge  Planning: Hospice facility  Care plan was discussed with primary RN, patient, patient's family, TOC, hospice liaison  Thank you for allowing the Palliative Medicine Team to assist in the care of this patient.  Lin Landsman, NP  Please contact Palliative Medicine Team phone at [phone] for questions and concerns.   *Portions of this note are a verbal dictation therefore any spelling and/or grammatical errors are due to the "[place] [employer]" system interpretation.

## 2022-04-13 NOTE — Plan of Care (Signed)
Pt d/c to [facility]. Report called. BP 115/73 (BP Location: Left Arm)   Pulse 83   Temp 97.6 F (36.4 C) (Oral)   Resp 16   Ht 5\' 7"  (1.702 m)   Wt 37.5 kg   SpO2 (!) 78%   BMI 12.95 kg/m  Pt medicated before discharge. AVS in discharge pack. No further questions from family or staff. Louanne Skye 04/13/22 12:46 PM

## 2022-04-13 NOTE — Discharge Summary (Signed)
Discharge Summary  Hector Byrd [id number] DOB: 1955/05/24  PCP: Orma Render, NP  Admit date: 04/08/2022 Discharge date: 04/13/2022     Time spent: 67mins, more than 50% time spent on coordination of care.   Recommendations for Outpatient Follow-up:   Discharge to residential hospice    Discharge Diagnoses:  Active Hospital Problems   Diagnosis Date Noted   Failure to thrive in adult 04/08/2022   Elevated troponin 04/08/2022   Left thalamic infarction ([place]) 04/08/2022   Abdominal pain 04/08/2022   Ascending aorta dilatation ([place]) 02/18/2021   Chronic combined systolic (congestive) and diastolic (congestive) heart failure ([place]) 07/07/2017   Emphysema lung ([place]) 08/16/2016   CAD (coronary artery disease)     Resolved Hospital Problems  No resolved problems to display.    Discharge Condition: stable  Diet recommendation: regular diet   Filed Weights   04/09/22 0028 04/09/22 0232  Weight: 40.8 kg 37.5 kg    History of present illness: ( per admitting MD Dr Myna Hidalgo) PCP: Orma Render, NP    Patient coming from: Home    Chief Complaint: Weakness, falls    HPI: Hector Byrd is a 67 y.o. male with medical history significant for depression, CAD, PAD, cardiomyopathy with ICD, ascending aortic dilatation, and recent abdominal pain, loss of appetite, and weight loss presents to the emergency department with progressive generalized weakness and multiple falls.   Patient reports upper abdominal pain, loose stools, and weight loss progressing over the past 3 months or so.  He has also been experiencing nausea but no vomiting.  He denies any melena, hematochezia, fever, or chills.  He has more recently been developing worsening weakness with frequent falls.  1 week ago, he felt as though the right leg was weaker, but now reports bilateral lower extremity weakness with inability to ambulate.   ED Course: Upon arrival to the ED, patient is found to be afebrile and saturating  well on room air with stable blood pressure.  EKG demonstrates sinus rhythm with LVH and QTc 570 ms.  Chest x-ray is negative for acute findings.  Head CT negative for acute intracranial hemorrhage but notable for age-indeterminate left thalamic infarction.  Labs are most notable for elevated BUN to creatinine ratio, total bilirubin 1.7, and troponin 110.     Neurology and cardiology were consulted with ED physician and hospitalists asked to admit.   Hospital Course:  Principal Problem:   Failure to thrive in adult Active Problems:   CAD (coronary artery disease)   Emphysema lung (H[facility])   Chronic combined systolic (congestive) and diastolic (congestive) heart failure (H[facility])   Ascending aorta dilatation ([facility])   Elevated troponin   Left thalamic infarction [facility])   Abdominal pain   Assessment and Plan:   Metastatic cancer with carcinomatosis,  -CT chest does show emphysema and numerous bilateral lung nodules,  several of which demonstrate central cavitation -S/p ultrasound-guided paracentesis on 3/28, per IR "Removed 90 ml from right abdomen.   Soft tissue floating in perihepatic space is concerning for peritoneal disease.  "  -peritoneal fluids cytology + adenocarcinoma -IR and oncology Dr Benay Spice input appreciated, palliative care input appreciated, plan to transition to residential hospice   Chest pain with tachyardia, mild elevation of troponin Seen by cardiology , currently on Lipitor, Plavix, Lopressor  echo lvef [id number], grade 1 diastolic dysfunction  CTA chest no PE    Chronic combined  chf Echo [id number], grade 1 diastolic dysfunction  Does not appear volume overloaded Cardiology signed  off     Left thalamic infarction Not able to do MRI due to history of ICD Repeat CT head" Unchanged appearance of the age indeterminate lacunar infarct at the left thalamus. No new abnormality including intracranial hemorrhage."   H/o PVD, chronic right lower extremity wound Seen by wound  care   Significant weight loss /FTT/poor prognosis Palliative care consulted for goals of care, DNR, discharge to residential hospice Appreciate palliative care help in Symptom control      Nutritional Assessment: The patient's BMI is: Body mass index is 12.95 kg/m.Marland Kitchen Seen by dietician.  I agree with the assessment and plan as outlined below: Nutrition Status: Nutrition Problem: Inadequate oral intake Etiology: acute illness, chronic illness (FTT) Signs/Symptoms: other (comment), percent weight loss (BMI 12.96, per chart review) Percent weight loss: 38.4 % Interventions: Ensure Enlive (each supplement provides 350kcal and 20 grams of protein), MVI   .               Skin Assessment: I have examined the patient's skin and I agree with the wound assessment as performed by the wound care RN as outlined below:     Pressure Injury 04/08/22 Pretibial Right Large scabbed wound on outside of right leg. (Active)  04/08/22 2100  Location: Pretibial  Location Orientation: Right  Staging:   Wound Description (Comments): Large scabbed wound on outside of right leg.  Present on Admission: Yes  Dressing Type Gauze (Comment) 04/11/22 2000     Pressure Injury Heel Left Stage 1 -  Intact skin with non-blanchable redness of a localized area usually over a bony prominence. (Active)     Location: Heel  Location Orientation: Left  Staging: Stage 1 -  Intact skin with non-blanchable redness of a localized area usually over a bony prominence.  Wound Description (Comments):   Present on Admission: Yes  Dressing Type Foam - Lift dressing to assess site every shift 04/11/22 2000     Pressure Injury 04/09/22 Vertebral column Lower;Mid Unstageable - Full thickness tissue loss in which the base of the injury is covered by slough (yellow, tan, gray, green or brown) and/or eschar (tan, brown or black) in the wound bed. 0.5 x 0.5 100% yel (Active)  04/09/22 1100  Location: Vertebral column  Location  Orientation: Lower;Mid  Staging: Unstageable - Full thickness tissue loss in which the base of the injury is covered by slough (yellow, tan, gray, green or brown) and/or eschar (tan, brown or black) in the wound bed.  Wound Description (Comments): 0.5 x 0.5 100% yellow slough  Present on Admission: Yes  Dressing Type Foam - Lift dressing to assess site every shift 04/11/22 2000    Seen by wound care    Discharge Exam: BP 115/73 (BP Location: Left Arm)   Pulse 83   Temp 97.6 F (36.4 C) (Oral)   Resp 16   Ht 5\' 7"  (1.702 m)   Wt 37.5 kg   SpO2 (!) 78%   BMI 12.95 kg/m   General: alert , oriented x3, calm, NAD Cardiovascular: RRR Respiratory: normal respiratory effort , currently on 4 liter oxygen supplement    Discharge Instructions     Diet general   Complete by: As directed    Discharge wound care:   Complete by: As directed    Pressure offloading      Allergies as of 04/13/2022   No Known Allergies      Medication List     STOP taking these medications    Buprenorphine  HCl-Naloxone HCl 8-2 MG Film   lisinopril 5 MG tablet Commonly known as: ZESTRIL   pantoprazole 40 MG tablet Commonly known as: Protonix       TAKE these medications    atorvastatin 80 MG tablet Commonly known as: LIPITOR TAKE 1 TABLET BY MOUTH EVERY DAY   CAL-MAG-ZINC PO Take 1 capsule by mouth daily.   clonazePAM 1 MG tablet Commonly known as: KLONOPIN Take 1-3 mg by mouth See admin instructions. Take 1 mg in the morning and 3 mg at night   clopidogrel 75 MG tablet Commonly known as: PLAVIX Take 1 tablet (75 mg total) by mouth daily with breakfast.   cyanocobalamin 500 MCG tablet Commonly known as: VITAMIN B12 Take 500 mcg by mouth daily.   lamoTRIgine 100 MG tablet Commonly known as: LAMICTAL Take 100 mg by mouth 2 (two) times daily.   metoprolol succinate 50 MG 24 hr tablet Commonly known as: TOPROL-XL TAKE 1 TABLET BY MOUTH DAILY. TAKE WITH OR IMMEDIATELY  FOLLOWING A MEAL. What changed: additional instructions   naphazoline-pheniramine 0.025-0.3 % ophthalmic solution Commonly known as: NAPHCON-A Place 2 drops into both eyes daily.   nitroGLYCERIN 0.4 MG SL tablet Commonly known as: NITROSTAT Place 1 tablet (0.4 mg total) under the tongue every 5 (five) minutes as needed for chest pain (MAX 3 TABLETS).   oxyCODONE 20 mg 12 hr tablet Commonly known as: OXYCONTIN Take 1 tablet (20 mg total) by mouth every 12 (twelve) hours.   tamsulosin 0.4 MG Caps capsule Commonly known as: FLOMAX Take 1 capsule (0.4 mg total) by mouth daily. Start taking on: April 14, 2022   venlafaxine XR 75 MG 24 hr capsule Commonly known as: EFFEXOR-XR Take 75 mg by mouth 2 (two) times daily.   Vitamin D3 50 MCG (2000 UT) Tabs Generic drug: Cholecalciferol Take 2,000 Units by mouth daily.               Discharge Care Instructions  (From admission, onward)           Start     Ordered   04/13/22 0000  Discharge wound care:       Comments: Pressure offloading   04/13/22 0829           No Known Allergies    The results of significant diagnostics from this hospitalization (including imaging, microbiology, ancillary and laboratory) are listed below for reference.    Significant Diagnostic Studies: DG CHEST PORT 1 VIEW  Result Date: 04/12/2022 CLINICAL DATA:  Hypoxia EXAM: PORTABLE CHEST 1 VIEW COMPARISON:  Chest x-ray dated April 08, 2022; chest CT dated April 09, 2018 FINDINGS: Cardiac and mediastinal contours are unchanged. Status post median sternotomy. Left chest wall ICD with unchanged lead position. A bilateral pulmonary nodules, better appreciated on prior CT. No consolidation. No evidence of pleural effusion or pneumothorax. Advanced degenerative changes of the bilateral shoulders. IMPRESSION: Bilateral pulmonary nodules, better evaluated on prior CT. Electronically Signed   By: Yetta Glassman M.D.   On: 04/12/2022 10:43   US  Paracentesis  Result Date: 04/11/2022 INDICATION: 67 year old with concern for metastatic disease and peritoneal disease. Diagnosis is needed. Recent CT imaging demonstrates a small amount of perihepatic ascites and concern for small amount of peritoneal disease around the liver. Patient just recently stopped Plavix and plan for only a diagnostic paracentesis at this time. EXAM: ULTRASOUND-GUIDED PARACENTESIS COMPARISON:  None Available. MEDICATIONS: None. COMPLICATIONS: None immediate. TECHNIQUE: Informed written consent was obtained from the patient after a discussion of  the risks, benefits and alternatives to treatment. A timeout was performed prior to the initiation of the procedure. The right side of the abdomen was evaluated with ultrasound. Small amount of perihepatic ascites was identified. The right side of the abdomen was prepped with chlorhexidine and sterile field was created. Skin was anesthetized using 1% lidocaine. Using ultrasound guidance, a Yueh catheter was directed into the small amount of fluid. 90 mL of clear yellow fluid was aspirated. Yueh catheter was removed. Bandage placed over the puncture site. FINDINGS: Small amount of ascites in the right abdomen around the liver and just inferior to the liver. In addition, there is echogenic material within the ascites near the liver. This echogenic material is concerning for peritoneal disease. IMPRESSION: Successful ultrasound-guided paracentesis yielding 90 mL yellow fluid. Fluid was sent for cytology. Electronically Signed   By: Markus Daft M.D.   On: 04/11/2022 17:15   CUP PACEART REMOTE DEVICE CHECK  Result Date: 04/11/2022 Scheduled non-billable remote reviewed. Normal device function.  Battery estimated 11% Next remote 4/29 [place], CVRS  CT Angio Chest Pulmonary Embolism (PE) W or WO Contrast  Result Date: 04/09/2022 CLINICAL DATA:  Chest pain tachycardia EXAM: CT ANGIOGRAPHY CHEST WITH CONTRAST TECHNIQUE: Multidetector CT imaging of the  chest was performed using the standard protocol during bolus administration of intravenous contrast. Multiplanar CT image reconstructions and MIPs were obtained to evaluate the vascular anatomy. RADIATION DOSE REDUCTION: This exam was performed according to the departmental dose-optimization program which includes automated exposure control, adjustment of the mA and/or kV according to patient size and/or use of iterative reconstruction technique. CONTRAST:  65mL OMNIPAQUE IOHEXOL 350 MG/ML SOLN COMPARISON:  CT 04/09/2022, chest CT 03/08/2021 FINDINGS: Cardiovascular: Satisfactory opacification of the pulmonary arteries to the segmental level. No evidence of pulmonary embolism. Moderate aortic atherosclerosis. Coronary vascular calcification. Normal cardiac size. No pericardial effusion. Mediastinum/Nodes: Midline trachea. No thyroid mass. No suspicious lymph nodes. Esophagus within normal limits. Lungs/Pleura: Emphysema. Numerous bilateral pulmonary nodules, several of which demonstrate central cavitation, the largest is seen in the peripheral left upper lobe on series 6, image 51 and measures 2 by 1.9 cm. Upper Abdomen: See separately dictated CT abdomen report Musculoskeletal: Post sternotomy changes. No acute or suspicious osseous abnormality. Review of the MIP images confirms the above findings. IMPRESSION: 1. Negative for acute pulmonary embolus. 2. Emphysema. Numerous bilateral pulmonary nodules, several of which demonstrate central cavitation. Findings could be secondary to septic emboli, metastatic disease, or atypical infection. 3. Aortic atherosclerosis. Aortic Atherosclerosis (ICD10-I70.0) and Emphysema (ICD10-J43.9). Electronically Signed   By: Donavan Foil M.D.   On: 04/09/2022 18:53   CT HEAD WO CONTRAST (5MM)  Result Date: 04/09/2022 CLINICAL DATA:  Stroke follow-up.  Unable to do brain MRI due to ICD EXAM: CT HEAD WITHOUT CONTRAST TECHNIQUE: Contiguous axial images were obtained from the base of  the skull through the vertex without intravenous contrast. RADIATION DOSE REDUCTION: This exam was performed according to the departmental dose-optimization program which includes automated exposure control, adjustment of the mA and/or kV according to patient size and/or use of iterative reconstruction technique. COMPARISON:  Yesterday FINDINGS: Brain: Indistinct left thalamic infarct, again age indeterminate. No acute hemorrhage or visible cortex infarct. No mass, hydrocephalus, or collection. Vascular: No hyperdense vessel or unexpected calcification. Skull: No acute or aggressive finding. Osteoma at the right mandibular notch. Cachectic appearance of the upper cervical soft tissues. Sinuses/Orbits: Negative IMPRESSION: Unchanged appearance of the age indeterminate lacunar infarct at the left thalamus. No new abnormality including  intracranial hemorrhage. Electronically Signed   By: Jorje Guild M.D.   On: 04/09/2022 18:48   ECHOCARDIOGRAM COMPLETE  Result Date: 04/09/2022    ECHOCARDIOGRAM REPORT   Patient Name:   Hector Byrd Date of Exam: 04/09/2022 Medical Rec #:  [id number]   Height:       67.0 in Accession #:    [id number]  Weight:       82.7 lb Date of Birth:  [birth year]   BSA:          1.389 m Patient Age:    87 years    BP:           143/81 mmHg Patient Gender: M           HR:           78 bpm. Exam Location:  Inpatient Procedure: 2D Echo, Color Doppler and Cardiac Doppler Indications:    Stroke I63.9, Elevated Troponin  History:        Patient has prior history of Echocardiogram examinations, most                 recent 02/16/2021. Cardiomyopathy and CHF, CAD, Defibrillator and                 Prior CABG, PAD, Arrythmias:Tachycardia, Signs/Symptoms:Dyspnea                 and Shortness of Breath; Risk Factors:Dyslipidemia and Current                 Smoker. Kidney Cancer.  Sonographer:    Ronny Flurry Referring Phys: [id number] TIMOTHY S OPYD  Sonographer Comments: Image acquisition challenging due to  patient body habitus. IMPRESSIONS  1. No left ventricular thrombus is seen. Left ventricular ejection fraction, by estimation, is 30 to 35%. The left ventricle has moderately decreased function. The left ventricle demonstrates regional wall motion abnormalities (see scoring diagram/findings for description). Left ventricular diastolic parameters are consistent with Grade I diastolic dysfunction (impaired relaxation). Basal inferoposterior dyskinesis/aneurysm is present. The basal anterior wall and anterior septum are hypokinetic, although the apex contracts normally, consistent with high grade stenosis or occlusion of the native coronary with patent distal bypass graft to the LAD artery.  2. Right ventricular systolic function is mildly reduced. The right ventricular size is normal.  3. The liver appears small. There is intestine anterior to the liver in the subcostal views.  4. The mitral valve is normal in structure. Mild mitral valve regurgitation.  5. There is partial right-left aortic cusp fusion with an incomplete raphe. The aortic valve is bicuspid. There is mild calcification of the aortic valve. There is mild thickening of the aortic valve. Aortic valve regurgitation is mild to moderate. Aortic valve sclerosis is present, with no evidence of aortic valve stenosis.  6. Aortic dilatation noted. There is borderline dilatation of the aortic root, measuring 38 mm. Comparison(s): The left ventricular function is worsened. The left ventricular wall motion worse. There is worsened function in the basal anterior wall, with preserved apical contractility. 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 Conclusion(s)/Recommendation(s): If cardioembolic stroke is strongly suspected, recommend repeat imaging with Definity contrast. FINDINGS  Left Ventricle: No left ventricular thrombus is seen. Left ventricular ejection fraction, by estimation, is 30 to 35%. The left ventricle has moderately decreased function. The left  ventricle demonstrates regional wall motion abnormalities. The left ventricular internal cavity size was normal in size. There is no left ventricular hypertrophy. Left ventricular diastolic parameters are consistent  with Grade I diastolic dysfunction (impaired relaxation). Normal left ventricular filling pressure.  LV Wall Scoring: The inferior wall and mid inferolateral segment are aneurysmal. The basal inferolateral segment is akinetic. The anterior wall, antero-lateral wall, mid inferoseptal segment, and basal inferoseptal segment are hypokinetic. The entire anterior septum and entire apex are normal. Basal inferoposterior dyskinesis/aneurysm is present. The basal anterior wall and anterior septum are hypokinetic, although the apex contracts normally, consistent with high grade stenosis or occlusion of the native coronary with patent distal bypass graft to the LAD artery. Right Ventricle: The right ventricular size is normal. No increase in right ventricular wall thickness. Right ventricular systolic function is mildly reduced. Left Atrium: Left atrial size was normal in size. Right Atrium: Right atrial size was normal in size. Pericardium: The liver appears small. There is intestine anterior to the liver in the subcostal views. There is no evidence of pericardial effusion. Mitral Valve: The mitral valve is normal in structure. Mild mitral valve regurgitation, with eccentric posteriorly directed jet. Tricuspid Valve: The tricuspid valve is normal in structure. Tricuspid valve regurgitation is mild. Aortic Valve: There is partial right-left aortic cusp fusion with an incomplete raphe. The aortic valve is bicuspid. There is mild calcification of the aortic valve. There is mild thickening of the aortic valve. Aortic valve regurgitation is mild to moderate. Aortic valve sclerosis is present, with no evidence of aortic valve stenosis. Aortic valve mean gradient measures 2.5 mmHg. Aortic valve peak gradient measures  5.0 mmHg. Aortic valve area, by VTI measures 2.47 cm. Pulmonic Valve: The pulmonic valve was normal in structure. Pulmonic valve regurgitation is trivial. Aorta: Aortic dilatation noted. There is borderline dilatation of the aortic root, measuring 38 mm. IAS/Shunts: No atrial level shunt detected by color flow Doppler. Additional Comments: A device lead is visualized.  LEFT VENTRICLE PLAX 2D LVIDd:         4.20 cm   Diastology LVIDs:         3.60 cm   LV e' medial:  4.22 cm/s LV PW:         0.85 cm   LV e' lateral: 4.30 cm/s LV IVS:        0.90 cm LVOT diam:     2.30 cm LV SV:         51 LV SV Index:   37 LVOT Area:     4.15 cm  RIGHT VENTRICLE RV S prime:     6.81 cm/s TAPSE (M-mode): 0.9 cm LEFT ATRIUM         Index LA diam:    2.60 cm 1.87 cm/m  AORTIC VALVE AV Area (Vmax):    2.69 cm AV Area (Vmean):   2.53 cm AV Area (VTI):     2.47 cm AV Vmax:           112.00 cm/s AV Vmean:          73.600 cm/s AV VTI:            0.207 m AV Peak Grad:      5.0 mmHg AV Mean Grad:      2.5 mmHg LVOT Vmax:         72.40 cm/s LVOT Vmean:        44.850 cm/s LVOT VTI:          0.123 m LVOT/AV VTI ratio: 0.60  AORTA Ao Root diam: 3.80 cm Ao Asc diam:  3.60 cm TRICUSPID VALVE TR Peak grad:   12.2 mmHg TR Vmax:  175.00 cm/s  SHUNTS Systemic VTI:  0.12 m Systemic Diam: 2.30 cm Mihai Croitoru MD Electronically signed by Sanda Klein MD Signature Date/Time: 04/09/2022/2:26:17 PM    Final    CT ABDOMEN PELVIS W CONTRAST  Result Date: 04/09/2022 CLINICAL DATA:  Abdominal pain, acute, nonlocalized abdominal pain, unintentional weight loss EXAM: CT ABDOMEN AND PELVIS WITH CONTRAST TECHNIQUE: Multidetector CT imaging of the abdomen and pelvis was performed using the standard protocol following bolus administration of intravenous contrast. RADIATION DOSE REDUCTION: This exam was performed according to the departmental dose-optimization program which includes automated exposure control, adjustment of the mA and/or kV according  to patient size and/or use of iterative reconstruction technique. CONTRAST:  22mL OMNIPAQUE IOHEXOL 350 MG/ML SOLN COMPARISON:  CT angiography chest 03/08/2021, CT abdomen pelvis 11/22/2012 FINDINGS: Lower chest: Emphysematous changes. Interval development of scattered pulmonary nodules and subcentimeter cavitary lesions. As an example a 0.4 cm right middle lobe pulmonary nodule (5:14) and a left lower lobe 1.2 by 0.9 cm cavitary lesion (5:16). Partially visualized cardiac defibrillator lead. Hepatobiliary: No focal liver abnormality. No gallstones, gallbladder wall thickening, or pericholecystic fluid. No biliary dilatation. Pancreas: Markedly diffusely atrophic with interval development of an irregular enhancing and cystic mass measuring approximately 5 cm best visualized on coronal imaging (6:34) along the pancreatic body. No surrounding inflammatory changes. Likely main pancreatic duct dilatation associated with the mass given fluid density tubular like appearance of the pancreas just proximal to the mass. Spleen: Normal in size. Nonspecific 1.6 cm hypodense lesion. Nonspecific subcentimeter splenic lesion. Adrenals/Urinary Tract: No adrenal nodule bilaterally. Bilateral kidneys enhance symmetrically. Subcentimeter hypodensities too small characterize-no further follow-up indicated. No hydronephrosis. No hydroureter. The urinary bladder is unremarkable. Stomach/Bowel: Stomach is within normal limits. No evidence of bowel wall thickening or dilatation. Appendix appears normal. 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 Patent right external iliac artery stent. Severe calcified and noncalcified atherosclerotic plaque of the aorta and its branches. No abdominal, pelvic, or inguinal lymphadenopathy. Reproductive: Other: Right upper lobe peritoneal fat stranding and nodularity with as an example a 0.6 x 0.6 cm nodule (3:17). No intraperitoneal free fluid. No  intraperitoneal free gas. No organized fluid collection. Musculoskeletal: No abdominal wall hernia or abnormality. No suspicious lytic or blastic osseous lesions. No acute displaced fracture. Multilevel degenerative changes of the spine. Levoscoliosis thoracolumbar spine centered at the L2-L3 level. IMPRESSION: 1. Interval development of peritoneal carcinomatosis with a difficult to visualize irregular cystic and enhancing mass measuring approximately 5 cm along the pancreatic body. Markedly limited evaluation due to lack of intraperitoneal fat. When the patient is clinically stable and able to follow directions and hold their breath (preferably as an outpatient) further evaluation with dedicated abdominal MRI pancreatic protocol should be considered. 2. Scattered pulmonary nodules and subcentimeter cavitary lesions. Recommend CT chest with intravenous contrast for further evaluation. 3. 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 Recommend follow-up ultrasound every 3 years. This recommendation follows ACR consensus guidelines: White Paper of the ACR Incidental Findings Committee II on Vascular Findings. J Am Coll Radiol 2013; 10:789-794. Electronically Signed   By: Iven Finn M.D.   On: 04/09/2022 01:20   CT Head Wo Contrast  Result Date: 04/08/2022 CLINICAL DATA:  Head trauma, minor. Generalized weakness. Multiple falls. EXAM: CT HEAD WITHOUT CONTRAST TECHNIQUE: Contiguous axial images were obtained from the base of the skull through the vertex without intravenous contrast. RADIATION DOSE REDUCTION: This exam was performed according to the departmental dose-optimization program which includes  automated exposure control, adjustment of the mA and/or kV according to patient size and/or use of iterative reconstruction technique. COMPARISON:  Head CT 08/14/2020. FINDINGS: Brain: No acute intracranial hemorrhage. Mild chronic small-vessel disease. 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 No hydrocephalus or extra-axial collection. No mass effect or midline shift. Vascular: No hyperdense vessel or unexpected calcification. Skull: No calvarial fracture or suspicious bone lesion. Skull base is unremarkable. Sinuses/Orbits: Unremarkable. Other: None. IMPRESSION: 1. No acute intracranial hemorrhage. 2. 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 3. Mild chronic small-vessel disease. Electronically Signed   By: Emmit Alexanders M.D.   On: 04/08/2022 18:22   DG Chest 2 View  Result Date: 04/08/2022 CLINICAL DATA:  Generalized weakness. Multiple falls. Infection evaluation. EXAM: CHEST - 2 VIEW COMPARISON:  Chest CT 03/08/2021.  Radiographs 08/14/2020. FINDINGS: The heart size and mediastinal contours are stable status post median sternotomy. Left subclavian AICD lead projects over the right ventricular apex. The lungs appear hyperinflated but clear. There is no pleural effusion or pneumothorax. No acute osseous findings are seen. There are advanced degenerative changes at both shoulders. Chronic fracture of the left humeral neck may be incompletely healed. Grossly stable chronic compression deformities near the thoracolumbar junction. IMPRESSION: No evidence of acute cardiopulmonary process. Chronic obstructive pulmonary disease. Electronically Signed   By: Richardean Sale M.D.   On: 04/08/2022 18:17    Microbiology: No results found for this or any previous visit (from the past 240 hour(s)).   Labs: Basic Metabolic Panel: Recent Labs  Lab 04/08/22 2117 04/08/22 2349 04/09/22 0426 04/10/22 0701 04/11/22 0315  NA 136 138  --  139 135  K 4.3 4.0  --  3.9 3.9  CL 96* 99  --  100 102  CO2 25 23  --  27 22  GLUCOSE 117* 103*  --  122* 189*  BUN 49* 50*  --  44* 42*  CREATININE 1.14 1.07  --  0.92 0.83  CALCIUM 9.4 9.4  --  9.1 8.6*  MG  --   --   --  2.2  --   PHOS  --   --  3.6  --   --    Liver Function Tests: Recent Labs  Lab 04/08/22 2117  04/08/22 2349 04/10/22 0701 04/11/22 0315  AST 34 QUANTITY NOT SUFFICIENT, UNABLE TO PERFORM TEST 26 25  ALT 26 QUANTITY NOT SUFFICIENT, UNABLE TO PERFORM TEST 24 22  ALKPHOS 76 85 78 76  BILITOT 1.7* QUANTITY NOT SUFFICIENT, UNABLE TO PERFORM TEST 1.5* 1.1  PROT 6.0* 6.5 6.0* 5.4*  ALBUMIN 3.4* 3.6 3.3* 3.1*   No results for input(s): "LIPASE", "AMYLASE" in the last 168 hours. Recent Labs  Lab 04/08/22 2117  AMMONIA 25   CBC: Recent Labs  Lab 04/08/22 2117 04/09/22 0426 04/10/22 0701 04/11/22 0315  WBC 8.7 8.6 8.9 10.3  NEUTROABS 7.0  --   --   --   HGB 13.7 13.1 14.3 13.2  HCT 39.8 38.2* 40.9 39.9  MCV 92.1 91.8 91.3 94.1  PLT 235 231 190 194   Cardiac Enzymes: Recent Labs  Lab 04/08/22 2349  CKTOTAL 140   BNP: BNP (last 3 results) No results for input(s): "BNP" in the last 8760 hours.  ProBNP (last 3 results) No results for input(s): "PROBNP" in the last 8760 hours.  CBG: No results for input(s): "GLUCAP" in the last 168 hours.  [facility] physician (or establish a relationship with a primary care physician  if you do not have one) for your post hospital discharge needs so that they can reassess your need for medications and monitor your lab values.     Signed:  Florencia Reasons MD, PhD, FACP  [facility] 04/13/2022, 11:59 AM

## 2022-04-13 NOTE — Progress Notes (Signed)
   04/13/22 1253  Spiritual Encounters  Type of Visit Initial  Care provided to: Patient;Family  Conversation partners present during encounter Nurse  Referral source Family  OnCall Visit Yes   Chaplain responded to a patient wanting an advanced directive notarized. Chaplain worked with other [employer] and visitors to facilitate this. Chaplain introduced spiritual care services. Spiritual care services available as needed.   Jeri Lager, Chaplain 04/13/22

## 2022-04-13 NOTE — Progress Notes (Addendum)
Patient's sisters complaining that patient lost his wallet during this hospital stay. I called security but they confirmed  that they have no belongings of such with them. There was no documentation in EPIC patient came to the hospital with wallet.

## 2022-04-13 NOTE — TOC Transition Note (Signed)
Transition of Care [facility]) - CM/SW Discharge Note   Patient Details  Name: Hector Byrd MRN: [id number] Date of Birth: 16-Mar-1955  [facility]) CM/SW Contact:  Ina Homes, [place] Phone Number: 04/13/2022, 11:19 AM   Clinical Narrative:     Pt d/c to [facility].    Final next level of care: [place] Barriers to Discharge: Hospice Bed not available, Continued Medical Work up   Patient Goals and CMS Choice CMS Medicare.gov Compare Post Acute Care list provided to:: Patient Choice offered to / list presented to : Patient  Discharge Placement                Patient chooses bed at:  [facility]) Patient to be transferred to facility by: [facility]   Patient and family notified of of transfer: 04/13/22  Discharge Plan and Services Additional resources added to the After Visit Summary for   In-house Referral: Hospice / Palliative Care   Post Acute Care Choice: Residential Hospice Bed                               Social Determinants of Health (SDOH) Interventions SDOH Screenings   Depression (PHQ2-9): Low Risk  (04/27/2020)  Tobacco Use: High Risk (04/10/2022)     Readmission Risk Interventions     No data to display

## 2022-04-16 ENCOUNTER — Telehealth: Payer: Self-pay

## 2022-04-16 LAB — CANCER ANTIGEN 19-9: CA 19-9: 4847 U/mL — ABNORMAL HIGH (ref 0–35)

## 2022-04-16 NOTE — Telephone Encounter (Signed)
Attempted to contact patient to plug remote monitor in. No answer, unable to leave VM d/t not set up yet. Sent [name].

## 2022-04-16 NOTE — Telephone Encounter (Signed)
Spoke to patients care giver at [facility], states they have contacted [employer] and do not need further assistance. Appreciative of call back.

## 2022-04-16 NOTE — Telephone Encounter (Signed)
[facility] called today wanting the number to [employer] because they want to deactivate his ICD since he is dying. I have given them the number per wife request.

## 2022-04-17 NOTE — Progress Notes (Signed)
Remote ICD transmission.   

## 2022-04-22 ENCOUNTER — Telehealth: Payer: Self-pay

## 2022-04-22 NOTE — Telephone Encounter (Signed)
Hector Byrd called to advise the pt passed away yesterday at [facility] place.

## 2022-05-15 DEATH — deceased

## 2022-05-16 NOTE — Progress Notes (Signed)
Remote ICD transmission.   

## 2024-01-22 ENCOUNTER — Other Ambulatory Visit (HOSPITAL_COMMUNITY): Payer: Self-pay
